# Patient Record
Sex: Female | Born: 1935 | Race: White | Hispanic: No | Smoking: Never smoker
Health system: Southern US, Community
[De-identification: ages and names within clinical notes are randomized; demographics above are authoritative.]

## PROBLEM LIST (undated history)

## (undated) DIAGNOSIS — Z8619 Personal history of other infectious and parasitic diseases: Secondary | ICD-10-CM

## (undated) DIAGNOSIS — D3502 Benign neoplasm of left adrenal gland: Secondary | ICD-10-CM

## (undated) DIAGNOSIS — R7989 Other specified abnormal findings of blood chemistry: Secondary | ICD-10-CM

## (undated) DIAGNOSIS — D649 Anemia, unspecified: Secondary | ICD-10-CM

## (undated) DIAGNOSIS — N184 Chronic kidney disease, stage 4 (severe): Secondary | ICD-10-CM

## (undated) DIAGNOSIS — K219 Gastro-esophageal reflux disease without esophagitis: Secondary | ICD-10-CM

## (undated) DIAGNOSIS — K449 Diaphragmatic hernia without obstruction or gangrene: Secondary | ICD-10-CM

## (undated) DIAGNOSIS — M48061 Spinal stenosis, lumbar region without neurogenic claudication: Secondary | ICD-10-CM

## (undated) DIAGNOSIS — J302 Other seasonal allergic rhinitis: Secondary | ICD-10-CM

## (undated) DIAGNOSIS — K529 Noninfective gastroenteritis and colitis, unspecified: Secondary | ICD-10-CM

## (undated) DIAGNOSIS — N2889 Other specified disorders of kidney and ureter: Secondary | ICD-10-CM

## (undated) DIAGNOSIS — I10 Essential (primary) hypertension: Secondary | ICD-10-CM

## (undated) DIAGNOSIS — Z8719 Personal history of other diseases of the digestive system: Secondary | ICD-10-CM

## (undated) DIAGNOSIS — M810 Age-related osteoporosis without current pathological fracture: Secondary | ICD-10-CM

## (undated) DIAGNOSIS — B9681 Helicobacter pylori [H. pylori] as the cause of diseases classified elsewhere: Secondary | ICD-10-CM

## (undated) DIAGNOSIS — N289 Disorder of kidney and ureter, unspecified: Secondary | ICD-10-CM

## (undated) DIAGNOSIS — M150 Primary generalized (osteo)arthritis: Secondary | ICD-10-CM

## (undated) DIAGNOSIS — H9319 Tinnitus, unspecified ear: Secondary | ICD-10-CM

## (undated) DIAGNOSIS — G629 Polyneuropathy, unspecified: Secondary | ICD-10-CM

## (undated) DIAGNOSIS — K2289 Other specified disease of esophagus: Secondary | ICD-10-CM

## (undated) DIAGNOSIS — T8859XA Other complications of anesthesia, initial encounter: Secondary | ICD-10-CM

## (undated) HISTORY — PX: SHOULDER SURGERY: SHX246

## (undated) HISTORY — DX: Age-related osteoporosis without current pathological fracture: M81.0

## (undated) HISTORY — DX: Other seasonal allergic rhinitis: J30.2

## (undated) HISTORY — DX: Other specified abnormal findings of blood chemistry: R79.89

## (undated) HISTORY — PX: COLONOSCOPY: SHX174

## (undated) HISTORY — DX: Other specified disease of esophagus: K22.89

## (undated) HISTORY — PX: GANGLION CYST EXCISION: SHX1691

## (undated) HISTORY — DX: Diaphragmatic hernia without obstruction or gangrene: K44.9

## (undated) HISTORY — PX: OTHER SURGICAL HISTORY: SHX169

## (undated) HISTORY — DX: Helicobacter pylori (H. pylori) as the cause of diseases classified elsewhere: B96.81

## (undated) HISTORY — PX: VAGINAL HYSTERECTOMY: SUR661

## (undated) HISTORY — DX: Disorder of kidney and ureter, unspecified: N28.9

---

## 2004-04-22 ENCOUNTER — Ambulatory Visit: Payer: Self-pay | Admitting: Internal Medicine

## 2005-12-20 ENCOUNTER — Ambulatory Visit: Payer: Self-pay | Admitting: Internal Medicine

## 2007-02-20 ENCOUNTER — Ambulatory Visit: Payer: Self-pay | Admitting: Internal Medicine

## 2008-10-21 ENCOUNTER — Ambulatory Visit: Payer: Self-pay | Admitting: Internal Medicine

## 2009-08-06 ENCOUNTER — Ambulatory Visit: Payer: Self-pay | Admitting: Unknown Physician Specialty

## 2009-09-15 ENCOUNTER — Ambulatory Visit: Payer: Self-pay | Admitting: Unknown Physician Specialty

## 2010-10-25 ENCOUNTER — Ambulatory Visit: Payer: Self-pay | Admitting: Internal Medicine

## 2011-10-26 ENCOUNTER — Ambulatory Visit: Payer: Self-pay

## 2011-11-11 IMAGING — CT CT NECK WITH CONTRAST
2 of 4 series · 7 of 14 positions shown, 8 images · non-contrast
Comparison: none

REASON FOR EXAM: sialoadenitis
COMMENTS:

[Series 3: soft tissue with · axial · 0.53mm/px · z∈[+256,+412]mm · 4 of 88 slices shown, 5 images (1 of 2)]
[im 18/88  soft-tissue]
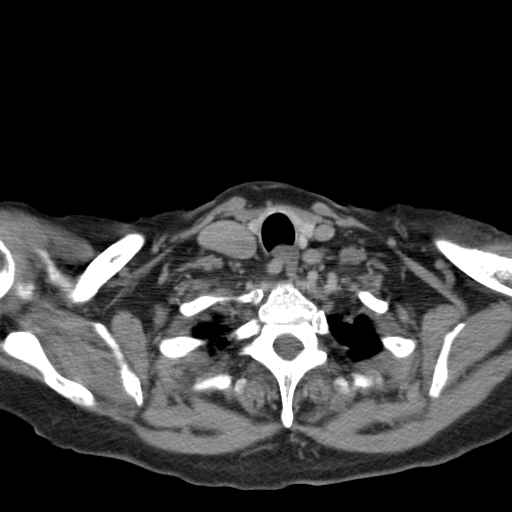
[im 18/88  bone]
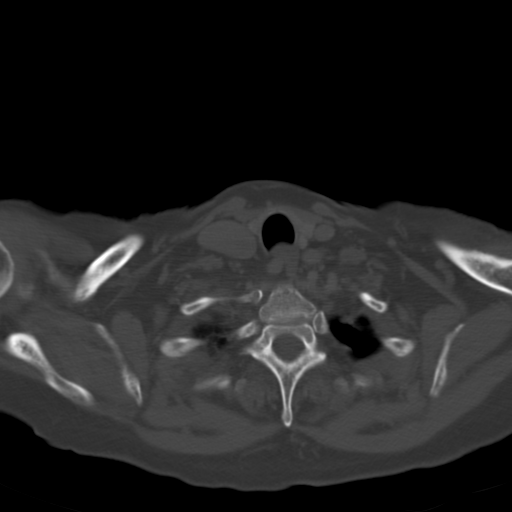
[im 35/88  bone]
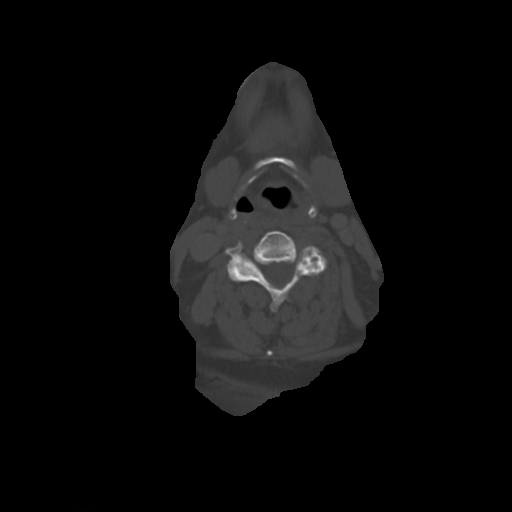
[im 53/88  bone]
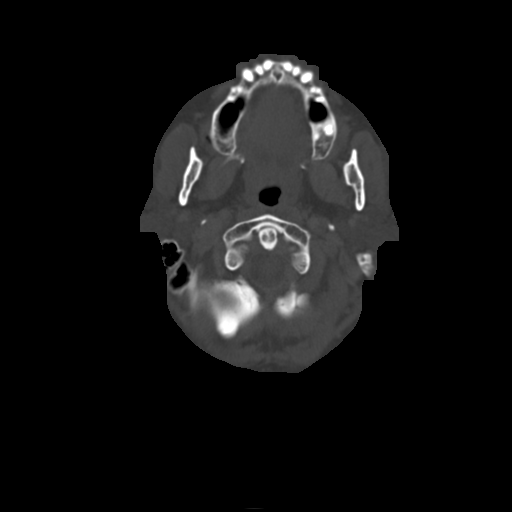
[im 70/88  bone]
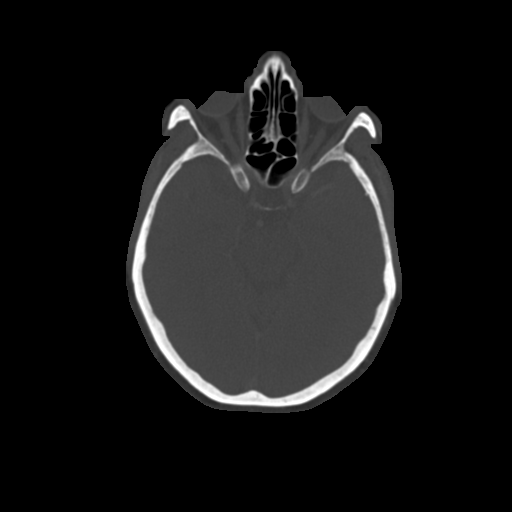

[Series 7: soft tissue with · axial · 0.53mm/px · z∈[+268,+400]mm · 3 of 86 slices shown (2 of 2)]
[im 22/86  soft-tissue]
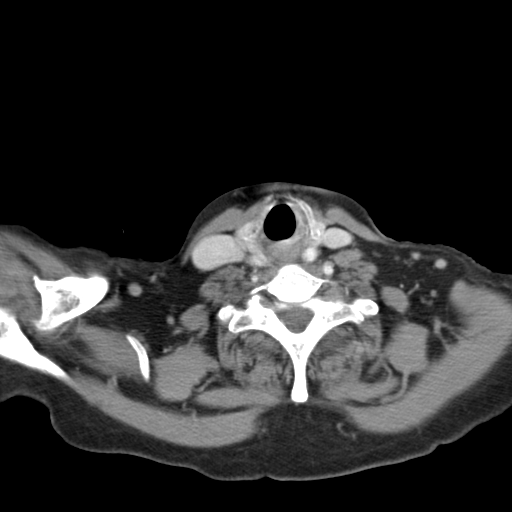
[im 43/86  soft-tissue]
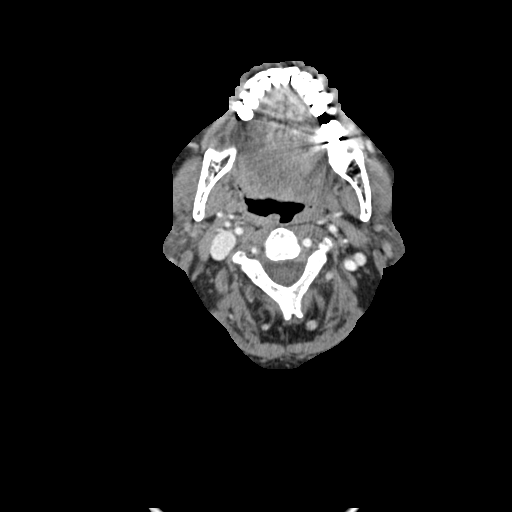
[im 64/86  soft-tissue]
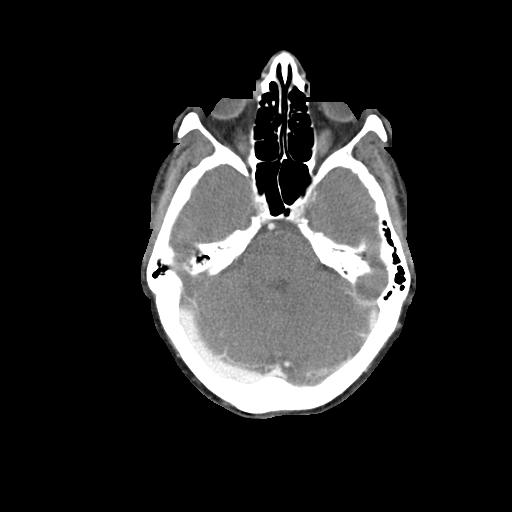

[7 of 14 positions shown; findings below may reference images not displayed]

PROCEDURE:     CT  - CT NECK WITH CONTRAST  - August 06, 2009  [DATE]

RESULT:     Non-contrast CT of the neck is performed in the standard fashion
and reconstructed at soft tissue windows in the axial plane at 3 mm slice
thickness. Subsequently scanning was performed with 70 ml of Jsovue-ROD
iodinated intravenous contrast and reconstructed at 3 mm slice thickness.
There is no previous exam for comparison.

There is a right submandibular duct stone measuring 7.0 x 3.3 mm. No
additional stones or groups of stones are identified.

Images obtained following contrast administration show a normal appearance
of the parotid and submandibular glands without significant inflammatory
stranding. The prevertebral and parapharyngeal soft tissues are normal. The
base of the brain included on the exam is unremarkable. The orbits and
sinuses appear within normal limits. Nonenlarged cervical lymph nodes are
seen bilaterally. The right internal jugular is dominant with a small left
internal jugular visualized. The included images of the thyroid show normal
enhancement without a discrete mass or abnormal calcification. The superior
mediastinum is unremarkable. The lung apices appear within normal limits.
IMPRESSION: Right submandibular gland duct stone or stones.

## 2012-11-02 ENCOUNTER — Ambulatory Visit: Payer: Self-pay

## 2014-01-01 DIAGNOSIS — R7989 Other specified abnormal findings of blood chemistry: Secondary | ICD-10-CM | POA: Insufficient documentation

## 2014-01-29 ENCOUNTER — Ambulatory Visit: Payer: Self-pay | Admitting: Internal Medicine

## 2015-01-06 ENCOUNTER — Other Ambulatory Visit: Payer: Self-pay | Admitting: Internal Medicine

## 2015-01-06 DIAGNOSIS — Z1231 Encounter for screening mammogram for malignant neoplasm of breast: Secondary | ICD-10-CM

## 2015-02-02 ENCOUNTER — Ambulatory Visit
Admission: RE | Admit: 2015-02-02 | Discharge: 2015-02-02 | Disposition: A | Discharge status: Home / Self Care | Payer: PPO | Source: Ambulatory Visit | Attending: Internal Medicine | Admitting: Internal Medicine

## 2015-02-02 DIAGNOSIS — Z1231 Encounter for screening mammogram for malignant neoplasm of breast: Secondary | ICD-10-CM | POA: Diagnosis not present

## 2016-01-13 DIAGNOSIS — H2513 Age-related nuclear cataract, bilateral: Secondary | ICD-10-CM | POA: Diagnosis not present

## 2016-01-22 DIAGNOSIS — I10 Essential (primary) hypertension: Secondary | ICD-10-CM | POA: Diagnosis not present

## 2016-01-22 DIAGNOSIS — Z0000 Encounter for general adult medical examination without abnormal findings: Secondary | ICD-10-CM | POA: Diagnosis not present

## 2016-01-22 DIAGNOSIS — E559 Vitamin D deficiency, unspecified: Secondary | ICD-10-CM | POA: Diagnosis not present

## 2016-01-22 DIAGNOSIS — E538 Deficiency of other specified B group vitamins: Secondary | ICD-10-CM | POA: Diagnosis not present

## 2016-01-22 DIAGNOSIS — Z139 Encounter for screening, unspecified: Secondary | ICD-10-CM | POA: Diagnosis not present

## 2016-01-26 DIAGNOSIS — E559 Vitamin D deficiency, unspecified: Secondary | ICD-10-CM | POA: Diagnosis not present

## 2016-01-26 DIAGNOSIS — Z0000 Encounter for general adult medical examination without abnormal findings: Secondary | ICD-10-CM | POA: Diagnosis not present

## 2016-01-26 DIAGNOSIS — I10 Essential (primary) hypertension: Secondary | ICD-10-CM | POA: Diagnosis not present

## 2016-06-21 DIAGNOSIS — M5441 Lumbago with sciatica, right side: Secondary | ICD-10-CM | POA: Diagnosis not present

## 2016-06-21 DIAGNOSIS — M9903 Segmental and somatic dysfunction of lumbar region: Secondary | ICD-10-CM | POA: Diagnosis not present

## 2016-06-21 DIAGNOSIS — M9901 Segmental and somatic dysfunction of cervical region: Secondary | ICD-10-CM | POA: Diagnosis not present

## 2016-06-21 DIAGNOSIS — M542 Cervicalgia: Secondary | ICD-10-CM | POA: Diagnosis not present

## 2016-06-23 DIAGNOSIS — M5441 Lumbago with sciatica, right side: Secondary | ICD-10-CM | POA: Diagnosis not present

## 2016-06-23 DIAGNOSIS — M542 Cervicalgia: Secondary | ICD-10-CM | POA: Diagnosis not present

## 2016-06-23 DIAGNOSIS — M9901 Segmental and somatic dysfunction of cervical region: Secondary | ICD-10-CM | POA: Diagnosis not present

## 2016-06-23 DIAGNOSIS — M9903 Segmental and somatic dysfunction of lumbar region: Secondary | ICD-10-CM | POA: Diagnosis not present

## 2016-06-29 DIAGNOSIS — M9901 Segmental and somatic dysfunction of cervical region: Secondary | ICD-10-CM | POA: Diagnosis not present

## 2016-06-29 DIAGNOSIS — M542 Cervicalgia: Secondary | ICD-10-CM | POA: Diagnosis not present

## 2016-06-29 DIAGNOSIS — M9903 Segmental and somatic dysfunction of lumbar region: Secondary | ICD-10-CM | POA: Diagnosis not present

## 2016-06-29 DIAGNOSIS — M5441 Lumbago with sciatica, right side: Secondary | ICD-10-CM | POA: Diagnosis not present

## 2016-07-05 DIAGNOSIS — M9903 Segmental and somatic dysfunction of lumbar region: Secondary | ICD-10-CM | POA: Diagnosis not present

## 2016-07-05 DIAGNOSIS — M5441 Lumbago with sciatica, right side: Secondary | ICD-10-CM | POA: Diagnosis not present

## 2016-07-05 DIAGNOSIS — M9901 Segmental and somatic dysfunction of cervical region: Secondary | ICD-10-CM | POA: Diagnosis not present

## 2016-07-05 DIAGNOSIS — M542 Cervicalgia: Secondary | ICD-10-CM | POA: Diagnosis not present

## 2016-07-07 DIAGNOSIS — M9903 Segmental and somatic dysfunction of lumbar region: Secondary | ICD-10-CM | POA: Diagnosis not present

## 2016-07-07 DIAGNOSIS — M9901 Segmental and somatic dysfunction of cervical region: Secondary | ICD-10-CM | POA: Diagnosis not present

## 2016-07-07 DIAGNOSIS — M5441 Lumbago with sciatica, right side: Secondary | ICD-10-CM | POA: Diagnosis not present

## 2016-07-07 DIAGNOSIS — M542 Cervicalgia: Secondary | ICD-10-CM | POA: Diagnosis not present

## 2016-07-14 DIAGNOSIS — M542 Cervicalgia: Secondary | ICD-10-CM | POA: Diagnosis not present

## 2016-07-14 DIAGNOSIS — M5441 Lumbago with sciatica, right side: Secondary | ICD-10-CM | POA: Diagnosis not present

## 2016-07-14 DIAGNOSIS — M9901 Segmental and somatic dysfunction of cervical region: Secondary | ICD-10-CM | POA: Diagnosis not present

## 2016-07-14 DIAGNOSIS — M9903 Segmental and somatic dysfunction of lumbar region: Secondary | ICD-10-CM | POA: Diagnosis not present

## 2016-07-25 DIAGNOSIS — M5441 Lumbago with sciatica, right side: Secondary | ICD-10-CM | POA: Diagnosis not present

## 2016-07-25 DIAGNOSIS — M9903 Segmental and somatic dysfunction of lumbar region: Secondary | ICD-10-CM | POA: Diagnosis not present

## 2016-07-25 DIAGNOSIS — M542 Cervicalgia: Secondary | ICD-10-CM | POA: Diagnosis not present

## 2016-07-25 DIAGNOSIS — M9901 Segmental and somatic dysfunction of cervical region: Secondary | ICD-10-CM | POA: Diagnosis not present

## 2016-08-02 DIAGNOSIS — M5441 Lumbago with sciatica, right side: Secondary | ICD-10-CM | POA: Diagnosis not present

## 2016-08-02 DIAGNOSIS — M542 Cervicalgia: Secondary | ICD-10-CM | POA: Diagnosis not present

## 2016-08-02 DIAGNOSIS — M9903 Segmental and somatic dysfunction of lumbar region: Secondary | ICD-10-CM | POA: Diagnosis not present

## 2016-08-02 DIAGNOSIS — M9901 Segmental and somatic dysfunction of cervical region: Secondary | ICD-10-CM | POA: Diagnosis not present

## 2016-08-09 DIAGNOSIS — M9901 Segmental and somatic dysfunction of cervical region: Secondary | ICD-10-CM | POA: Diagnosis not present

## 2016-08-09 DIAGNOSIS — M5441 Lumbago with sciatica, right side: Secondary | ICD-10-CM | POA: Diagnosis not present

## 2016-08-09 DIAGNOSIS — M9903 Segmental and somatic dysfunction of lumbar region: Secondary | ICD-10-CM | POA: Diagnosis not present

## 2016-08-09 DIAGNOSIS — M542 Cervicalgia: Secondary | ICD-10-CM | POA: Diagnosis not present

## 2016-08-16 DIAGNOSIS — M9901 Segmental and somatic dysfunction of cervical region: Secondary | ICD-10-CM | POA: Diagnosis not present

## 2016-08-16 DIAGNOSIS — M9903 Segmental and somatic dysfunction of lumbar region: Secondary | ICD-10-CM | POA: Diagnosis not present

## 2016-08-16 DIAGNOSIS — M542 Cervicalgia: Secondary | ICD-10-CM | POA: Diagnosis not present

## 2016-08-16 DIAGNOSIS — M5441 Lumbago with sciatica, right side: Secondary | ICD-10-CM | POA: Diagnosis not present

## 2016-08-23 DIAGNOSIS — M5441 Lumbago with sciatica, right side: Secondary | ICD-10-CM | POA: Diagnosis not present

## 2016-08-23 DIAGNOSIS — M542 Cervicalgia: Secondary | ICD-10-CM | POA: Diagnosis not present

## 2016-08-23 DIAGNOSIS — M9903 Segmental and somatic dysfunction of lumbar region: Secondary | ICD-10-CM | POA: Diagnosis not present

## 2016-08-23 DIAGNOSIS — M9901 Segmental and somatic dysfunction of cervical region: Secondary | ICD-10-CM | POA: Diagnosis not present

## 2016-11-21 DIAGNOSIS — M5134 Other intervertebral disc degeneration, thoracic region: Secondary | ICD-10-CM | POA: Diagnosis not present

## 2016-11-21 DIAGNOSIS — M5136 Other intervertebral disc degeneration, lumbar region: Secondary | ICD-10-CM | POA: Diagnosis not present

## 2016-11-21 DIAGNOSIS — M1611 Unilateral primary osteoarthritis, right hip: Secondary | ICD-10-CM | POA: Diagnosis not present

## 2016-11-21 DIAGNOSIS — M5416 Radiculopathy, lumbar region: Secondary | ICD-10-CM | POA: Diagnosis not present

## 2016-12-24 DIAGNOSIS — R21 Rash and other nonspecific skin eruption: Secondary | ICD-10-CM | POA: Diagnosis not present

## 2017-01-04 DIAGNOSIS — M2560 Stiffness of unspecified joint, not elsewhere classified: Secondary | ICD-10-CM | POA: Diagnosis not present

## 2017-01-04 DIAGNOSIS — M5441 Lumbago with sciatica, right side: Secondary | ICD-10-CM | POA: Diagnosis not present

## 2017-01-04 DIAGNOSIS — M5442 Lumbago with sciatica, left side: Secondary | ICD-10-CM | POA: Diagnosis not present

## 2017-01-04 DIAGNOSIS — M6281 Muscle weakness (generalized): Secondary | ICD-10-CM | POA: Diagnosis not present

## 2017-01-04 DIAGNOSIS — M5136 Other intervertebral disc degeneration, lumbar region: Secondary | ICD-10-CM | POA: Diagnosis not present

## 2017-01-10 DIAGNOSIS — M5136 Other intervertebral disc degeneration, lumbar region: Secondary | ICD-10-CM | POA: Diagnosis not present

## 2017-01-13 DIAGNOSIS — M5136 Other intervertebral disc degeneration, lumbar region: Secondary | ICD-10-CM | POA: Diagnosis not present

## 2017-01-17 DIAGNOSIS — M5136 Other intervertebral disc degeneration, lumbar region: Secondary | ICD-10-CM | POA: Diagnosis not present

## 2017-01-20 DIAGNOSIS — M5136 Other intervertebral disc degeneration, lumbar region: Secondary | ICD-10-CM | POA: Diagnosis not present

## 2017-01-24 DIAGNOSIS — R7989 Other specified abnormal findings of blood chemistry: Secondary | ICD-10-CM | POA: Diagnosis not present

## 2017-01-24 DIAGNOSIS — Z0000 Encounter for general adult medical examination without abnormal findings: Secondary | ICD-10-CM | POA: Diagnosis not present

## 2017-01-24 DIAGNOSIS — I10 Essential (primary) hypertension: Secondary | ICD-10-CM | POA: Diagnosis not present

## 2017-01-24 DIAGNOSIS — M5136 Other intervertebral disc degeneration, lumbar region: Secondary | ICD-10-CM | POA: Diagnosis not present

## 2017-01-31 DIAGNOSIS — M5136 Other intervertebral disc degeneration, lumbar region: Secondary | ICD-10-CM | POA: Diagnosis not present

## 2017-02-03 DIAGNOSIS — M5136 Other intervertebral disc degeneration, lumbar region: Secondary | ICD-10-CM | POA: Diagnosis not present

## 2017-02-15 DIAGNOSIS — R7989 Other specified abnormal findings of blood chemistry: Secondary | ICD-10-CM | POA: Diagnosis not present

## 2017-02-15 DIAGNOSIS — Z23 Encounter for immunization: Secondary | ICD-10-CM | POA: Diagnosis not present

## 2017-02-15 DIAGNOSIS — D649 Anemia, unspecified: Secondary | ICD-10-CM | POA: Diagnosis not present

## 2017-02-15 DIAGNOSIS — I10 Essential (primary) hypertension: Secondary | ICD-10-CM | POA: Diagnosis not present

## 2017-02-15 DIAGNOSIS — Z0000 Encounter for general adult medical examination without abnormal findings: Secondary | ICD-10-CM | POA: Diagnosis not present

## 2017-03-03 DIAGNOSIS — H2513 Age-related nuclear cataract, bilateral: Secondary | ICD-10-CM | POA: Diagnosis not present

## 2018-10-13 ENCOUNTER — Emergency Department: Payer: Medicare HMO

## 2018-10-13 ENCOUNTER — Other Ambulatory Visit: Payer: Self-pay

## 2018-10-13 ENCOUNTER — Emergency Department
Admission: EM | Admit: 2018-10-13 | Discharge: 2018-10-13 | Disposition: A | Discharge status: Home / Self Care | Payer: Medicare HMO | Attending: Emergency Medicine | Admitting: Emergency Medicine

## 2018-10-13 ENCOUNTER — Encounter: Payer: Self-pay | Admitting: Emergency Medicine

## 2018-10-13 DIAGNOSIS — R05 Cough: Secondary | ICD-10-CM

## 2018-10-13 DIAGNOSIS — I10 Essential (primary) hypertension: Secondary | ICD-10-CM | POA: Diagnosis not present

## 2018-10-13 DIAGNOSIS — K219 Gastro-esophageal reflux disease without esophagitis: Secondary | ICD-10-CM

## 2018-10-13 DIAGNOSIS — R079 Chest pain, unspecified: Secondary | ICD-10-CM

## 2018-10-13 DIAGNOSIS — R1013 Epigastric pain: Secondary | ICD-10-CM | POA: Diagnosis present

## 2018-10-13 DIAGNOSIS — R059 Cough, unspecified: Secondary | ICD-10-CM

## 2018-10-13 HISTORY — DX: Essential (primary) hypertension: I10

## 2018-10-13 LAB — HEPATIC FUNCTION PANEL
ALT: 17 U/L (ref 0–44)
AST: 21 U/L (ref 15–41)
Albumin: 4.1 g/dL (ref 3.5–5.0)
Alkaline Phosphatase: 59 U/L (ref 38–126)
Bilirubin, Direct: <0.1 mg/dL (ref 0.0–0.2)
Total Bilirubin: 0.5 mg/dL (ref 0.3–1.2)
Total Protein: 7.5 g/dL (ref 6.5–8.1)

## 2018-10-13 LAB — TROPONIN I (HIGH SENSITIVITY)
Troponin I (High Sensitivity): 7 ng/L (ref <18)
Troponin I (High Sensitivity): 7 ng/L (ref <18)

## 2018-10-13 LAB — CBC
HCT: 36.9 % (ref 36.0–46.0)
Hemoglobin: 12.3 g/dL (ref 12.0–15.0)
MCH: 29.7 pg (ref 26.0–34.0)
MCHC: 33.3 g/dL (ref 30.0–36.0)
MCV: 89.1 fL (ref 80.0–100.0)
Platelets: 298 10*3/uL (ref 150–400)
RBC: 4.14 MIL/uL (ref 3.87–5.11)
RDW: 14 % (ref 11.5–15.5)
WBC: 7.1 10*3/uL (ref 4.0–10.5)
nRBC: 0 % (ref 0.0–0.2)

## 2018-10-13 LAB — BASIC METABOLIC PANEL
Anion gap: 10 (ref 5–15)
BUN: 23 mg/dL (ref 8–23)
CO2: 25 mmol/L (ref 22–32)
Calcium: 9.6 mg/dL (ref 8.9–10.3)
Chloride: 106 mmol/L (ref 98–111)
Creatinine, Ser: 1.46 mg/dL — ABNORMAL HIGH (ref 0.44–1.00)
GFR calc Af Amer: 39 mL/min — ABNORMAL LOW (ref >60)
GFR calc non Af Amer: 33 mL/min — ABNORMAL LOW (ref >60)
Glucose, Bld: 113 mg/dL — ABNORMAL HIGH (ref 70–99)
Potassium: 3 mmol/L — ABNORMAL LOW (ref 3.5–5.1)
Sodium: 141 mmol/L (ref 135–145)

## 2018-10-13 LAB — LIPASE, BLOOD: Lipase: 27 U/L (ref 11–51)

## 2018-10-13 MED ORDER — PANTOPRAZOLE SODIUM 20 MG PO TBEC: 20.0000 mg | DELAYED_RELEASE_TABLET | Freq: Every day | ORAL | 1 refills | Status: DC

## 2018-10-13 NOTE — ED Notes (Signed)
Pt ambulated without assistance to the bathroom.

## 2018-10-13 NOTE — ED Provider Notes (Signed)
[facility] Emergency Department Provider Note  Time seen: 3:21 PM  I have reviewed the triage vital signs and the nursing notes.   HISTORY  Chief Complaint Chest Pain   HPI Teresa Johns is a 84 y.o. female with a past medical history of hypertension presents to the emergency department for chest discomfort/epigastric discomfort.  According to the patient she suffers from reflux, will occasionally take Pepto-Bismol or Rolaids for relief.  Patient states Thursday she was having reflux type symptoms, spit up several times and had an episode of loose stool as well per patient.  States she took Rolaids but did not feel significant relief.  Then overnight last night she developed some symptoms once again.  Patient states she stays alone and got "nervous."  She states her mother had bad reflux and blamed her reflux on her chest pain when her mother actually had a heart attack.  Patient states she became very anxious that she could be having a heart attack and not just reflux so she went to [facility] [facility] and was referred to the emergency department.  Currently she denies any discomfort.  Denies any shortness of breath at any point.  Denies any recent fever or cough.  Patient states on occasion she will have some discomfort in her epigastrium but denies any currently.  Is not taking anything for reflux on a daily basis.   Past Medical History:  Diagnosis Date  . Hypertension     There are no active problems to display for this patient.   Past Surgical History:  Procedure Laterality Date  . SHOULDER SURGERY Right   . VAGINAL HYSTERECTOMY     partial    Prior to Admission medications   Not on File    No Known Allergies  Family History  Problem Relation Age of Onset  . Breast cancer Maternal Aunt 60    Social History Social History   Tobacco Use  . Smoking status: Never Smoker  . Smokeless tobacco: Never Used  Substance Use Topics  . Alcohol use: Never     Frequency: Never  . Drug use: Never    Review of Systems Constitutional: Negative for fever. Cardiovascular: Mild chest discomfort Thursday and last night.  None currently. Respiratory: Negative for shortness of breath. Gastrointestinal: Mild upper abdominal discomfort.  Positive for nausea vomiting diarrhea on Thursday. Genitourinary: Negative for urinary compaints Musculoskeletal: Negative for musculoskeletal complaints Skin: Negative for skin complaints  Neurological: Negative for headache All other ROS negative  ____________________________________________   PHYSICAL EXAM:  VITAL SIGNS: ED Triage Vitals  Enc Vitals Group     BP 10/13/18 1243 (!) 159/70     Pulse Rate 10/13/18 1243 98     Resp 10/13/18 1243 16     Temp 10/13/18 1243 98.3 F (36.8 C)     Temp Source 10/13/18 1243 Oral     SpO2 10/13/18 1243 98 %     Weight 10/13/18 1244 160 lb (72.6 kg)     Height 10/13/18 1244 5\' 3"  (1.6 m)     Head Circumference --      Peak Flow --      Pain Score 10/13/18 1244 2     Pain Loc --      Pain Edu? --      Excl. in [place]? --    Constitutional: Alert and oriented. Well appearing and in no distress. Eyes: Normal exam ENT      Head: Normocephalic and atraumatic.  Mouth/Throat: Mucous membranes are moist. Cardiovascular: Normal rate, regular rhythm. No murmur Respiratory: Normal respiratory effort without tachypnea nor retractions. Breath sounds are clear Gastrointestinal: Soft and nontender. No distention.   Musculoskeletal: Nontender with normal range of motion in all extremities.  Neurologic:  Normal speech and language. No gross focal neurologic deficits  Skin:  Skin is warm, dry and intact.  Psychiatric: Mood and affect are normal.   ____________________________________________    EKG  EKG viewed and interpreted by myself shows a normal sinus rhythm at 98 bpm with a narrow QRS, normal axis, normal intervals, no concerning ST  changes.  ____________________________________________    RADIOLOGY  Chest x-ray appears well  ____________________________________________   INITIAL IMPRESSION / ASSESSMENT AND PLAN / ED COURSE  Pertinent labs & imaging results that were available during my care of the patient were reviewed by me and considered in my medical decision making (see chart for details).   Patient presents to the emergency department for intermittent chest/epigastric discomfort.  History of reflux symptoms.  Patient was concerned that it could have been a heart attack.  Is asking me to be discharged home however the patient is agreeable to stay for a second troponin.  Currently the patient appears well denies any symptoms at this time.  Patient's initial lab work including cardiac enzymes are negative.  Differential would include ACS, gastritis, reflux, pancreatitis gallbladder dysfunction, peptic ulcer disease.  I have added on LFTs and a lipase to the patient's lab work including a second troponin.  If the patient's labs result normal anticipate likely discharge home with PCP follow-up.  I did discuss placing the patient on a PPI for her reflux symptoms, the patient is agreeable to plan of care.  Labs including repeat troponin hepatic function panel and lipase are largely within normal limits.  Overall the patient appears extremely well denies any symptoms at this time.  We will discharge the patient home with PCP follow-up.  We will place on a PPI.  Patient agreeable to plan of care.  Teresa Johns was evaluated in Emergency Department on 10/13/2018 for the symptoms described in the history of present illness. She was evaluated in the context of the global COVID-19 pandemic, which necessitated consideration that the patient might be at risk for infection with the SARS-CoV-2 virus that causes COVID-19. Institutional protocols and algorithms that pertain to the evaluation of patients at risk for COVID-19 are in a  state of rapid change based on information released by regulatory bodies including the CDC and federal and state organizations. These policies and algorithms were followed during the patient's care in the ED.  ____________________________________________   FINAL CLINICAL IMPRESSION(S) / ED DIAGNOSES Chest pain Reflux   Minna AntisPaduchowski, Kenetha Cozza, MD 10/13/18 1635

## 2018-10-13 NOTE — ED Notes (Signed)
First Nurse Note: Pt to ED from [facility]. For chest pain/pressure. Pt is in NAD.

## 2018-10-13 NOTE — ED Triage Notes (Signed)
Pt to ED from home c/o mid chest pain since Wednesday with pressure feeling and indigestion, nausea with vomiting.  Also states pain to mid back, denies cardiac hx or thinners. Speaking in complete and coherent sentences, chest rise even and unlabored, in NAD at this time.

## 2019-07-04 DIAGNOSIS — M810 Age-related osteoporosis without current pathological fracture: Secondary | ICD-10-CM | POA: Insufficient documentation

## 2019-07-04 DIAGNOSIS — J302 Other seasonal allergic rhinitis: Secondary | ICD-10-CM | POA: Insufficient documentation

## 2019-09-06 ENCOUNTER — Ambulatory Visit: Payer: Medicare HMO | Admitting: Podiatry

## 2019-09-06 ENCOUNTER — Other Ambulatory Visit: Payer: Self-pay

## 2019-09-06 ENCOUNTER — Encounter: Payer: Self-pay | Admitting: Podiatry

## 2019-09-06 VITALS — BP 158/86 | HR 92 | Ht 63.0 in | Wt 162.0 lb

## 2019-09-06 DIAGNOSIS — L600 Ingrowing nail: Secondary | ICD-10-CM

## 2019-09-06 DIAGNOSIS — I10 Essential (primary) hypertension: Secondary | ICD-10-CM | POA: Insufficient documentation

## 2019-09-06 MED ORDER — GENTAMICIN SULFATE 0.1 % EX CREA: 1.0000 "application " | TOPICAL_CREAM | Freq: Two times a day (BID) | CUTANEOUS | 1 refills | Status: DC

## 2019-09-06 NOTE — Progress Notes (Signed)
° °  Subjective: Patient presents today for evaluation of pain to the lateral borders of bilateral great toes. Patient is concerned for possible ingrown nail.  Patient states that she has been dealing with this off and on for several years now.  Normally she trims the nails out however this last time she has been unable to and she has had increased pain and tenderness to the bilateral great toes.  Right greater than the left.  Patient presents today for further treatment and evaluation.  Past Medical History:  Diagnosis Date   Hypertension     Objective:  General: Well developed, nourished, in no acute distress, alert and oriented x3   Dermatology: Skin is warm, dry and supple bilateral.  Lateral border of the bilateral great toes appears to be erythematous with evidence of an ingrowing nail. Pain on palpation noted to the border of the nail fold. The remaining nails appear unremarkable at this time. There are no open sores, lesions.  Vascular: Dorsalis Pedis artery and Posterior Tibial artery pedal pulses palpable. No lower extremity edema noted.   Neruologic: Grossly intact via light touch bilateral.  Musculoskeletal: Muscular strength within normal limits in all groups bilateral. Normal range of motion noted to all pedal and ankle joints.   Assesement: #1 Paronychia with ingrowing nail lateral border bilateral great toes #2 Pain in toe #3 Incurvated nail  Plan of Care:  1. Patient evaluated.  2. Discussed treatment alternatives and plan of care. Explained nail avulsion procedure and post procedure course to patient. 3. Patient opted for permanent partial nail avulsion of the lateral border bilateral great toes.  4. Prior to procedure, local anesthesia infiltration utilized using 3 ml of a 50:50 mixture of 2% plain lidocaine and 0.5% plain marcaine in a normal hallux block fashion and a betadine prep performed.  5. Partial permanent nail avulsion with chemical matrixectomy performed  using 3x30sec applications of phenol followed by alcohol flush.  6. Light dressing applied. 7.  Prescription for gentamicin cream  8.  Return to clinic 2 weeks.  Felecia Shelling, DPM [facility]  Dr. Felecia Shelling, DPM    [address]                                        [place], [place] [address]                Office [phone]  Fax [phone]

## 2019-09-24 ENCOUNTER — Ambulatory Visit: Payer: Medicare HMO | Admitting: Podiatry

## 2019-09-24 ENCOUNTER — Other Ambulatory Visit: Payer: Self-pay

## 2019-09-24 DIAGNOSIS — L600 Ingrowing nail: Secondary | ICD-10-CM | POA: Diagnosis not present

## 2019-09-24 NOTE — Progress Notes (Signed)
   Subjective: 85 y.o. female presents today status post permanent nail avulsion procedure of the lateral border of the bilateral great toes that was performed on 09/06/2019.  Patient states she is doing much better.  She has been soaking her feet and applying antibiotic cream as directed.  No new complaints at this time  Past Medical History:  Diagnosis Date  . Hypertension     Objective: Skin is warm, dry and supple. Nail and respective nail fold appears to be healing appropriately. Open wound to the associated nail fold with a granular wound base and moderate amount of fibrotic tissue. Minimal drainage noted. Mild erythema around the periungual region likely due to phenol chemical matricectomy.  Assessment: #1 postop permanent partial nail avulsion lateral border bilateral great toes #2 open wound periungual nail fold of respective digit.   Plan of care: #1 patient was evaluated  #2 debridement of open wound was performed to the periungual border of the respective toe using a currette. Antibiotic ointment and Band-Aid was applied. #3 patient is to return to clinic on a PRN basis.   Felecia Shelling, DPM [facility]  Dr. Felecia Shelling, DPM    [address]                                        [place], [place] [address]                Office [phone]  Fax [phone]

## 2020-07-06 ENCOUNTER — Other Ambulatory Visit: Payer: Self-pay

## 2020-07-06 ENCOUNTER — Other Ambulatory Visit: Payer: Self-pay | Admitting: Internal Medicine

## 2020-07-06 ENCOUNTER — Ambulatory Visit
Admission: RE | Admit: 2020-07-06 | Discharge: 2020-07-06 | Disposition: A | Discharge status: Home / Self Care | Payer: Medicare HMO | Source: Ambulatory Visit | Attending: Internal Medicine | Admitting: Internal Medicine

## 2020-07-06 DIAGNOSIS — M7989 Other specified soft tissue disorders: Secondary | ICD-10-CM | POA: Diagnosis present

## 2020-07-06 DIAGNOSIS — M79605 Pain in left leg: Secondary | ICD-10-CM | POA: Diagnosis not present

## 2020-07-07 ENCOUNTER — Other Ambulatory Visit: Payer: Self-pay | Admitting: Internal Medicine

## 2020-07-07 DIAGNOSIS — M79605 Pain in left leg: Secondary | ICD-10-CM

## 2020-07-07 DIAGNOSIS — M7989 Other specified soft tissue disorders: Secondary | ICD-10-CM

## 2020-10-14 ENCOUNTER — Other Ambulatory Visit (HOSPITAL_COMMUNITY): Payer: Self-pay | Admitting: Physician Assistant

## 2020-10-14 ENCOUNTER — Other Ambulatory Visit: Payer: Self-pay | Admitting: Physician Assistant

## 2020-10-14 DIAGNOSIS — R1310 Dysphagia, unspecified: Secondary | ICD-10-CM

## 2020-10-22 ENCOUNTER — Other Ambulatory Visit: Payer: Self-pay

## 2020-10-22 ENCOUNTER — Ambulatory Visit
Admission: RE | Admit: 2020-10-22 | Discharge: 2020-10-22 | Disposition: A | Discharge status: Home / Self Care | Payer: Medicare HMO | Source: Ambulatory Visit | Attending: Physician Assistant | Admitting: Physician Assistant

## 2020-10-22 DIAGNOSIS — R1310 Dysphagia, unspecified: Secondary | ICD-10-CM | POA: Insufficient documentation

## 2020-11-19 ENCOUNTER — Encounter: Payer: Self-pay | Admitting: *Deleted

## 2020-11-20 ENCOUNTER — Ambulatory Visit
Admission: RE | Admit: 2020-11-20 | Discharge: 2020-11-20 | Disposition: A | Discharge status: Home / Self Care | Payer: Medicare HMO | Attending: Gastroenterology | Admitting: Gastroenterology

## 2020-11-20 ENCOUNTER — Ambulatory Visit: Payer: Medicare HMO | Admitting: Registered Nurse

## 2020-11-20 ENCOUNTER — Encounter
Admission: RE | Disposition: A | Discharge status: Home / Self Care | Payer: Self-pay | Source: Home / Self Care | Attending: Gastroenterology

## 2020-11-20 ENCOUNTER — Encounter: Payer: Self-pay | Admitting: *Deleted

## 2020-11-20 ENCOUNTER — Other Ambulatory Visit: Payer: Self-pay

## 2020-11-20 DIAGNOSIS — K2950 Unspecified chronic gastritis without bleeding: Secondary | ICD-10-CM | POA: Insufficient documentation

## 2020-11-20 DIAGNOSIS — K224 Dyskinesia of esophagus: Secondary | ICD-10-CM | POA: Diagnosis not present

## 2020-11-20 DIAGNOSIS — K449 Diaphragmatic hernia without obstruction or gangrene: Secondary | ICD-10-CM | POA: Diagnosis not present

## 2020-11-20 DIAGNOSIS — B9681 Helicobacter pylori [H. pylori] as the cause of diseases classified elsewhere: Secondary | ICD-10-CM | POA: Insufficient documentation

## 2020-11-20 DIAGNOSIS — R634 Abnormal weight loss: Secondary | ICD-10-CM | POA: Diagnosis present

## 2020-11-20 DIAGNOSIS — Z79899 Other long term (current) drug therapy: Secondary | ICD-10-CM | POA: Diagnosis not present

## 2020-11-20 DIAGNOSIS — R1310 Dysphagia, unspecified: Secondary | ICD-10-CM | POA: Diagnosis present

## 2020-11-20 HISTORY — DX: Noninfective gastroenteritis and colitis, unspecified: K52.9

## 2020-11-20 HISTORY — PX: ESOPHAGOGASTRODUODENOSCOPY: SHX5428

## 2020-11-20 HISTORY — DX: Tinnitus, unspecified ear: H93.19

## 2020-11-20 HISTORY — DX: Personal history of other infectious and parasitic diseases: Z86.19

## 2020-11-20 SURGERY — EGD (ESOPHAGOGASTRODUODENOSCOPY)
Anesthesia: General

## 2020-11-20 MED ORDER — LIDOCAINE HCL (CARDIAC) PF 100 MG/5ML IV SOSY
PREFILLED_SYRINGE | INTRAVENOUS | Status: DC | PRN
  Administered 2020-11-20: 40 mg via INTRAVENOUS

## 2020-11-20 MED ORDER — SODIUM CHLORIDE 0.9 % IV SOLN: INTRAVENOUS | Status: DC

## 2020-11-20 MED ORDER — PROPOFOL 10 MG/ML IV BOLUS
INTRAVENOUS | Status: DC | PRN
  Administered 2020-11-20: 10 mg via INTRAVENOUS
  Administered 2020-11-20: 70 mg via INTRAVENOUS

## 2020-11-20 MED ORDER — PROPOFOL 500 MG/50ML IV EMUL
INTRAVENOUS | Status: DC | PRN
  Administered 2020-11-20: 150 ug/kg/min via INTRAVENOUS

## 2020-11-20 NOTE — Anesthesia Postprocedure Evaluation (Signed)
Anesthesia Post Note  Patient: Teresa Johns  Procedure(s) Performed: ESOPHAGOGASTRODUODENOSCOPY (EGD)  Patient location during evaluation: Endoscopy Anesthesia Type: General Level of consciousness: awake and alert Pain management: pain level controlled Vital Signs Assessment: post-procedure vital signs reviewed and stable Respiratory status: spontaneous breathing, nonlabored ventilation, respiratory function stable and patient connected to nasal cannula oxygen Cardiovascular status: blood pressure returned to baseline and stable Postop Assessment: no apparent nausea or vomiting Anesthetic complications: no   No notable events documented.   Last Vitals:  Vitals:   11/20/20 1126 11/20/20 1211  BP: 130/60 (!) 97/52  Pulse: 97 74  Resp: 20 18  Temp: (!) 35.9 C (!) 35.9 C  SpO2: 98% 97%    Last Pain:  Vitals:   11/20/20 1211  TempSrc: Temporal  PainSc: 0-No pain                 Johny Blamer

## 2020-11-20 NOTE — Interval H&P Note (Signed)
History and Physical Interval Note: Preprocedure H&P from 11/20/20  was reviewed and there was no interval change after seeing and examining the patient.  Written consent was obtained from the patient after discussion of risks, benefits, and alternatives. Patient has consented to proceed with Esophagogastroduodenoscopy with possible intervention   11/20/2020 11:46 AM  Teresa Johns  has presented today for surgery, with the diagnosis of Abnormal weight loss (R63.4) Dysphagia, unspecified type (R13.10) Gastroesophageal reflux disease, unspecified whether esophagitis present (K21.9).  The various methods of treatment have been discussed with the patient and family. After consideration of risks, benefits and other options for treatment, the patient has consented to  Procedure(s): ESOPHAGOGASTRODUODENOSCOPY (EGD) (N/A) as a surgical intervention.  The patient's history has been reviewed, patient examined, no change in status, stable for surgery.  I have reviewed the patient's chart and labs.  Questions were answered to the patient's satisfaction.     Jaynie Collins

## 2020-11-20 NOTE — Op Note (Addendum)
[facility] Gastroenterology Patient Name: Teresa Johns Procedure Date: 11/20/2020 11:35 AM MRN: [id number] Account #: [id number] Date of Birth: 1934/08/21 Admit Type: Outpatient Age: 86 Room: [facility] ENDO ROOM 1 Gender: Female Note Status: Finalized Instrument Name: Upper Endoscope [id number] Procedure:             Upper GI endoscopy Indications:           Dysphagia Providers:             Jaynie Collins DO, DO Referring MD:          Barbette Reichmann, MD (Referring MD) Medicines:             Monitored Anesthesia Care Complications:         No immediate complications. Estimated blood loss: None. Procedure:             Pre-Anesthesia Assessment:                        - Prior to the procedure, a History and Physical was                         performed, and patient medications and allergies were                         reviewed. The patient is competent. The risks and                         benefits of the procedure and the sedation options and                         risks were discussed with the patient. All questions                         were answered and informed consent was obtained.                         Patient identification and proposed procedure were                         verified by the physician, the nurse, the anesthetist                         and the technician in the pre-procedure area. Mental                         Status Examination: alert and oriented. Airway                         Examination: normal oropharyngeal airway and neck                         mobility. Respiratory Examination: clear to                         auscultation. CV Examination: RRR, no murmurs, no S3                         or S4. Prophylactic Antibiotics: The patient does not  require prophylactic antibiotics. Prior                         Anticoagulants: The patient has taken no previous                         anticoagulant or  antiplatelet agents. ASA Grade                         Assessment: II - A patient with mild systemic disease.                         After reviewing the risks and benefits, the patient                         was deemed in satisfactory condition to undergo the                         procedure. The anesthesia plan was to use monitored                         anesthesia care (MAC). Immediately prior to                         administration of medications, the patient was                         re-assessed for adequacy to receive sedatives. The                         heart rate, respiratory rate, oxygen saturations,                         blood pressure, adequacy of pulmonary ventilation, and                         response to care were monitored throughout the                         procedure. The physical status of the patient was                         re-assessed after the procedure.                        After obtaining informed consent, the endoscope was                         passed under direct vision. Throughout the procedure,                         the patient's blood pressure, pulse, and oxygen                         saturations were monitored continuously. The                         Endosonoscope was introduced through the mouth, and  advanced to the second part of duodenum. The upper GI                         endoscopy was accomplished without difficulty. The                         patient tolerated the procedure well. Findings:      The duodenal bulb, first portion of the duodenum and second portion of       the duodenum were normal. Estimated blood loss: none.      Localized moderate inflammation characterized by congestion (edema) was       found in the entire examined stomach. Biopsies were taken with a cold       forceps for Helicobacter pylori testing. Biopsies were taken with a cold       forceps for histology. Estimated blood loss  was minimal.      A medium-sized hiatal hernia was present. Estimated blood loss: none.      The exam of the stomach was otherwise normal.      The Z-line was regular and was found 33 cm from the incisors. Estimated       blood loss: none.      Esophagogastric landmarks were identified: the gastroesophageal junction       was found at 33 cm from the incisors.      Abnormal motility was noted in the esophagus. The cricopharyngeus was       normal. There is spasticity of the esophageal body. The distal       esophagus/lower esophageal sphincter is open. Tertiary peristaltic waves       are noted. A guidewire was placed and the scope was withdrawn. Dilation       was performed with a Savary dilator with no resistance at 51 Fr. The       dilation site was examined and showed no change. Estimated blood loss:       none.      Normal mucosa was found in the entire esophagus. Impression:            - Normal duodenal bulb, first portion of the duodenum                         and second portion of the duodenum.                        - Gastritis. Biopsied.                        - Medium-sized hiatal hernia.                        - Z-line regular, 33 cm from the incisors.                        - Esophagogastric landmarks identified.                        - Abnormal esophageal motility, consistent with                         presbyesophagus. Dilated.                        -  Normal mucosa was found in the entire esophagus. Recommendation:        - Discharge patient to home.                        - Soft diet, moisten and chew food well.                        - Continue present medications.                        - Use a proton pump inhibitor PO daily.                        - Await pathology results.                        - Return to my office as previously scheduled. Procedure Code(s):     --- Professional ---                        [phone], Esophagogastroduodenoscopy, flexible,                          transoral; with insertion of guide wire followed by                         passage of dilator(s) through esophagus over guide wire                        [id number], 59, Esophagogastroduodenoscopy, flexible,                         transoral; with biopsy, single or multiple Diagnosis Code(s):     --- Professional ---                        K29.70, Gastritis, unspecified, without bleeding                        K44.9, Diaphragmatic hernia without obstruction or                         gangrene                        K22.4, Dyskinesia of esophagus                        R13.10, Dysphagia, unspecified CPT copyright 2019 American Medical Association. All rights reserved. The codes documented in this report are preliminary and upon coder review may  be revised to meet current compliance requirements. Attending Participation:      I personally performed the entire procedure. Elfredia Nevins, DO Jaynie Collins DO, DO 11/20/2020 12:13:05 PM This report has been signed electronically. Number of Addenda: 0 Note Initiated On: 11/20/2020 11:35 AM Estimated Blood Loss:  Estimated blood loss was minimal.      [facility]

## 2020-11-20 NOTE — H&P (Signed)
[facility] Pre-Procedure H&P   Patient ID: Teresa Johns is a 86 y.o. female.  Gastroenterology Provider: Jaynie Collins, DO  PCP: Barbette Reichmann, MD  Date: 11/20/2020  HPI Ms. Kaleia Teresa Johns is a 86 y.o. female who presents today for Esophagogastroduodenoscopy for dysphagia, abdominal pain, weight loss.  Does note heartburn but was not taking ppi previously. Dysphagia to solids and large pills, but not liquids. No odynophagia. Weight loss of 8 lbs.  No other acute gi complaints.   Past Medical History:  Diagnosis Date   Colitis    History of chicken pox    Hypertension    Tinnitus     Past Surgical History:  Procedure Laterality Date   GANGLION CYST EXCISION     PAROTID STONE REMOVAL     SHOULDER SURGERY Right    VAGINAL HYSTERECTOMY     partial    Family History No h/o GI disease or malignancy  Review of Systems  Constitutional:  Positive for unexpected weight change (8 lbs lost). Negative for activity change, appetite change, fatigue and fever.  HENT:  Positive for trouble swallowing. Negative for voice change.   Respiratory:  Negative for shortness of breath and wheezing.   Cardiovascular:  Negative for chest pain and palpitations.  Gastrointestinal:  Positive for abdominal pain (epigastric, gnawing). Negative for abdominal distention, anal bleeding, blood in stool, constipation, diarrhea, nausea, rectal pain and vomiting.  Musculoskeletal:  Negative for arthralgias and myalgias.  Skin:  Negative for color change and pallor.  Neurological:  Negative for dizziness, syncope and weakness.  Psychiatric/Behavioral:  Negative for confusion.   All other systems reviewed and are negative.   Medications No current facility-administered medications on file prior to encounter.   Current Outpatient Medications on File Prior to Encounter  Medication Sig Dispense Refill   amLODipine (NORVASC) 10 MG tablet Take 10 mg by mouth 1 day or 1 dose.      busPIRone (BUSPAR) 5 MG tablet Take 5 mg by mouth 2 (two) times daily.     calcium carbonate (OSCAL) 1500 (600 Ca) MG TABS tablet Take 1,500 mg by mouth 2 (two) times daily with a meal.     Cholecalciferol 25 MCG (1000 UT) tablet Take by mouth.     Cyanocobalamin (VITAMIN B 12) 500 MCG TABS Take 1,000 mg by mouth daily.     gentamicin cream (GARAMYCIN) 0.1 % Apply 1 application topically 2 (two) times daily. 30 g 1   hydrochlorothiazide (HYDRODIURIL) 12.5 MG tablet Take 12.5 mg by mouth daily.     Multiple Vitamin (MULTI-VITAMIN) tablet Take 1 tablet by mouth daily.     ondansetron (ZOFRAN) 4 MG tablet Take 4 mg by mouth every 8 (eight) hours as needed for nausea or vomiting.     Vitamins-Lipotropics (BALANCED B-50) TABS Take 1 tablet by mouth daily.     cefUROXime (CEFTIN) 250 MG tablet Take 250 mg by mouth 2 (two) times daily. (Patient not taking: Reported on 11/20/2020)     fluticasone (FLONASE) 50 MCG/ACT nasal spray Place into the nose.     gabapentin (NEURONTIN) 100 MG capsule Take 50 mg by mouth 2 (two) times daily.     Ibuprofen 200 MG CAPS Take 600 mg by mouth once. (Patient not taking: Reported on 11/20/2020)     iron polysaccharides (NIFEREX) 150 MG capsule Take by mouth.     losartan (COZAAR) 50 MG tablet Take 50 mg by mouth daily. (Patient not taking: Reported on 11/20/2020)  pantoprazole (PROTONIX) 20 MG tablet Take 1 tablet (20 mg total) by mouth daily. 30 tablet 1   triamterene-hydrochlorothiazide (DYAZIDE) 37.5-25 MG capsule Take 1 capsule by mouth every morning.      Pertinent medications related to GI and procedure were reviewed by me with the patient prior to the procedure   Current Facility-Administered Medications:    0.9 %  sodium chloride infusion, , Intravenous, Continuous, Jaynie Collins, DO  sodium chloride         No Known Allergies Allergies were reviewed by me prior to the procedure  Objective    Vitals:   11/20/20 1126  BP: 130/60  Pulse: 97   Resp: 20  Temp: (!) 96.7 F (35.9 C)  TempSrc: Temporal  SpO2: 98%  Weight: 69.3 kg  Height: 5\' 3"  (1.6 m)     Physical Exam Vitals reviewed.  Constitutional:      General: She is not in acute distress.    Appearance: Normal appearance. She is not ill-appearing, toxic-appearing or diaphoretic.  HENT:     Head: Normocephalic and atraumatic.     Nose: Nose normal.     Mouth/Throat:     Mouth: Mucous membranes are moist.     Pharynx: Oropharynx is clear.  Eyes:     General: No scleral icterus.    Extraocular Movements: Extraocular movements intact.  Cardiovascular:     Rate and Rhythm: Normal rate and regular rhythm.     Heart sounds: Normal heart sounds. No murmur heard.   No friction rub. No gallop.  Pulmonary:     Effort: Pulmonary effort is normal. No respiratory distress.     Breath sounds: Normal breath sounds. No wheezing, rhonchi or rales.  Abdominal:     General: Bowel sounds are normal. There is no distension.     Palpations: Abdomen is soft.     Tenderness: There is no abdominal tenderness. There is no guarding or rebound.  Musculoskeletal:     Cervical back: Neck supple.     Right lower leg: No edema.     Left lower leg: No edema.  Skin:    General: Skin is warm and dry.     Coloration: Skin is not jaundiced or pale.  Neurological:     General: No focal deficit present.     Mental Status: She is alert and oriented to person, place, and time. Mental status is at baseline.  Psychiatric:        Mood and Affect: Mood normal.        Behavior: Behavior normal.        Thought Content: Thought content normal.        Judgment: Judgment normal.     Assessment:  [name] is a 86 y.o. female  who presents today for Esophagogastroduodenoscopy for dysphagia, abdominal pain, weight loss.  Plan:  Esophagogastroduodenoscopy with possible intervention today.  Esophagogastroduodenoscopy with possible biopsy, control of bleeding, polypectomy, and  interventions as necessary has been discussed with the patient/patient representative. Informed consent was obtained from the patient/patient representative after explaining the indication, nature, and risks of the procedure including but not limited to death, bleeding, perforation, missed neoplasm/lesions, cardiorespiratory compromise, and reaction to medications. Opportunity for questions was given and appropriate answers were provided. Patient/patient representative has verbalized understanding is amenable to undergoing the procedure.   91, DO  [facility]  Portions of the record may have been created with voice recognition software. Occasional wrong-word or 'sound-a-like' substitutions may  have occurred due to the inherent limitations of voice recognition software.  Read the chart carefully and recognize, using context, where substitutions may have occurred.

## 2020-11-20 NOTE — Anesthesia Preprocedure Evaluation (Addendum)
Anesthesia Evaluation  Patient identified by MRN, date of birth, ID band Patient awake    Reviewed: Allergy & Precautions, NPO status , Patient's Chart, lab work & pertinent test results  Airway Mallampati: III  TM Distance: >3 FB Neck ROM: full    Dental no notable dental hx. (+) Chipped   Pulmonary neg pulmonary ROS,    Pulmonary exam normal        Cardiovascular hypertension, negative cardio ROS Normal cardiovascular exam     Neuro/Psych negative neurological ROS  negative psych ROS   GI/Hepatic negative GI ROS, Neg liver ROS,   Endo/Other  negative endocrine ROS  Renal/GU negative Renal ROS  negative genitourinary   Musculoskeletal   Abdominal   Peds  Hematology negative hematology ROS (+)   Anesthesia Other Findings Past Medical History: No date: Colitis No date: History of chicken pox No date: Hypertension No date: Tinnitus  Past Surgical History: No date: GANGLION CYST EXCISION No date: PAROTID STONE REMOVAL No date: SHOULDER SURGERY; Right No date: VAGINAL HYSTERECTOMY     Comment:  partial     Reproductive/Obstetrics negative OB ROS                            Anesthesia Physical Anesthesia Plan  ASA: 2  Anesthesia Plan: General   Post-op Pain Management:    Induction: Intravenous  PONV Risk Score and Plan: Propofol infusion and TIVA  Airway Management Planned: Natural Airway and Nasal Cannula  Additional Equipment:   Intra-op Plan:   Post-operative Plan:   Informed Consent: I have reviewed the patients History and Physical, chart, labs and discussed the procedure including the risks, benefits and alternatives for the proposed anesthesia with the patient or authorized representative who has indicated his/her understanding and acceptance.     Dental Advisory Given  Plan Discussed with: Anesthesiologist, CRNA and Surgeon  Anesthesia Plan Comments:  (Patient consented for risks of anesthesia including but not limited to:  - adverse reactions to medications - risk of airway placement if required - damage to eyes, teeth, lips or other oral mucosa - nerve damage due to positioning  - sore throat or hoarseness - Damage to heart, brain, nerves, lungs, other parts of body or loss of life  Patient voiced understanding.)        Anesthesia Quick Evaluation

## 2020-11-20 NOTE — Transfer of Care (Signed)
Immediate Anesthesia Transfer of Care Note  Patient: Teresa Johns  Procedure(s) Performed: ESOPHAGOGASTRODUODENOSCOPY (EGD)  Patient Location: PACU and Endoscopy Unit  Anesthesia Type:General  Level of Consciousness: awake, alert  and oriented  Airway & Oxygen Therapy: Patient Spontanous Breathing  Post-op Assessment: Report given to RN and Post -op Vital signs reviewed and stable  Post vital signs: Reviewed and stable  Last Vitals:  Vitals Value Taken Time  BP 97/52 11/20/20 1211  Temp 35.9 C 11/20/20 1211  Pulse 74 11/20/20 1211  Resp 18 11/20/20 1211  SpO2 97 % 11/20/20 1211    Last Pain:  Vitals:   11/20/20 1211  TempSrc: Temporal  PainSc: 0-No pain         Complications: No notable events documented.

## 2020-11-23 LAB — SURGICAL PATHOLOGY

## 2021-03-16 DIAGNOSIS — R634 Abnormal weight loss: Secondary | ICD-10-CM | POA: Diagnosis not present

## 2021-03-16 DIAGNOSIS — A048 Other specified bacterial intestinal infections: Secondary | ICD-10-CM | POA: Diagnosis not present

## 2021-03-16 DIAGNOSIS — K224 Dyskinesia of esophagus: Secondary | ICD-10-CM | POA: Diagnosis not present

## 2021-03-16 DIAGNOSIS — K219 Gastro-esophageal reflux disease without esophagitis: Secondary | ICD-10-CM | POA: Diagnosis not present

## 2021-03-16 DIAGNOSIS — K5900 Constipation, unspecified: Secondary | ICD-10-CM | POA: Diagnosis not present

## 2021-03-16 DIAGNOSIS — R1310 Dysphagia, unspecified: Secondary | ICD-10-CM | POA: Diagnosis not present

## 2021-04-01 DIAGNOSIS — A048 Other specified bacterial intestinal infections: Secondary | ICD-10-CM | POA: Diagnosis not present

## 2021-04-09 DIAGNOSIS — K449 Diaphragmatic hernia without obstruction or gangrene: Secondary | ICD-10-CM | POA: Diagnosis not present

## 2021-04-09 DIAGNOSIS — R8290 Unspecified abnormal findings in urine: Secondary | ICD-10-CM | POA: Diagnosis not present

## 2021-04-09 DIAGNOSIS — R7301 Impaired fasting glucose: Secondary | ICD-10-CM | POA: Diagnosis not present

## 2021-04-09 DIAGNOSIS — K2289 Other specified disease of esophagus: Secondary | ICD-10-CM | POA: Diagnosis not present

## 2021-04-09 DIAGNOSIS — J302 Other seasonal allergic rhinitis: Secondary | ICD-10-CM | POA: Diagnosis not present

## 2021-04-09 DIAGNOSIS — B9681 Helicobacter pylori [H. pylori] as the cause of diseases classified elsewhere: Secondary | ICD-10-CM | POA: Diagnosis not present

## 2021-04-09 DIAGNOSIS — R7989 Other specified abnormal findings of blood chemistry: Secondary | ICD-10-CM | POA: Diagnosis not present

## 2021-04-09 DIAGNOSIS — K219 Gastro-esophageal reflux disease without esophagitis: Secondary | ICD-10-CM | POA: Diagnosis not present

## 2021-04-09 DIAGNOSIS — M1711 Unilateral primary osteoarthritis, right knee: Secondary | ICD-10-CM | POA: Diagnosis not present

## 2021-04-09 DIAGNOSIS — M5412 Radiculopathy, cervical region: Secondary | ICD-10-CM | POA: Diagnosis not present

## 2021-04-09 DIAGNOSIS — I10 Essential (primary) hypertension: Secondary | ICD-10-CM | POA: Diagnosis not present

## 2021-04-09 DIAGNOSIS — K2970 Gastritis, unspecified, without bleeding: Secondary | ICD-10-CM | POA: Diagnosis not present

## 2021-04-16 DIAGNOSIS — M1711 Unilateral primary osteoarthritis, right knee: Secondary | ICD-10-CM | POA: Diagnosis not present

## 2021-04-16 DIAGNOSIS — N1832 Chronic kidney disease, stage 3b: Secondary | ICD-10-CM | POA: Diagnosis not present

## 2021-04-16 DIAGNOSIS — R5381 Other malaise: Secondary | ICD-10-CM | POA: Diagnosis not present

## 2021-04-16 DIAGNOSIS — K219 Gastro-esophageal reflux disease without esophagitis: Secondary | ICD-10-CM | POA: Diagnosis not present

## 2021-04-16 DIAGNOSIS — R5383 Other fatigue: Secondary | ICD-10-CM | POA: Diagnosis not present

## 2021-04-16 DIAGNOSIS — I10 Essential (primary) hypertension: Secondary | ICD-10-CM | POA: Diagnosis not present

## 2021-04-16 DIAGNOSIS — R7309 Other abnormal glucose: Secondary | ICD-10-CM | POA: Diagnosis not present

## 2021-04-16 DIAGNOSIS — A048 Other specified bacterial intestinal infections: Secondary | ICD-10-CM | POA: Diagnosis not present

## 2021-04-16 DIAGNOSIS — K449 Diaphragmatic hernia without obstruction or gangrene: Secondary | ICD-10-CM | POA: Diagnosis not present

## 2021-04-16 DIAGNOSIS — F330 Major depressive disorder, recurrent, mild: Secondary | ICD-10-CM | POA: Diagnosis not present

## 2021-04-16 DIAGNOSIS — L298 Other pruritus: Secondary | ICD-10-CM | POA: Diagnosis not present

## 2021-07-19 DIAGNOSIS — H43813 Vitreous degeneration, bilateral: Secondary | ICD-10-CM | POA: Diagnosis not present

## 2021-07-19 DIAGNOSIS — M3501 Sicca syndrome with keratoconjunctivitis: Secondary | ICD-10-CM | POA: Diagnosis not present

## 2021-07-19 DIAGNOSIS — H2513 Age-related nuclear cataract, bilateral: Secondary | ICD-10-CM | POA: Diagnosis not present

## 2021-08-13 DIAGNOSIS — R7309 Other abnormal glucose: Secondary | ICD-10-CM | POA: Diagnosis not present

## 2021-08-13 DIAGNOSIS — R8290 Unspecified abnormal findings in urine: Secondary | ICD-10-CM | POA: Diagnosis not present

## 2021-08-13 DIAGNOSIS — K449 Diaphragmatic hernia without obstruction or gangrene: Secondary | ICD-10-CM | POA: Diagnosis not present

## 2021-08-13 DIAGNOSIS — E538 Deficiency of other specified B group vitamins: Secondary | ICD-10-CM | POA: Diagnosis not present

## 2021-08-13 DIAGNOSIS — K219 Gastro-esophageal reflux disease without esophagitis: Secondary | ICD-10-CM | POA: Diagnosis not present

## 2021-08-13 DIAGNOSIS — R5381 Other malaise: Secondary | ICD-10-CM | POA: Diagnosis not present

## 2021-08-13 DIAGNOSIS — N1832 Chronic kidney disease, stage 3b: Secondary | ICD-10-CM | POA: Diagnosis not present

## 2021-08-13 DIAGNOSIS — M1711 Unilateral primary osteoarthritis, right knee: Secondary | ICD-10-CM | POA: Diagnosis not present

## 2021-08-13 DIAGNOSIS — D649 Anemia, unspecified: Secondary | ICD-10-CM | POA: Diagnosis not present

## 2021-08-13 DIAGNOSIS — I10 Essential (primary) hypertension: Secondary | ICD-10-CM | POA: Diagnosis not present

## 2021-08-13 DIAGNOSIS — R5383 Other fatigue: Secondary | ICD-10-CM | POA: Diagnosis not present

## 2021-08-13 DIAGNOSIS — A048 Other specified bacterial intestinal infections: Secondary | ICD-10-CM | POA: Diagnosis not present

## 2021-08-20 DIAGNOSIS — I10 Essential (primary) hypertension: Secondary | ICD-10-CM | POA: Diagnosis not present

## 2021-08-20 DIAGNOSIS — J302 Other seasonal allergic rhinitis: Secondary | ICD-10-CM | POA: Diagnosis not present

## 2021-08-20 DIAGNOSIS — R790 Abnormal level of blood mineral: Secondary | ICD-10-CM | POA: Diagnosis not present

## 2021-08-20 DIAGNOSIS — N184 Chronic kidney disease, stage 4 (severe): Secondary | ICD-10-CM | POA: Diagnosis not present

## 2021-08-20 DIAGNOSIS — E538 Deficiency of other specified B group vitamins: Secondary | ICD-10-CM | POA: Diagnosis not present

## 2021-08-20 DIAGNOSIS — D649 Anemia, unspecified: Secondary | ICD-10-CM | POA: Diagnosis not present

## 2021-08-20 DIAGNOSIS — M159 Polyosteoarthritis, unspecified: Secondary | ICD-10-CM | POA: Diagnosis not present

## 2021-09-01 DIAGNOSIS — D649 Anemia, unspecified: Secondary | ICD-10-CM | POA: Diagnosis not present

## 2021-09-01 DIAGNOSIS — R790 Abnormal level of blood mineral: Secondary | ICD-10-CM | POA: Diagnosis not present

## 2021-12-14 DIAGNOSIS — Z0000 Encounter for general adult medical examination without abnormal findings: Secondary | ICD-10-CM | POA: Diagnosis not present

## 2021-12-14 DIAGNOSIS — N184 Chronic kidney disease, stage 4 (severe): Secondary | ICD-10-CM | POA: Diagnosis not present

## 2021-12-14 DIAGNOSIS — R790 Abnormal level of blood mineral: Secondary | ICD-10-CM | POA: Diagnosis not present

## 2021-12-14 DIAGNOSIS — E538 Deficiency of other specified B group vitamins: Secondary | ICD-10-CM | POA: Diagnosis not present

## 2021-12-14 DIAGNOSIS — D649 Anemia, unspecified: Secondary | ICD-10-CM | POA: Diagnosis not present

## 2021-12-14 DIAGNOSIS — I10 Essential (primary) hypertension: Secondary | ICD-10-CM | POA: Diagnosis not present

## 2021-12-21 DIAGNOSIS — Z6828 Body mass index (BMI) 28.0-28.9, adult: Secondary | ICD-10-CM | POA: Diagnosis not present

## 2021-12-21 DIAGNOSIS — I10 Essential (primary) hypertension: Secondary | ICD-10-CM | POA: Diagnosis not present

## 2021-12-21 DIAGNOSIS — R7989 Other specified abnormal findings of blood chemistry: Secondary | ICD-10-CM | POA: Diagnosis not present

## 2021-12-21 DIAGNOSIS — D649 Anemia, unspecified: Secondary | ICD-10-CM | POA: Diagnosis not present

## 2021-12-21 DIAGNOSIS — M1711 Unilateral primary osteoarthritis, right knee: Secondary | ICD-10-CM | POA: Diagnosis not present

## 2021-12-21 DIAGNOSIS — Z0000 Encounter for general adult medical examination without abnormal findings: Secondary | ICD-10-CM | POA: Diagnosis not present

## 2021-12-21 DIAGNOSIS — K449 Diaphragmatic hernia without obstruction or gangrene: Secondary | ICD-10-CM | POA: Diagnosis not present

## 2022-03-08 DIAGNOSIS — M9904 Segmental and somatic dysfunction of sacral region: Secondary | ICD-10-CM | POA: Diagnosis not present

## 2022-03-08 DIAGNOSIS — M9903 Segmental and somatic dysfunction of lumbar region: Secondary | ICD-10-CM | POA: Diagnosis not present

## 2022-03-08 DIAGNOSIS — M461 Sacroiliitis, not elsewhere classified: Secondary | ICD-10-CM | POA: Diagnosis not present

## 2022-03-08 DIAGNOSIS — M5441 Lumbago with sciatica, right side: Secondary | ICD-10-CM | POA: Diagnosis not present

## 2022-03-19 DIAGNOSIS — M5416 Radiculopathy, lumbar region: Secondary | ICD-10-CM | POA: Diagnosis not present

## 2022-03-31 DIAGNOSIS — R229 Localized swelling, mass and lump, unspecified: Secondary | ICD-10-CM | POA: Diagnosis not present

## 2022-03-31 DIAGNOSIS — L989 Disorder of the skin and subcutaneous tissue, unspecified: Secondary | ICD-10-CM | POA: Diagnosis not present

## 2022-04-01 DIAGNOSIS — C44622 Squamous cell carcinoma of skin of right upper limb, including shoulder: Secondary | ICD-10-CM | POA: Diagnosis not present

## 2022-04-01 DIAGNOSIS — D485 Neoplasm of uncertain behavior of skin: Secondary | ICD-10-CM | POA: Diagnosis not present

## 2022-04-19 DIAGNOSIS — K449 Diaphragmatic hernia without obstruction or gangrene: Secondary | ICD-10-CM | POA: Diagnosis not present

## 2022-04-19 DIAGNOSIS — R8290 Unspecified abnormal findings in urine: Secondary | ICD-10-CM | POA: Diagnosis not present

## 2022-04-19 DIAGNOSIS — I10 Essential (primary) hypertension: Secondary | ICD-10-CM | POA: Diagnosis not present

## 2022-04-19 DIAGNOSIS — M1711 Unilateral primary osteoarthritis, right knee: Secondary | ICD-10-CM | POA: Diagnosis not present

## 2022-04-19 DIAGNOSIS — D649 Anemia, unspecified: Secondary | ICD-10-CM | POA: Diagnosis not present

## 2022-04-19 DIAGNOSIS — R7989 Other specified abnormal findings of blood chemistry: Secondary | ICD-10-CM | POA: Diagnosis not present

## 2022-04-22 DIAGNOSIS — C44622 Squamous cell carcinoma of skin of right upper limb, including shoulder: Secondary | ICD-10-CM | POA: Diagnosis not present

## 2022-04-22 DIAGNOSIS — L905 Scar conditions and fibrosis of skin: Secondary | ICD-10-CM | POA: Diagnosis not present

## 2022-04-26 ENCOUNTER — Ambulatory Visit
Admission: RE | Admit: 2022-04-26 | Discharge: 2022-04-26 | Disposition: A | Discharge status: Home / Self Care | Payer: No Typology Code available for payment source | Source: Ambulatory Visit | Attending: Internal Medicine | Admitting: Internal Medicine

## 2022-04-26 ENCOUNTER — Other Ambulatory Visit: Payer: Self-pay

## 2022-04-26 DIAGNOSIS — F330 Major depressive disorder, recurrent, mild: Secondary | ICD-10-CM | POA: Diagnosis not present

## 2022-04-26 DIAGNOSIS — K219 Gastro-esophageal reflux disease without esophagitis: Secondary | ICD-10-CM | POA: Diagnosis not present

## 2022-04-26 DIAGNOSIS — M7989 Other specified soft tissue disorders: Secondary | ICD-10-CM

## 2022-04-26 DIAGNOSIS — R202 Paresthesia of skin: Secondary | ICD-10-CM | POA: Insufficient documentation

## 2022-04-26 DIAGNOSIS — I10 Essential (primary) hypertension: Secondary | ICD-10-CM | POA: Diagnosis not present

## 2022-04-26 DIAGNOSIS — N184 Chronic kidney disease, stage 4 (severe): Secondary | ICD-10-CM | POA: Diagnosis not present

## 2022-04-26 DIAGNOSIS — R600 Localized edema: Secondary | ICD-10-CM | POA: Diagnosis not present

## 2022-04-26 DIAGNOSIS — M79661 Pain in right lower leg: Secondary | ICD-10-CM | POA: Insufficient documentation

## 2022-04-26 DIAGNOSIS — M5431 Sciatica, right side: Secondary | ICD-10-CM | POA: Diagnosis not present

## 2022-04-26 DIAGNOSIS — L299 Pruritus, unspecified: Secondary | ICD-10-CM | POA: Diagnosis not present

## 2022-04-26 DIAGNOSIS — E538 Deficiency of other specified B group vitamins: Secondary | ICD-10-CM | POA: Diagnosis not present

## 2022-04-26 DIAGNOSIS — R7989 Other specified abnormal findings of blood chemistry: Secondary | ICD-10-CM | POA: Diagnosis not present

## 2022-05-06 DIAGNOSIS — L720 Epidermal cyst: Secondary | ICD-10-CM | POA: Diagnosis not present

## 2022-05-06 DIAGNOSIS — L578 Other skin changes due to chronic exposure to nonionizing radiation: Secondary | ICD-10-CM | POA: Diagnosis not present

## 2022-05-12 DIAGNOSIS — M9904 Segmental and somatic dysfunction of sacral region: Secondary | ICD-10-CM | POA: Diagnosis not present

## 2022-05-12 DIAGNOSIS — M461 Sacroiliitis, not elsewhere classified: Secondary | ICD-10-CM | POA: Diagnosis not present

## 2022-05-12 DIAGNOSIS — M5441 Lumbago with sciatica, right side: Secondary | ICD-10-CM | POA: Diagnosis not present

## 2022-05-12 DIAGNOSIS — M9903 Segmental and somatic dysfunction of lumbar region: Secondary | ICD-10-CM | POA: Diagnosis not present

## 2022-06-02 DIAGNOSIS — M9904 Segmental and somatic dysfunction of sacral region: Secondary | ICD-10-CM | POA: Diagnosis not present

## 2022-06-02 DIAGNOSIS — M5441 Lumbago with sciatica, right side: Secondary | ICD-10-CM | POA: Diagnosis not present

## 2022-06-02 DIAGNOSIS — M9903 Segmental and somatic dysfunction of lumbar region: Secondary | ICD-10-CM | POA: Diagnosis not present

## 2022-06-02 DIAGNOSIS — M461 Sacroiliitis, not elsewhere classified: Secondary | ICD-10-CM | POA: Diagnosis not present

## 2022-06-09 DIAGNOSIS — M25561 Pain in right knee: Secondary | ICD-10-CM | POA: Diagnosis not present

## 2022-06-09 DIAGNOSIS — M9904 Segmental and somatic dysfunction of sacral region: Secondary | ICD-10-CM | POA: Diagnosis not present

## 2022-06-09 DIAGNOSIS — M461 Sacroiliitis, not elsewhere classified: Secondary | ICD-10-CM | POA: Diagnosis not present

## 2022-06-09 DIAGNOSIS — M9903 Segmental and somatic dysfunction of lumbar region: Secondary | ICD-10-CM | POA: Diagnosis not present

## 2022-06-09 DIAGNOSIS — M5441 Lumbago with sciatica, right side: Secondary | ICD-10-CM | POA: Diagnosis not present

## 2022-06-22 DIAGNOSIS — M5416 Radiculopathy, lumbar region: Secondary | ICD-10-CM | POA: Diagnosis not present

## 2022-06-22 DIAGNOSIS — M25561 Pain in right knee: Secondary | ICD-10-CM | POA: Diagnosis not present

## 2022-06-22 DIAGNOSIS — M1711 Unilateral primary osteoarthritis, right knee: Secondary | ICD-10-CM | POA: Diagnosis not present

## 2022-07-01 DIAGNOSIS — M5136 Other intervertebral disc degeneration, lumbar region: Secondary | ICD-10-CM | POA: Diagnosis not present

## 2022-07-01 DIAGNOSIS — M545 Low back pain, unspecified: Secondary | ICD-10-CM | POA: Diagnosis not present

## 2022-07-01 DIAGNOSIS — M5416 Radiculopathy, lumbar region: Secondary | ICD-10-CM | POA: Diagnosis not present

## 2022-07-01 DIAGNOSIS — M4126 Other idiopathic scoliosis, lumbar region: Secondary | ICD-10-CM | POA: Diagnosis not present

## 2022-07-08 DIAGNOSIS — M5416 Radiculopathy, lumbar region: Secondary | ICD-10-CM | POA: Diagnosis not present

## 2022-07-08 DIAGNOSIS — M48062 Spinal stenosis, lumbar region with neurogenic claudication: Secondary | ICD-10-CM | POA: Diagnosis not present

## 2022-08-02 ENCOUNTER — Other Ambulatory Visit: Payer: Self-pay | Admitting: Family Medicine

## 2022-08-02 DIAGNOSIS — M5416 Radiculopathy, lumbar region: Secondary | ICD-10-CM

## 2022-08-02 DIAGNOSIS — M4807 Spinal stenosis, lumbosacral region: Secondary | ICD-10-CM | POA: Diagnosis not present

## 2022-08-02 DIAGNOSIS — M48062 Spinal stenosis, lumbar region with neurogenic claudication: Secondary | ICD-10-CM | POA: Diagnosis not present

## 2022-08-02 DIAGNOSIS — M5136 Other intervertebral disc degeneration, lumbar region: Secondary | ICD-10-CM | POA: Diagnosis not present

## 2022-08-05 ENCOUNTER — Ambulatory Visit
Admission: RE | Admit: 2022-08-05 | Discharge: 2022-08-05 | Disposition: A | Discharge status: Home / Self Care | Payer: No Typology Code available for payment source | Source: Ambulatory Visit | Attending: Family Medicine | Admitting: Family Medicine

## 2022-08-05 DIAGNOSIS — M545 Low back pain, unspecified: Secondary | ICD-10-CM | POA: Diagnosis not present

## 2022-08-05 DIAGNOSIS — I700 Atherosclerosis of aorta: Secondary | ICD-10-CM | POA: Diagnosis not present

## 2022-08-05 DIAGNOSIS — M48061 Spinal stenosis, lumbar region without neurogenic claudication: Secondary | ICD-10-CM | POA: Diagnosis not present

## 2022-08-05 DIAGNOSIS — M5416 Radiculopathy, lumbar region: Secondary | ICD-10-CM

## 2022-08-05 DIAGNOSIS — M79605 Pain in left leg: Secondary | ICD-10-CM | POA: Diagnosis not present

## 2022-08-17 DIAGNOSIS — M5136 Other intervertebral disc degeneration, lumbar region: Secondary | ICD-10-CM | POA: Diagnosis not present

## 2022-08-17 DIAGNOSIS — M5416 Radiculopathy, lumbar region: Secondary | ICD-10-CM | POA: Diagnosis not present

## 2022-08-17 DIAGNOSIS — M48062 Spinal stenosis, lumbar region with neurogenic claudication: Secondary | ICD-10-CM | POA: Diagnosis not present

## 2022-08-18 DIAGNOSIS — M48062 Spinal stenosis, lumbar region with neurogenic claudication: Secondary | ICD-10-CM | POA: Diagnosis not present

## 2022-08-18 DIAGNOSIS — M5416 Radiculopathy, lumbar region: Secondary | ICD-10-CM | POA: Diagnosis not present

## 2022-09-06 DIAGNOSIS — Z79899 Other long term (current) drug therapy: Secondary | ICD-10-CM | POA: Diagnosis not present

## 2022-09-06 DIAGNOSIS — M79661 Pain in right lower leg: Secondary | ICD-10-CM | POA: Diagnosis not present

## 2022-09-06 DIAGNOSIS — R202 Paresthesia of skin: Secondary | ICD-10-CM | POA: Diagnosis not present

## 2022-09-06 DIAGNOSIS — M7989 Other specified soft tissue disorders: Secondary | ICD-10-CM | POA: Diagnosis not present

## 2022-09-06 DIAGNOSIS — R7309 Other abnormal glucose: Secondary | ICD-10-CM | POA: Diagnosis not present

## 2022-09-06 DIAGNOSIS — K219 Gastro-esophageal reflux disease without esophagitis: Secondary | ICD-10-CM | POA: Diagnosis not present

## 2022-09-06 DIAGNOSIS — M5431 Sciatica, right side: Secondary | ICD-10-CM | POA: Diagnosis not present

## 2022-09-06 DIAGNOSIS — R7989 Other specified abnormal findings of blood chemistry: Secondary | ICD-10-CM | POA: Diagnosis not present

## 2022-09-06 DIAGNOSIS — I10 Essential (primary) hypertension: Secondary | ICD-10-CM | POA: Diagnosis not present

## 2022-09-13 DIAGNOSIS — N289 Disorder of kidney and ureter, unspecified: Secondary | ICD-10-CM | POA: Diagnosis not present

## 2022-09-13 DIAGNOSIS — K449 Diaphragmatic hernia without obstruction or gangrene: Secondary | ICD-10-CM | POA: Diagnosis not present

## 2022-09-13 DIAGNOSIS — M5416 Radiculopathy, lumbar region: Secondary | ICD-10-CM | POA: Diagnosis not present

## 2022-09-13 DIAGNOSIS — M79605 Pain in left leg: Secondary | ICD-10-CM | POA: Diagnosis not present

## 2022-09-13 DIAGNOSIS — M5136 Other intervertebral disc degeneration, lumbar region: Secondary | ICD-10-CM | POA: Diagnosis not present

## 2022-09-13 DIAGNOSIS — M79604 Pain in right leg: Secondary | ICD-10-CM | POA: Diagnosis not present

## 2022-09-13 DIAGNOSIS — E876 Hypokalemia: Secondary | ICD-10-CM | POA: Diagnosis not present

## 2022-09-13 DIAGNOSIS — Z0000 Encounter for general adult medical examination without abnormal findings: Secondary | ICD-10-CM | POA: Diagnosis not present

## 2022-09-13 DIAGNOSIS — I10 Essential (primary) hypertension: Secondary | ICD-10-CM | POA: Diagnosis not present

## 2022-09-13 DIAGNOSIS — R7989 Other specified abnormal findings of blood chemistry: Secondary | ICD-10-CM | POA: Diagnosis not present

## 2022-10-03 DIAGNOSIS — M79602 Pain in left arm: Secondary | ICD-10-CM | POA: Diagnosis not present

## 2022-10-10 DIAGNOSIS — M5416 Radiculopathy, lumbar region: Secondary | ICD-10-CM | POA: Diagnosis not present

## 2022-10-10 DIAGNOSIS — M5136 Other intervertebral disc degeneration, lumbar region: Secondary | ICD-10-CM | POA: Diagnosis not present

## 2022-10-10 DIAGNOSIS — M47812 Spondylosis without myelopathy or radiculopathy, cervical region: Secondary | ICD-10-CM | POA: Diagnosis not present

## 2022-10-10 DIAGNOSIS — M4807 Spinal stenosis, lumbosacral region: Secondary | ICD-10-CM | POA: Diagnosis not present

## 2022-10-10 DIAGNOSIS — M19012 Primary osteoarthritis, left shoulder: Secondary | ICD-10-CM | POA: Diagnosis not present

## 2022-10-10 DIAGNOSIS — M4312 Spondylolisthesis, cervical region: Secondary | ICD-10-CM | POA: Diagnosis not present

## 2022-10-10 DIAGNOSIS — M48062 Spinal stenosis, lumbar region with neurogenic claudication: Secondary | ICD-10-CM | POA: Diagnosis not present

## 2022-10-10 DIAGNOSIS — M5030 Other cervical disc degeneration, unspecified cervical region: Secondary | ICD-10-CM | POA: Diagnosis not present

## 2022-10-11 DIAGNOSIS — Z79891 Long term (current) use of opiate analgesic: Secondary | ICD-10-CM | POA: Diagnosis not present

## 2022-10-11 DIAGNOSIS — M5451 Vertebrogenic low back pain: Secondary | ICD-10-CM | POA: Diagnosis not present

## 2022-10-11 DIAGNOSIS — I129 Hypertensive chronic kidney disease with stage 1 through stage 4 chronic kidney disease, or unspecified chronic kidney disease: Secondary | ICD-10-CM | POA: Diagnosis not present

## 2022-10-11 DIAGNOSIS — M5136 Other intervertebral disc degeneration, lumbar region: Secondary | ICD-10-CM | POA: Diagnosis not present

## 2022-10-11 DIAGNOSIS — N189 Chronic kidney disease, unspecified: Secondary | ICD-10-CM | POA: Diagnosis not present

## 2022-10-11 DIAGNOSIS — G8929 Other chronic pain: Secondary | ICD-10-CM | POA: Diagnosis not present

## 2022-10-11 IMAGING — US US EXTREM LOW VENOUS*L*
1 series · 13 of 24 positions shown · non-contrast
Comparison: None.

CLINICAL DATA: 85-year-old female with a history of left leg pain



[Series 1: us venous img lower uni left (dvt) · portal-venous · 13 of 35 slices shown]
[im 1/35]
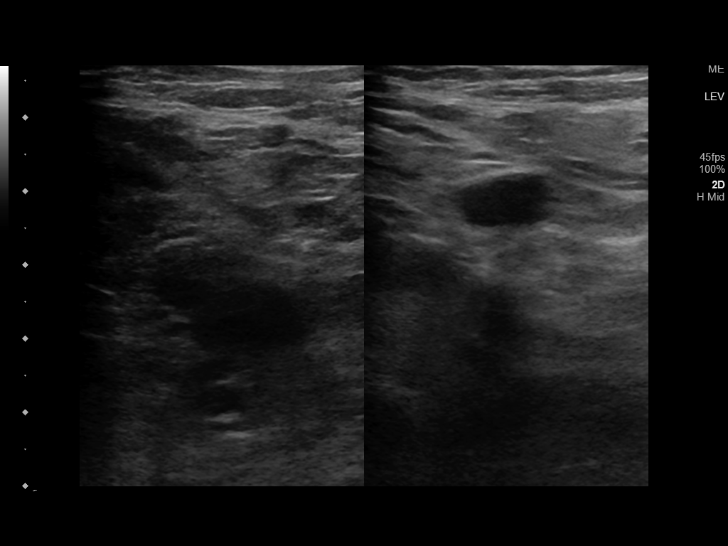
[im 3/35]
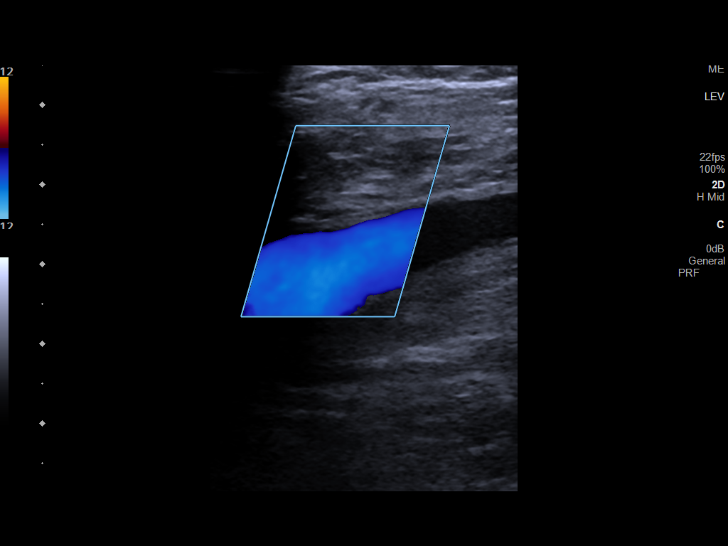
[im 6/35]
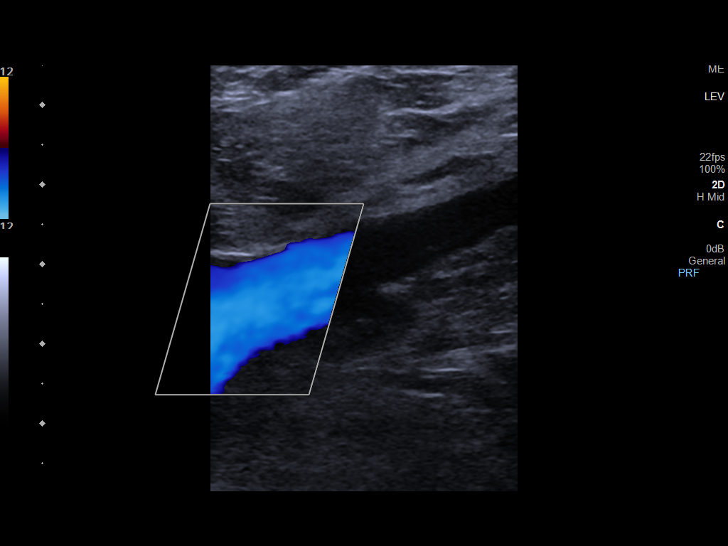
[im 9/35]
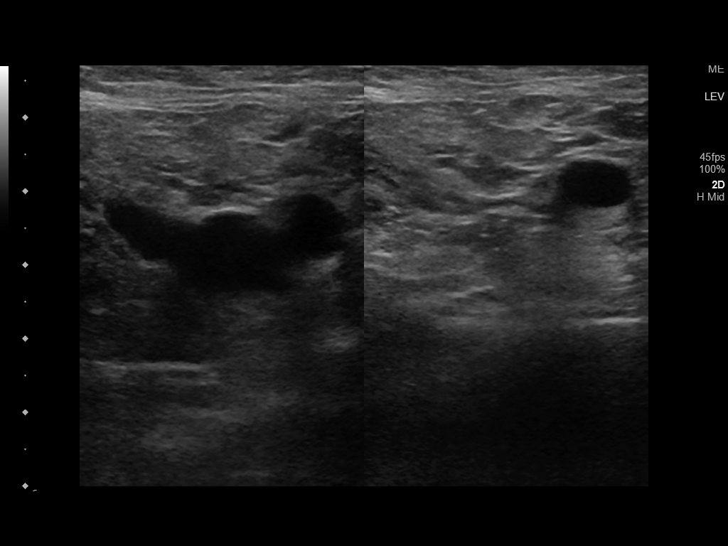
[im 12/35]
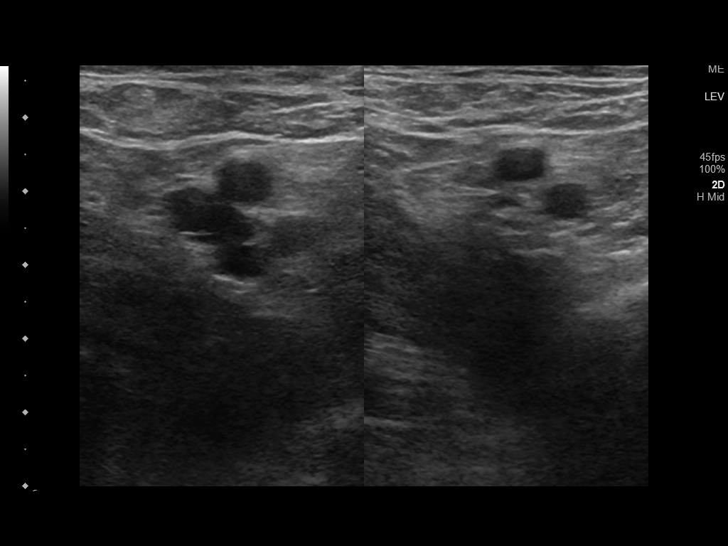
[im 15/35]
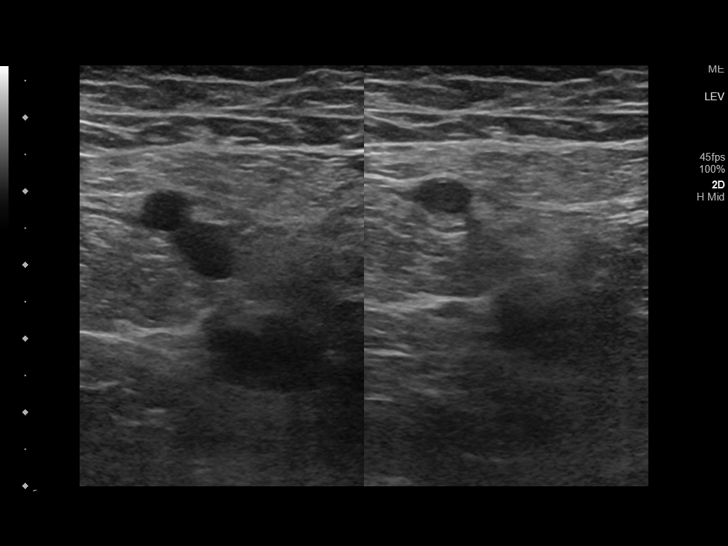
[im 18/35]
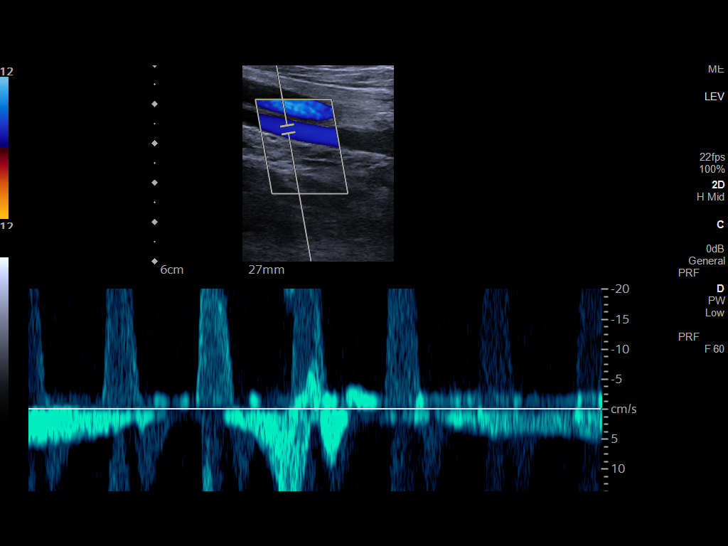
[im 20/35]
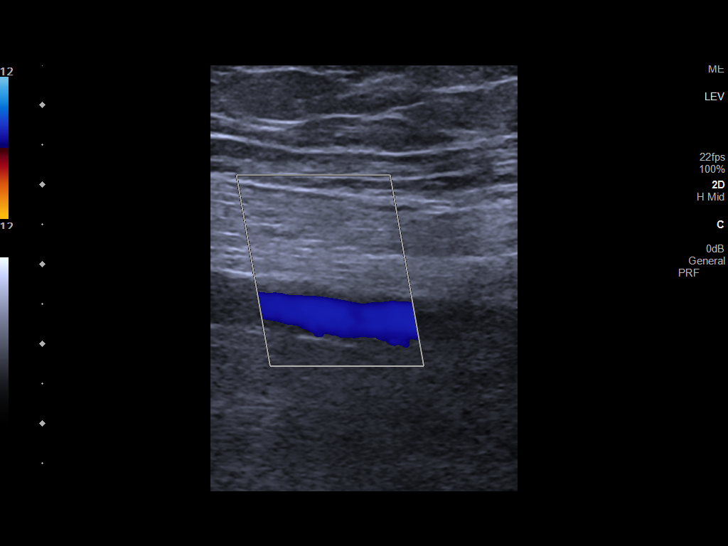
[im 23/35]
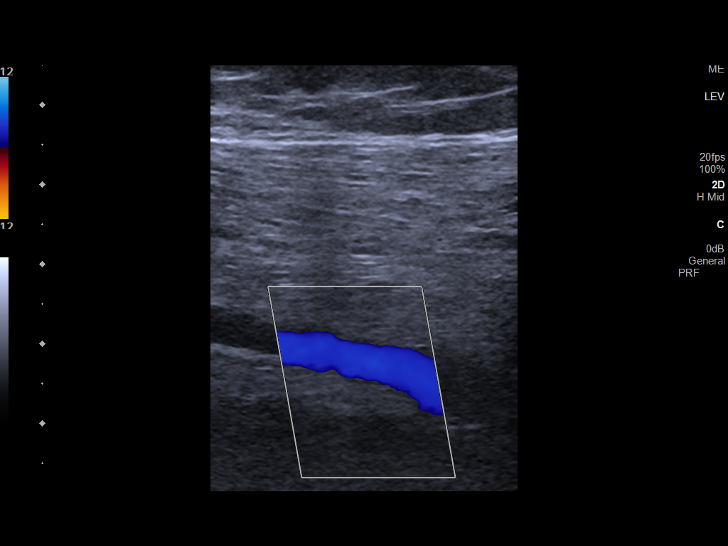
[im 26/35]
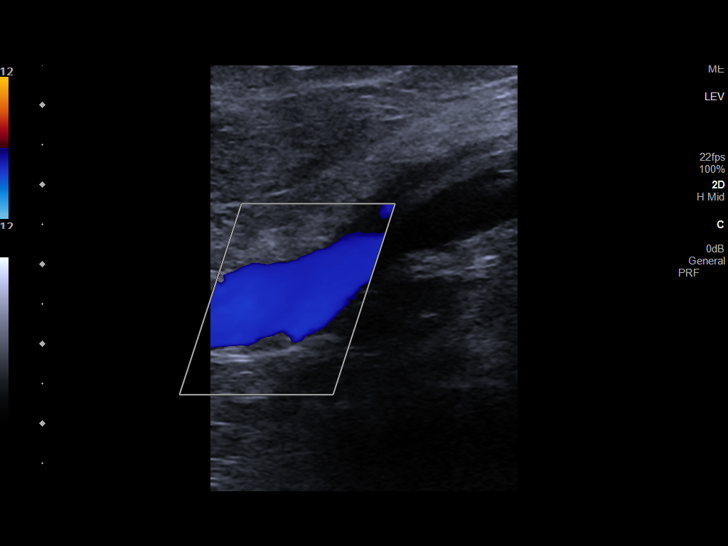
[im 29/35]
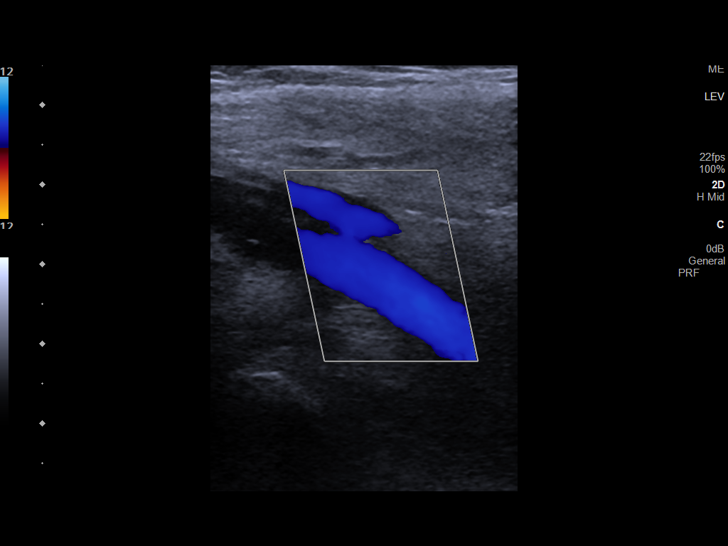
[im 32/35]
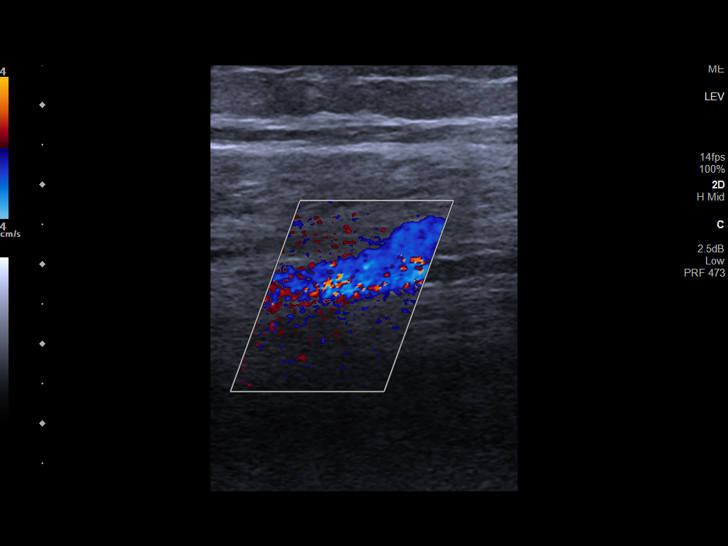
[im 35/35]
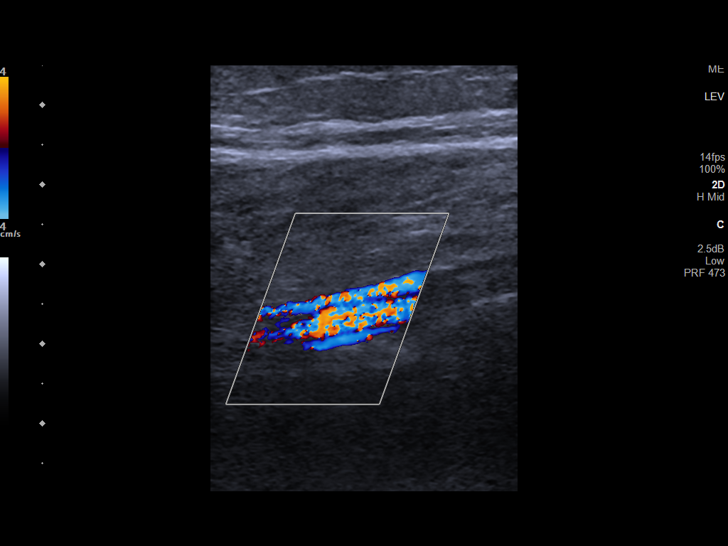

[13 of 24 positions shown; findings below may reference images not displayed]

FINDINGS: Contralateral Common Femoral Vein: Respiratory phasicity is normal
and symmetric with the symptomatic side. No evidence of thrombus.
Normal compressibility.

Common Femoral Vein: No evidence of thrombus. Normal
compressibility, respiratory phasicity and response to augmentation.

Saphenofemoral Junction: No evidence of thrombus. Normal
compressibility and flow on color Doppler imaging.

Profunda Femoral Vein: No evidence of thrombus. Normal
compressibility and flow on color Doppler imaging.

Femoral Vein: No evidence of thrombus. Normal compressibility,
respiratory phasicity and response to augmentation.

Popliteal Vein: No evidence of thrombus. Normal compressibility,
respiratory phasicity and response to augmentation.

Calf Veins: No evidence of thrombus. Normal compressibility and flow
on color Doppler imaging.

Superficial Great Saphenous Vein: No evidence of thrombus. Normal
compressibility and flow on color Doppler imaging.

Other Findings:  None.
IMPRESSION: Sonographic survey of the left lower extremity negative for DVT

## 2022-10-26 DIAGNOSIS — N189 Chronic kidney disease, unspecified: Secondary | ICD-10-CM | POA: Diagnosis not present

## 2022-10-26 DIAGNOSIS — G8929 Other chronic pain: Secondary | ICD-10-CM | POA: Diagnosis not present

## 2022-10-26 DIAGNOSIS — Z79891 Long term (current) use of opiate analgesic: Secondary | ICD-10-CM | POA: Diagnosis not present

## 2022-10-26 DIAGNOSIS — M5451 Vertebrogenic low back pain: Secondary | ICD-10-CM | POA: Diagnosis not present

## 2022-10-26 DIAGNOSIS — M5136 Other intervertebral disc degeneration, lumbar region: Secondary | ICD-10-CM | POA: Diagnosis not present

## 2022-10-26 DIAGNOSIS — I129 Hypertensive chronic kidney disease with stage 1 through stage 4 chronic kidney disease, or unspecified chronic kidney disease: Secondary | ICD-10-CM | POA: Diagnosis not present

## 2022-11-08 DIAGNOSIS — M5416 Radiculopathy, lumbar region: Secondary | ICD-10-CM | POA: Diagnosis not present

## 2022-11-08 DIAGNOSIS — M5136 Other intervertebral disc degeneration, lumbar region: Secondary | ICD-10-CM | POA: Diagnosis not present

## 2022-11-08 DIAGNOSIS — M48062 Spinal stenosis, lumbar region with neurogenic claudication: Secondary | ICD-10-CM | POA: Diagnosis not present

## 2022-11-11 ENCOUNTER — Inpatient Hospital Stay
Admission: RE | Admit: 2022-11-11 | Discharge: 2022-11-11 | Disposition: A | Discharge status: Home / Self Care | Payer: Self-pay | Source: Ambulatory Visit | Attending: Neurosurgery | Admitting: Neurosurgery

## 2022-11-11 ENCOUNTER — Other Ambulatory Visit: Payer: Self-pay

## 2022-11-11 DIAGNOSIS — Z049 Encounter for examination and observation for unspecified reason: Secondary | ICD-10-CM

## 2022-11-15 NOTE — Progress Notes (Unsigned)
Referring Physician:  Denton Lank, FNP [address] [place],  [place] [address]  Primary Physician:  Barbette Reichmann, MD  History of Present Illness: 11/17/2022 [name] is here today with a chief complaint of lower back and right leg pain.  This particularly occurs when she walks or stands.  Activities make it worse.  Sitting makes it better.  Her worst pain is on her right leg, which has been as bad as 7 out of 10.  She denies numbness.  Her pain is constant whenever she stands and walks.  This is been worsening over the past 8 months or so.  She has tried and failed physical therapy.  Bowel/Bladder Dysfunction: none  Conservative measures:  Physical therapy:  has participated in Home Health PT([facility]) for about 7 weeks Multimodal medical therapy including regular antiinflammatories:  hydrocodone, gabapentin, tylenol,  Injections:  has received epidural steroid injections 10/10/2022: Left trapezius ridge trigger point injection (mild relief) 08/18/2022: Right L5-S1 and right S1 transforaminal ESI (50% relief Celestone 9 mg) 07/08/2022: Right L4-5 and right L5-S1 transforaminal ESI (25% relief)   Past Surgery: no previous spine surgery  Teresa Johns has no symptoms of cervical myelopathy.  The symptoms are causing a significant impact on the patient's life.   I have utilized the care everywhere function in epic to review the outside records available from external health systems.  Review of Systems:  A 10 point review of systems is negative, except for the pertinent positives and negatives detailed in the HPI.  Past Medical History: Past Medical History:  Diagnosis Date   Colitis    History of chicken pox    Hypertension    Tinnitus     Past Surgical History: Past Surgical History:  Procedure Laterality Date   ESOPHAGOGASTRODUODENOSCOPY N/A 11/20/2020   Procedure: ESOPHAGOGASTRODUODENOSCOPY (EGD);  Surgeon: Jaynie Collins, DO;   Location: [facility] ENDOSCOPY;  Service: Gastroenterology;  Laterality: N/A;   GANGLION CYST EXCISION     PAROTID STONE REMOVAL     SHOULDER SURGERY Right    VAGINAL HYSTERECTOMY     partial    Allergies: Allergies as of 11/17/2022   (No Known Allergies)    Medications:  Current Outpatient Medications:    amLODipine (NORVASC) 10 MG tablet, Take 10 mg by mouth 1 day or 1 dose., Disp: , Rfl:    busPIRone (BUSPAR) 5 MG tablet, Take 5 mg by mouth 2 (two) times daily., Disp: , Rfl:    calcium carbonate (OSCAL) 1500 (600 Ca) MG TABS tablet, Take 1,500 mg by mouth 2 (two) times daily with a meal., Disp: , Rfl:    cefUROXime (CEFTIN) 250 MG tablet, Take 250 mg by mouth 2 (two) times daily., Disp: , Rfl:    Cholecalciferol 25 MCG (1000 UT) tablet, Take by mouth., Disp: , Rfl:    Cyanocobalamin (VITAMIN B 12) 500 MCG TABS, Take 1,000 mg by mouth daily., Disp: , Rfl:    gabapentin (NEURONTIN) 300 MG capsule, 1 po qHS, Disp: , Rfl:    gentamicin cream (GARAMYCIN) 0.1 %, Apply 1 application topically 2 (two) times daily., Disp: 30 g, Rfl: 1   hydrochlorothiazide (HYDRODIURIL) 12.5 MG tablet, Take 12.5 mg by mouth daily., Disp: , Rfl:    Ibuprofen 200 MG CAPS, Take 600 mg by mouth once., Disp: , Rfl:    losartan (COZAAR) 50 MG tablet, Take 50 mg by mouth daily., Disp: , Rfl:    Multiple Vitamin (MULTI-VITAMIN) tablet, Take 1 tablet  by mouth daily., Disp: , Rfl:    ondansetron (ZOFRAN) 4 MG tablet, Take 4 mg by mouth every 8 (eight) hours as needed for nausea or vomiting., Disp: , Rfl:    Vitamins-Lipotropics (BALANCED B-50) TABS, Take 1 tablet by mouth daily., Disp: , Rfl:    fluticasone (FLONASE) 50 MCG/ACT nasal spray, Place into the nose., Disp: , Rfl:    iron polysaccharides (NIFEREX) 150 MG capsule, Take by mouth., Disp: , Rfl:    pantoprazole (PROTONIX) 20 MG tablet, Take 1 tablet (20 mg total) by mouth daily., Disp: 30 tablet, Rfl: 1   triamterene-hydrochlorothiazide (DYAZIDE) 37.5-25 MG  capsule, Take 1 capsule by mouth every morning., Disp: , Rfl:   Social History: Social History   Tobacco Use   Smoking status: Never   Smokeless tobacco: Never  Vaping Use   Vaping status: Never Used  Substance Use Topics   Alcohol use: Never   Drug use: Never    Family Medical History: Family History  Problem Relation Age of Onset   Breast cancer Maternal Aunt 60    Physical Examination: Vitals:   11/17/22 1005  BP: 118/72    General: Patient is in no apparent distress. Attention to examination is appropriate.  Neck:   Supple.  Full range of motion.  Respiratory: Patient is breathing without any difficulty.   NEUROLOGICAL:     Awake, alert, oriented to person, place, and time.  Speech is clear and fluent.   Cranial Nerves: Pupils equal round and reactive to light.  Facial tone is symmetric.  Facial sensation is symmetric. Shoulder shrug is symmetric. Tongue protrusion is midline.  There is no pronator drift.  Strength: Side Biceps Triceps Deltoid Interossei Grip Wrist Ext. Wrist Flex.  R 5 5 5 5 5 5 5   L 5 5 5 5 5 5 5    Side Iliopsoas Quads Hamstring PF DF EHL  R 5 5 5 5 5 5   L 5 5 5 5 5 5    Reflexes are 1+ and symmetric at the biceps, triceps, brachioradialis, patella and achilles.   Hoffman's is absent.   Bilateral upper and lower extremity sensation is intact to light touch.    No evidence of dysmetria noted.  Gait is stooped and slow.     Medical Decision Making  Imaging: CT L spine 08/16/2022 IMPRESSION: 1. 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 Please correlate with imaging if any intervention is planned. 2. L4-L5 severe spinal canal stenosis and mild right neural foraminal narrowing. 3. L3-L4 moderate spinal canal stenosis and mild left neural foraminal narrowing. 4. L2-L3 mild spinal canal stenosis and mild left neural foraminal narrowing. 5.  Effacement of the lateral recesses at L3-L4 and L4-L5, which likely compresses the descending L4 and L5 nerve roots, respectively. Narrowing of the lateral recesses at L5-S1 could also affect the descending S1 nerve roots. 6. Aortic atherosclerosis.   Aortic Atherosclerosis (ICD10-I70.0).     Electronically Signed   By: Wiliam Ke M.D.   On: 08/16/2022 14:52  I have personally reviewed the images and agree with the above interpretation.  Assessment and Plan: Ms. Pellicer is a pleasant 88 y.o. female with symptoms of neurogenic claudication.  This is incompletely evaluated on her CT scan.  She has tried and failed conservative management.  I would like to more fully evaluate her anatomy with an MRI scan.  I will give her a low-dose Valium to  take prior if needed.  I will see her back after her imaging.    Thank you for involving me in the care of this patient.      Kalvin Buss K. Myer Haff MD, [facility] Neurosurgery

## 2022-11-17 ENCOUNTER — Encounter: Payer: Self-pay | Admitting: Neurosurgery

## 2022-11-17 ENCOUNTER — Ambulatory Visit: Payer: No Typology Code available for payment source | Admitting: Neurosurgery

## 2022-11-17 VITALS — BP 118/72 | Ht 63.0 in | Wt 153.2 lb

## 2022-11-17 DIAGNOSIS — M48062 Spinal stenosis, lumbar region with neurogenic claudication: Secondary | ICD-10-CM

## 2022-11-17 MED ORDER — DIAZEPAM 2 MG PO TABS: 2.0000 mg | ORAL_TABLET | Freq: Once | ORAL | 0 refills | Status: DC | PRN

## 2022-12-08 ENCOUNTER — Ambulatory Visit
Admission: RE | Admit: 2022-12-08 | Discharge: 2022-12-08 | Disposition: A | Discharge status: Home / Self Care | Payer: No Typology Code available for payment source | Source: Ambulatory Visit | Attending: Neurosurgery | Admitting: Neurosurgery

## 2022-12-08 DIAGNOSIS — M4807 Spinal stenosis, lumbosacral region: Secondary | ICD-10-CM | POA: Diagnosis not present

## 2022-12-08 DIAGNOSIS — M48062 Spinal stenosis, lumbar region with neurogenic claudication: Secondary | ICD-10-CM | POA: Diagnosis not present

## 2022-12-08 DIAGNOSIS — M4726 Other spondylosis with radiculopathy, lumbar region: Secondary | ICD-10-CM | POA: Diagnosis not present

## 2022-12-08 DIAGNOSIS — M5116 Intervertebral disc disorders with radiculopathy, lumbar region: Secondary | ICD-10-CM | POA: Diagnosis not present

## 2022-12-23 ENCOUNTER — Telehealth: Payer: Self-pay | Admitting: Neurosurgery

## 2022-12-23 NOTE — Telephone Encounter (Signed)
Patient is calling to let our office know that since she has had her MRI, she has began having some Neuralgia in the left side of her face from her forehead down around her eye. She states that her PCP, Dr. Marcello Fennel prescribed her to take Gabapentin 300mg  twice daily. She states this is helping with the Neuralgia, but wanted to let our office know this was going on.

## 2022-12-23 NOTE — Telephone Encounter (Signed)
Called the patient and advised This would not be related her lower back. Recommend she continue to see her PCP for it.  Patient understood

## 2022-12-23 NOTE — Telephone Encounter (Signed)
Okay. This would not be related her lower back. Recommend she continue to see her PCP for it.

## 2023-01-03 ENCOUNTER — Other Ambulatory Visit: Payer: Self-pay

## 2023-01-03 ENCOUNTER — Ambulatory Visit (INDEPENDENT_AMBULATORY_CARE_PROVIDER_SITE_OTHER): Payer: No Typology Code available for payment source | Admitting: Neurosurgery

## 2023-01-03 ENCOUNTER — Encounter: Payer: Self-pay | Admitting: Neurosurgery

## 2023-01-03 VITALS — BP 152/88 | Ht 63.0 in | Wt 153.0 lb

## 2023-01-03 DIAGNOSIS — M48062 Spinal stenosis, lumbar region with neurogenic claudication: Secondary | ICD-10-CM

## 2023-01-03 DIAGNOSIS — K668 Other specified disorders of peritoneum: Secondary | ICD-10-CM

## 2023-01-03 DIAGNOSIS — N281 Cyst of kidney, acquired: Secondary | ICD-10-CM | POA: Diagnosis not present

## 2023-01-03 DIAGNOSIS — N2889 Other specified disorders of kidney and ureter: Secondary | ICD-10-CM

## 2023-01-03 DIAGNOSIS — Z01818 Encounter for other preprocedural examination: Secondary | ICD-10-CM

## 2023-01-03 NOTE — Progress Notes (Signed)
Referring Physician:  No referring provider defined for this encounter.  Primary Physician:  Barbette Reichmann, MD  History of Present Illness: 01/03/2023 Teresa Johns returns to see me.  She is here to discuss her imaging findings.  11/17/2022 Teresa Johns is here today with a chief complaint of lower back and right leg pain.  This particularly occurs when she walks or stands.  Activities make it worse.  Sitting makes it better.  Her worst pain is on her right leg, which has been as bad as 7 out of 10.  She denies numbness.  Her pain is constant whenever she stands and walks.  This is been worsening over the past 8 months or so.  She has tried and failed physical therapy.  Bowel/Bladder Dysfunction: none  Conservative measures:  Physical therapy:  has participated in Home Health PT([facility]) for about 7 weeks Multimodal medical therapy including regular antiinflammatories:  hydrocodone, gabapentin, tylenol,  Injections:  has received epidural steroid injections 10/10/2022: Left trapezius ridge trigger point injection (mild relief) 08/18/2022: Right L5-S1 and right S1 transforaminal ESI (50% relief Celestone 9 mg) 07/08/2022: Right L4-5 and right L5-S1 transforaminal ESI (25% relief)   Past Surgery: no previous spine surgery  Teresa Johns has no symptoms of cervical myelopathy.  The symptoms are causing a significant impact on the patient's life.   I have utilized the care everywhere function in epic to review the outside records available from external health systems.  Review of Systems:  A 10 point review of systems is negative, except for the pertinent positives and negatives detailed in the HPI.  Past Medical History: Past Medical History:  Diagnosis Date   Colitis    History of chicken pox    Hypertension    Tinnitus     Past Surgical History: Past Surgical History:  Procedure Laterality Date   ESOPHAGOGASTRODUODENOSCOPY N/A 11/20/2020   Procedure:  ESOPHAGOGASTRODUODENOSCOPY (EGD);  Surgeon: Jaynie Collins, DO;  Location: [facility] ENDOSCOPY;  Service: Gastroenterology;  Laterality: N/A;   GANGLION CYST EXCISION     PAROTID STONE REMOVAL     SHOULDER SURGERY Right    VAGINAL HYSTERECTOMY     partial    Allergies: Allergies as of 01/03/2023   (No Known Allergies)    Medications:  Current Outpatient Medications:    amLODipine (NORVASC) 10 MG tablet, Take 10 mg by mouth 1 day or 1 dose., Disp: , Rfl:    busPIRone (BUSPAR) 5 MG tablet, Take 5 mg by mouth 2 (two) times daily., Disp: , Rfl:    calcium carbonate (OSCAL) 1500 (600 Ca) MG TABS tablet, Take 1,500 mg by mouth 2 (two) times daily with a meal., Disp: , Rfl:    cefUROXime (CEFTIN) 250 MG tablet, Take 250 mg by mouth 2 (two) times daily., Disp: , Rfl:    Cholecalciferol 25 MCG (1000 UT) tablet, Take by mouth., Disp: , Rfl:    Cyanocobalamin (VITAMIN B 12) 500 MCG TABS, Take 1,000 mg by mouth daily., Disp: , Rfl:    fluticasone (FLONASE) 50 MCG/ACT nasal spray, Place into the nose., Disp: , Rfl:    gabapentin (NEURONTIN) 300 MG capsule, Take 300 mg by mouth 2 (two) times daily., Disp: , Rfl:    gentamicin cream (GARAMYCIN) 0.1 %, Apply 1 application topically 2 (two) times daily., Disp: 30 g, Rfl: 1   hydrochlorothiazide (HYDRODIURIL) 12.5 MG tablet, Take 12.5 mg by mouth daily., Disp: , Rfl:    Ibuprofen 200 MG CAPS, Take 600  mg by mouth once., Disp: , Rfl:    losartan (COZAAR) 50 MG tablet, Take 50 mg by mouth daily., Disp: , Rfl:    Multiple Vitamin (MULTI-VITAMIN) tablet, Take 1 tablet by mouth daily., Disp: , Rfl:    ondansetron (ZOFRAN) 4 MG tablet, Take 4 mg by mouth every 8 (eight) hours as needed for nausea or vomiting., Disp: , Rfl:    Vitamins-Lipotropics (BALANCED B-50) TABS, Take 1 tablet by mouth daily., Disp: , Rfl:    iron polysaccharides (NIFEREX) 150 MG capsule, Take by mouth., Disp: , Rfl:    pantoprazole (PROTONIX) 20 MG tablet, Take 1 tablet (20 mg total)  by mouth daily., Disp: 30 tablet, Rfl: 1   triamterene-hydrochlorothiazide (DYAZIDE) 37.5-25 MG capsule, Take 1 capsule by mouth every morning., Disp: , Rfl:   Social History: Social History   Tobacco Use   Smoking status: Never   Smokeless tobacco: Never  Vaping Use   Vaping status: Never Used  Substance Use Topics   Alcohol use: Never   Drug use: Never    Family Medical History: Family History  Problem Relation Age of Onset   Breast cancer Maternal Aunt 60    Physical Examination: Vitals:   01/03/23 0940  BP: (!) 152/88    General: Patient is in no apparent distress. Attention to examination is appropriate.  Neck:   Supple.  Full range of motion.  Respiratory: Patient is breathing without any difficulty.   NEUROLOGICAL:     Awake, alert, oriented to person, place, and time.  Speech is clear and fluent.   Cranial Nerves: Pupils equal round and reactive to light.  Facial tone is symmetric.  Facial sensation is symmetric. Shoulder shrug is symmetric. Tongue protrusion is midline.  There is no pronator drift.  Strength: Side Biceps Triceps Deltoid Interossei Grip Wrist Ext. Wrist Flex.  R 5 5 5 5 5 5 5   L 5 5 5 5 5 5 5    Side Iliopsoas Quads Hamstring PF DF EHL  R 5 5 5 5 5 5   L 5 5 5 5 5 5    Reflexes are 1+ and symmetric at the biceps, triceps, brachioradialis, patella and achilles.   Hoffman's is absent.   Bilateral upper and lower extremity sensation is intact to light touch.    No evidence of dysmetria noted.  Gait is stooped and slow.     Medical Decision Making  Imaging: CT L spine 08/16/2022 IMPRESSION: 1. 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 Please correlate with imaging if any intervention is planned. 2. L4-L5 severe spinal canal stenosis and mild right neural foraminal narrowing. 3. L3-L4 moderate spinal canal stenosis and mild left neural foraminal  narrowing. 4. L2-L3 mild spinal canal stenosis and mild left neural foraminal narrowing. 5. Effacement of the lateral recesses at L3-L4 and L4-L5, which likely compresses the descending L4 and L5 nerve roots, respectively. Narrowing of the lateral recesses at L5-S1 could also affect the descending S1 nerve roots. 6. Aortic atherosclerosis.   Aortic Atherosclerosis (ICD10-I70.0).     Electronically Signed   By: Wiliam Ke M.D.   On: 08/16/2022 14:52  MRI L spine 12/08/2022 IMPRESSION: 1. Unchanged advanced lumbar disc and facet degeneration with severe spinal stenosis at L3-4 and L4-5 and moderate stenosis at L2-3 and L5-S1. 2. Moderate left neural foraminal stenosis at L3-4. 3. 1.3 cm indeterminate left renal lesion. Pre and postcontrast abdominal MRI or CT  is recommended for further evaluation. 4. Suspected 9 cm cyst or other mass in the midline of the upper abdomen. This can also be evaluated with the above recommended MRI or CT. 5. 2.5 cm benign left adrenal adenoma.     Electronically Signed   By: Sebastian Ache M.D.   On: 12/29/2022 11:00 I have personally reviewed the images and agree with the above interpretation.  Assessment and Plan: Ms. Schwake is a pleasant 88 y.o. female with symptoms of neurogenic claudication.  She has evidence of severe stenosis at L3-4 and L4-5 and moderate stenosis at L2-3 and L5/S1.  She has tried and failed conservative management.  I recommended L2-S1 decompression.    I discussed the planned procedure at length with the patient, including the risks, benefits, alternatives, and indications. The risks discussed include but are not limited to bleeding, infection, need for reoperation, spinal fluid leak, stroke, vision loss, anesthetic complication, coma, paralysis, and even death. I also described in detail that improvement was not guaranteed.  The patient expressed understanding of these risks, and asked that we proceed with surgery. I  described the surgery in layman's terms, and gave ample opportunity for questions, which were answered to the best of my ability.   She was found to have 2 different intra-abdominal cystic lesions on her lumbar spine MRI scan.  I will order an abdominal MRI scan per request of the reading radiologist.      Thank you for involving me in the care of this patient.      Leatha Rohner K. Myer Haff MD, [facility] Neurosurgery

## 2023-01-03 NOTE — H&P (View-Only) (Signed)
Referring Physician:  No referring provider defined for this encounter.  Primary Physician:  Barbette Reichmann, MD  History of Present Illness: 01/03/2023 Teresa Johns returns to see me.  She is here to discuss her imaging findings.  11/17/2022 Teresa Johns is here today with a chief complaint of lower back and right leg pain.  This particularly occurs when she walks or stands.  Activities make it worse.  Sitting makes it better.  Her worst pain is on her right leg, which has been as bad as 7 out of 10.  She denies numbness.  Her pain is constant whenever she stands and walks.  This is been worsening over the past 8 months or so.  She has tried and failed physical therapy.  Bowel/Bladder Dysfunction: none  Conservative measures:  Physical therapy:  has participated in Home Health PT([facility]) for about 7 weeks Multimodal medical therapy including regular antiinflammatories:  hydrocodone, gabapentin, tylenol,  Injections:  has received epidural steroid injections 10/10/2022: Left trapezius ridge trigger point injection (mild relief) 08/18/2022: Right L5-S1 and right S1 transforaminal ESI (50% relief Celestone 9 mg) 07/08/2022: Right L4-5 and right L5-S1 transforaminal ESI (25% relief)   Past Surgery: no previous spine surgery  Teresa Johns has no symptoms of cervical myelopathy.  The symptoms are causing a significant impact on the patient's life.   I have utilized the care everywhere function in epic to review the outside records available from external health systems.  Review of Systems:  A 10 point review of systems is negative, except for the pertinent positives and negatives detailed in the HPI.  Past Medical History: Past Medical History:  Diagnosis Date   Colitis    History of chicken pox    Hypertension    Tinnitus     Past Surgical History: Past Surgical History:  Procedure Laterality Date   ESOPHAGOGASTRODUODENOSCOPY N/A 11/20/2020   Procedure:  ESOPHAGOGASTRODUODENOSCOPY (EGD);  Surgeon: Jaynie Collins, DO;  Location: [facility] ENDOSCOPY;  Service: Gastroenterology;  Laterality: N/A;   GANGLION CYST EXCISION     PAROTID STONE REMOVAL     SHOULDER SURGERY Right    VAGINAL HYSTERECTOMY     partial    Allergies: Allergies as of 01/03/2023   (No Known Allergies)    Medications:  Current Outpatient Medications:    amLODipine (NORVASC) 10 MG tablet, Take 10 mg by mouth 1 day or 1 dose., Disp: , Rfl:    busPIRone (BUSPAR) 5 MG tablet, Take 5 mg by mouth 2 (two) times daily., Disp: , Rfl:    calcium carbonate (OSCAL) 1500 (600 Ca) MG TABS tablet, Take 1,500 mg by mouth 2 (two) times daily with a meal., Disp: , Rfl:    cefUROXime (CEFTIN) 250 MG tablet, Take 250 mg by mouth 2 (two) times daily., Disp: , Rfl:    Cholecalciferol 25 MCG (1000 UT) tablet, Take by mouth., Disp: , Rfl:    Cyanocobalamin (VITAMIN B 12) 500 MCG TABS, Take 1,000 mg by mouth daily., Disp: , Rfl:    fluticasone (FLONASE) 50 MCG/ACT nasal spray, Place into the nose., Disp: , Rfl:    gabapentin (NEURONTIN) 300 MG capsule, Take 300 mg by mouth 2 (two) times daily., Disp: , Rfl:    gentamicin cream (GARAMYCIN) 0.1 %, Apply 1 application topically 2 (two) times daily., Disp: 30 g, Rfl: 1   hydrochlorothiazide (HYDRODIURIL) 12.5 MG tablet, Take 12.5 mg by mouth daily., Disp: , Rfl:    Ibuprofen 200 MG CAPS, Take 600  mg by mouth once., Disp: , Rfl:    losartan (COZAAR) 50 MG tablet, Take 50 mg by mouth daily., Disp: , Rfl:    Multiple Vitamin (MULTI-VITAMIN) tablet, Take 1 tablet by mouth daily., Disp: , Rfl:    ondansetron (ZOFRAN) 4 MG tablet, Take 4 mg by mouth every 8 (eight) hours as needed for nausea or vomiting., Disp: , Rfl:    Vitamins-Lipotropics (BALANCED B-50) TABS, Take 1 tablet by mouth daily., Disp: , Rfl:    iron polysaccharides (NIFEREX) 150 MG capsule, Take by mouth., Disp: , Rfl:    pantoprazole (PROTONIX) 20 MG tablet, Take 1 tablet (20 mg total)  by mouth daily., Disp: 30 tablet, Rfl: 1   triamterene-hydrochlorothiazide (DYAZIDE) 37.5-25 MG capsule, Take 1 capsule by mouth every morning., Disp: , Rfl:   Social History: Social History   Tobacco Use   Smoking status: Never   Smokeless tobacco: Never  Vaping Use   Vaping status: Never Used  Substance Use Topics   Alcohol use: Never   Drug use: Never    Family Medical History: Family History  Problem Relation Age of Onset   Breast cancer Maternal Aunt 60    Physical Examination: Vitals:   01/03/23 0940  BP: (!) 152/88    General: Patient is in no apparent distress. Attention to examination is appropriate.  Neck:   Supple.  Full range of motion.  Respiratory: Patient is breathing without any difficulty.   NEUROLOGICAL:     Awake, alert, oriented to person, place, and time.  Speech is clear and fluent.   Cranial Nerves: Pupils equal round and reactive to light.  Facial tone is symmetric.  Facial sensation is symmetric. Shoulder shrug is symmetric. Tongue protrusion is midline.  There is no pronator drift.  Strength: Side Biceps Triceps Deltoid Interossei Grip Wrist Ext. Wrist Flex.  R 5 5 5 5 5 5 5   L 5 5 5 5 5 5 5    Side Iliopsoas Quads Hamstring PF DF EHL  R 5 5 5 5 5 5   L 5 5 5 5 5 5    Reflexes are 1+ and symmetric at the biceps, triceps, brachioradialis, patella and achilles.   Hoffman's is absent.   Bilateral upper and lower extremity sensation is intact to light touch.    No evidence of dysmetria noted.  Gait is stooped and slow.     Medical Decision Making  Imaging: CT L spine 08/16/2022 IMPRESSION: 1. 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 Please correlate with imaging if any intervention is planned. 2. L4-L5 severe spinal canal stenosis and mild right neural foraminal narrowing. 3. L3-L4 moderate spinal canal stenosis and mild left neural foraminal  narrowing. 4. L2-L3 mild spinal canal stenosis and mild left neural foraminal narrowing. 5. Effacement of the lateral recesses at L3-L4 and L4-L5, which likely compresses the descending L4 and L5 nerve roots, respectively. Narrowing of the lateral recesses at L5-S1 could also affect the descending S1 nerve roots. 6. Aortic atherosclerosis.   Aortic Atherosclerosis (ICD10-I70.0).     Electronically Signed   By: Wiliam Ke M.D.   On: 08/16/2022 14:52  MRI L spine 12/08/2022 IMPRESSION: 1. Unchanged advanced lumbar disc and facet degeneration with severe spinal stenosis at L3-4 and L4-5 and moderate stenosis at L2-3 and L5-S1. 2. Moderate left neural foraminal stenosis at L3-4. 3. 1.3 cm indeterminate left renal lesion. Pre and postcontrast abdominal MRI or CT  is recommended for further evaluation. 4. Suspected 9 cm cyst or other mass in the midline of the upper abdomen. This can also be evaluated with the above recommended MRI or CT. 5. 2.5 cm benign left adrenal adenoma.     Electronically Signed   By: Sebastian Ache M.D.   On: 12/29/2022 11:00 I have personally reviewed the images and agree with the above interpretation.  Assessment and Plan: Teresa Johns is a pleasant 88 y.o. female with symptoms of neurogenic claudication.  She has evidence of severe stenosis at L3-4 and L4-5 and moderate stenosis at L2-3 and L5/S1.  She has tried and failed conservative management.  I recommended L2-S1 decompression.    I discussed the planned procedure at length with the patient, including the risks, benefits, alternatives, and indications. The risks discussed include but are not limited to bleeding, infection, need for reoperation, spinal fluid leak, stroke, vision loss, anesthetic complication, coma, paralysis, and even death. I also described in detail that improvement was not guaranteed.  The patient expressed understanding of these risks, and asked that we proceed with surgery. I  described the surgery in layman's terms, and gave ample opportunity for questions, which were answered to the best of my ability.   She was found to have 2 different intra-abdominal cystic lesions on her lumbar spine MRI scan.  I will order an abdominal MRI scan per request of the reading radiologist.      Thank you for involving me in the care of this patient.      Teresa Johns K. Myer Haff MD, [facility] Neurosurgery

## 2023-01-03 NOTE — Patient Instructions (Signed)
Please see below for information in regards to your upcoming surgery:   Planned surgery: L2-S1 decompression   Surgery date: 01/25/23 at [facility]: [address], [place], [place] [address]) - you will find out your arrival time the business day before your surgery.   Pre-op appointment at [facility] Pre-admit Testing: we will call you with a date/time for this. If you are scheduled for an in person appointment, Pre-admit Testing is located on the first floor of the [facility], [address], [address]. Please bring all prescriptions in the original prescription bottles to your appointment. During this appointment, they will advise you which medications you can take the morning of surgery, and which medications you will need to hold for surgery. Labs (such as blood work, EKG) may be done at your pre-op appointment. You are not required to fast for these labs. Should you need to change your pre-op appointment, please call Pre-admit testing at [phone].     Surgical clearance: we will send a clearance form to Dr Marcello Fennel. They may wish to see you in their office prior to signing the clearance form. If so, they may call you to schedule an appointment.    Common restrictions after surgery: No bending, lifting, or twisting ("BLT"). Avoid lifting objects heavier than 10 pounds for the first 6 weeks after surgery. Where possible, avoid household activities that involve lifting, bending, reaching, pushing, or pulling such as laundry, vacuuming, grocery shopping, and childcare. Try to arrange for help from friends and family for these activities while you heal. Do not drive while taking prescription pain medication. Weeks 6 through 12 after surgery: avoid lifting more than 25 pounds.    How to contact us:  If you have any questions/concerns before or after surgery, you can reach [place] at [phone], or you can send a mychart message. We  can be reached by phone or mychart 8am-4pm, Monday-Friday.  *Please note: Calls after 4pm are forwarded to a third party answering service. Mychart messages are not routinely monitored during evenings, weekends, and holidays. Please call our office to contact the answering service for urgent concerns during non-business hours.    Appointments/FMLA & disability paperwork: Joycelyn Rua, & Flonnie Hailstone Registered Nurse/Surgery scheduler: Royston Cowper Medical Assistants: Nash Mantis Physician Assistants: Manning Charity, PA-C & Drake Leach, PA-C Surgeons: Venetia Night, MD & Ernestine Mcmurray, MD

## 2023-01-03 NOTE — Addendum Note (Signed)
Addended by: Sharlot Gowda on: 01/03/2023 10:39 AM   Modules accepted: Orders

## 2023-01-09 DIAGNOSIS — M48061 Spinal stenosis, lumbar region without neurogenic claudication: Secondary | ICD-10-CM | POA: Diagnosis not present

## 2023-01-09 DIAGNOSIS — I10 Essential (primary) hypertension: Secondary | ICD-10-CM | POA: Diagnosis not present

## 2023-01-09 DIAGNOSIS — Z01818 Encounter for other preprocedural examination: Secondary | ICD-10-CM | POA: Diagnosis not present

## 2023-01-09 DIAGNOSIS — R262 Difficulty in walking, not elsewhere classified: Secondary | ICD-10-CM | POA: Diagnosis not present

## 2023-01-09 DIAGNOSIS — N289 Disorder of kidney and ureter, unspecified: Secondary | ICD-10-CM | POA: Diagnosis not present

## 2023-01-09 DIAGNOSIS — R519 Headache, unspecified: Secondary | ICD-10-CM | POA: Diagnosis not present

## 2023-01-10 ENCOUNTER — Ambulatory Visit
Admission: RE | Admit: 2023-01-10 | Discharge: 2023-01-10 | Disposition: A | Discharge status: Home / Self Care | Payer: No Typology Code available for payment source | Source: Ambulatory Visit | Attending: Neurosurgery | Admitting: Neurosurgery

## 2023-01-10 DIAGNOSIS — K7689 Other specified diseases of liver: Secondary | ICD-10-CM | POA: Diagnosis not present

## 2023-01-10 DIAGNOSIS — K8020 Calculus of gallbladder without cholecystitis without obstruction: Secondary | ICD-10-CM | POA: Diagnosis not present

## 2023-01-10 DIAGNOSIS — N2889 Other specified disorders of kidney and ureter: Secondary | ICD-10-CM | POA: Insufficient documentation

## 2023-01-10 DIAGNOSIS — D3502 Benign neoplasm of left adrenal gland: Secondary | ICD-10-CM | POA: Diagnosis not present

## 2023-01-10 DIAGNOSIS — N281 Cyst of kidney, acquired: Secondary | ICD-10-CM | POA: Diagnosis not present

## 2023-01-10 MED ORDER — GADOBUTROL 1 MMOL/ML IV SOLN
7.0000 mL | Freq: Once | INTRAVENOUS | Status: AC | PRN
  Administered 2023-01-10: 7 mL via INTRAVENOUS

## 2023-01-12 ENCOUNTER — Encounter
Admission: RE | Admit: 2023-01-12 | Discharge: 2023-01-12 | Disposition: A | Discharge status: Home / Self Care | Payer: No Typology Code available for payment source | Source: Ambulatory Visit | Attending: Neurosurgery | Admitting: Neurosurgery

## 2023-01-12 VITALS — BP 125/46 | HR 82 | Resp 12 | Ht 63.0 in | Wt 154.8 lb

## 2023-01-12 DIAGNOSIS — Z01810 Encounter for preprocedural cardiovascular examination: Secondary | ICD-10-CM

## 2023-01-12 DIAGNOSIS — Z01812 Encounter for preprocedural laboratory examination: Secondary | ICD-10-CM | POA: Insufficient documentation

## 2023-01-12 DIAGNOSIS — Z01818 Encounter for other preprocedural examination: Secondary | ICD-10-CM | POA: Diagnosis present

## 2023-01-12 DIAGNOSIS — I10 Essential (primary) hypertension: Secondary | ICD-10-CM | POA: Insufficient documentation

## 2023-01-12 HISTORY — DX: Anemia, unspecified: D64.9

## 2023-01-12 HISTORY — DX: Polyneuropathy, unspecified: G62.9

## 2023-01-12 HISTORY — DX: Benign neoplasm of left adrenal gland: D35.02

## 2023-01-12 HISTORY — DX: Personal history of other infectious and parasitic diseases: Z86.19

## 2023-01-12 HISTORY — DX: Spinal stenosis, lumbar region without neurogenic claudication: M48.061

## 2023-01-12 HISTORY — DX: Personal history of other diseases of the digestive system: Z87.19

## 2023-01-12 HISTORY — DX: Gastro-esophageal reflux disease without esophagitis: K21.9

## 2023-01-12 HISTORY — DX: Other complications of anesthesia, initial encounter: T88.59XA

## 2023-01-12 LAB — TYPE AND SCREEN
ABO/RH(D): A POS
Antibody Screen: NEGATIVE

## 2023-01-12 LAB — BASIC METABOLIC PANEL
Anion gap: 9 (ref 5–15)
BUN: 31 mg/dL — ABNORMAL HIGH (ref 8–23)
CO2: 23 mmol/L (ref 22–32)
Calcium: 9.4 mg/dL (ref 8.9–10.3)
Chloride: 108 mmol/L (ref 98–111)
Creatinine, Ser: 1.27 mg/dL — ABNORMAL HIGH (ref 0.44–1.00)
GFR, Estimated: 41 mL/min — ABNORMAL LOW (ref >60)
Glucose, Bld: 102 mg/dL — ABNORMAL HIGH (ref 70–99)
Potassium: 3.8 mmol/L (ref 3.5–5.1)
Sodium: 140 mmol/L (ref 135–145)

## 2023-01-12 LAB — CBC
HCT: 37.4 % (ref 36.0–46.0)
Hemoglobin: 12.6 g/dL (ref 12.0–15.0)
MCH: 31.5 pg (ref 26.0–34.0)
MCHC: 33.7 g/dL (ref 30.0–36.0)
MCV: 93.5 fL (ref 80.0–100.0)
Platelets: 257 10*3/uL (ref 150–400)
RBC: 4 MIL/uL (ref 3.87–5.11)
RDW: 13.3 % (ref 11.5–15.5)
WBC: 6.9 10*3/uL (ref 4.0–10.5)
nRBC: 0 % (ref 0.0–0.2)

## 2023-01-12 LAB — SURGICAL PCR SCREEN
MRSA, PCR: NEGATIVE
Staphylococcus aureus: NEGATIVE

## 2023-01-12 NOTE — Patient Instructions (Addendum)
Your procedure is scheduled on:01-25-23 Wednesday Report to the Registration Desk on the 1st floor of the [facility].Then proceed to the 2nd floor Surgery Desk To find out your arrival time, please call [phone] between 1PM - 3PM on:01-24-23 Tuesday If your arrival time is 6:00 am, do not arrive before that time as the [facility] entrance doors do not open until 6:00 am.  REMEMBER: Instructions that are not followed completely may result in serious medical risk, up to and including death; or upon the discretion of your surgeon and anesthesiologist your surgery may need to be rescheduled.  Do not eat food after midnight the night before surgery.  No gum chewing or hard candies.  You may however, drink CLEAR liquids up to 2 hours before you are scheduled to arrive for your surgery. Do not drink anything within 2 hours of your scheduled arrival time.  Clear liquids include: - water  - apple juice without pulp - gatorade (not RED colors) - black coffee or tea (Do NOT add milk or creamers to the coffee or tea) Do NOT drink anything that is not on this list.  One week prior to surgery:Last dose will be on 01-17-23  Stop ANY OVER THE COUNTER supplements until after surgery (Vitamin B12, Magnesium, Multivitamin, Vitamin D)  You may however, continue to take Tylenol if needed for pain up until the day of surgery.  Continue taking all of your other prescription medications up until the day of surgery.  ON THE DAY OF SURGERY ONLY TAKE THESE MEDICATIONS WITH SIPS OF WATER: -amLODipine (NORVASC)  -gabapentin (NEURONTIN)  -pantoprazole (PROTONIX)   No Alcohol for 24 hours before or after surgery.  No Smoking including e-cigarettes for 24 hours before surgery.  No chewable tobacco products for at least 6 hours before surgery.  No nicotine patches on the day of surgery.  Do not use any "recreational" drugs for at least a week (preferably 2 weeks) before your surgery.  Please be  advised that the combination of cocaine and anesthesia may have negative outcomes, up to and including death. If you test positive for cocaine, your surgery will be cancelled.  On the morning of surgery brush your teeth with toothpaste and water, you may rinse your mouth with mouthwash if you wish. Do not swallow any toothpaste or mouthwash.  Use CHG Soap as directed on instruction sheet.  Do not wear jewelry, make-up, hairpins, clips or nail polish.  For welded (permanent) jewelry: bracelets, anklets, waist bands, etc.  Please have this removed prior to surgery.  If it is not removed, there is a chance that hospital personnel will need to cut it off on the day of surgery.  Do not wear lotions, powders, or perfumes.   Do not shave body hair from the neck down 48 hours before surgery.  Contact lenses, hearing aids and dentures may not be worn into surgery.  Do not bring valuables to the hospital. [facility] is not responsible for any missing/lost belongings or valuables.  Notify your doctor if there is any change in your medical condition (cold, fever, infection).  Wear comfortable clothing (specific to your surgery type) to the hospital.  After surgery, you can help prevent lung complications by doing breathing exercises.  Take deep breaths and cough every 1-2 hours. Your doctor may order a device called an Incentive Spirometer to help you take deep breaths. When coughing or sneezing, hold a pillow firmly against your incision with both hands. This is called "splinting."  Doing this helps protect your incision. It also decreases belly discomfort.  If you are being admitted to the hospital overnight, leave your suitcase in the car. After surgery it may be brought to your room.  In case of increased patient census, it may be necessary for you, the patient, to continue your postoperative care in the Same Day Surgery department.  If you are being discharged the day of surgery, you will  not be allowed to drive home. You will need a responsible individual to drive you home and stay with you for 24 hours after surgery.   If you are taking public transportation, you will need to have a responsible individual with you.  Please call the Pre-admissions Testing Dept. at [phone] if you have any questions about these instructions.  Surgery Visitation Policy:  Patients having surgery or a procedure may have two visitors.  Children under the age of 85 must have an adult with them who is not the patient.  Inpatient Visitation:    Visiting hours are 7 a.m. to 8 p.m. Up to four visitors are allowed at one time in a patient room. The visitors may rotate out with other people during the day.  One visitor age 32 or older may stay with the patient overnight and must be in the room by 8 p.m.    Pre-operative 5 CHG Bath Instructions   You can play a key role in reducing the risk of infection after surgery. Your skin needs to be as free of germs as possible. You can reduce the number of germs on your skin by washing with CHG (chlorhexidine gluconate) soap before surgery. CHG is an antiseptic soap that kills germs and continues to kill germs even after washing.   DO NOT use if you have an allergy to chlorhexidine/CHG or antibacterial soaps. If your skin becomes reddened or irritated, stop using the CHG and notify one of our RNs at [phone].   Please shower with the CHG soap starting 4 days before surgery using the following schedule:     Please keep in mind the following:  DO NOT shave, including legs and underarms, starting the day of your first shower.   You may shave your face at any point before/day of surgery.  Place clean sheets on your bed the day you start using CHG soap. Use a clean washcloth (not used since being washed) for each shower. DO NOT sleep with pets once you start using the CHG.   CHG Shower Instructions:  If you choose to wash your hair and private  area, wash first with your normal shampoo/soap.  After you use shampoo/soap, rinse your hair and body thoroughly to remove shampoo/soap residue.  Turn the water OFF and apply about 3 tablespoons (45 ml) of CHG soap to a CLEAN washcloth.  Apply CHG soap ONLY FROM YOUR NECK DOWN TO YOUR TOES (washing for 3-5 minutes)  DO NOT use CHG soap on face, private areas, open wounds, or sores.  Pay special attention to the area where your surgery is being performed.  If you are having back surgery, having someone wash your back for you may be helpful. Wait 2 minutes after CHG soap is applied, then you may rinse off the CHG soap.  Pat dry with a clean towel  Put on clean clothes/pajamas   If you choose to wear lotion, please use ONLY the CHG-compatible lotions on the back of this paper.     Additional instructions for the day of  surgery: DO NOT APPLY any lotions, deodorants, cologne, or perfumes.   Put on clean/comfortable clothes.  Brush your teeth.  Ask your nurse before applying any prescription medications to the skin.      CHG Compatible Lotions   Aveeno Moisturizing lotion  Cetaphil Moisturizing Cream  Cetaphil Moisturizing Lotion  Clairol Herbal Essence Moisturizing Lotion, Dry Skin  Clairol Herbal Essence Moisturizing Lotion, Extra Dry Skin  Clairol Herbal Essence Moisturizing Lotion, Normal Skin  Curel Age Defying Therapeutic Moisturizing Lotion with Alpha Hydroxy  Curel Extreme Care Body Lotion  Curel Soothing Hands Moisturizing Hand Lotion  Curel Therapeutic Moisturizing Cream, Fragrance-Free  Curel Therapeutic Moisturizing Lotion, Fragrance-Free  Curel Therapeutic Moisturizing Lotion, Original Formula  Eucerin Daily Replenishing Lotion  Eucerin Dry Skin Therapy Plus Alpha Hydroxy Crme  Eucerin Dry Skin Therapy Plus Alpha Hydroxy Lotion  Eucerin Original Crme  Eucerin Original Lotion  Eucerin Plus Crme Eucerin Plus Lotion  Eucerin TriLipid Replenishing Lotion  Keri  Anti-Bacterial Hand Lotion  Keri Deep Conditioning Original Lotion Dry Skin Formula Softly Scented  Keri Deep Conditioning Original Lotion, Fragrance Free Sensitive Skin Formula  Keri Lotion Fast Absorbing Fragrance Free Sensitive Skin Formula  Keri Lotion Fast Absorbing Softly Scented Dry Skin Formula  Keri Original Lotion  Keri Skin Renewal Lotion Keri Silky Smooth Lotion  Keri Silky Smooth Sensitive Skin Lotion  Nivea Body Creamy Conditioning Oil  Nivea Body Extra Enriched Teacher, adult education Moisturizing Lotion Nivea Crme  Nivea Skin Firming Lotion  NutraDerm 30 Skin Lotion  NutraDerm Skin Lotion  NutraDerm Therapeutic Skin Cream  NutraDerm Therapeutic Skin Lotion  ProShield Protective Hand Cream  Provon moisturizing lotion

## 2023-01-13 ENCOUNTER — Telehealth: Payer: Self-pay

## 2023-01-13 DIAGNOSIS — N289 Disorder of kidney and ureter, unspecified: Secondary | ICD-10-CM

## 2023-01-13 NOTE — Telephone Encounter (Signed)
-----   Message from [facility] sent at 01/13/2023  4:41 PM EST ----- Regarding: RE: call report Yes ----- Message ----- From: Sharlot Gowda, RN Sent: 01/13/2023   4:40 PM EST To: Venetia Night, MD Subject: RE: call report                                Will do. I assume we will keep her on surgery schedule for 11/20 unless urology and/or oncology says otherwise ----- Message ----- From: Venetia Night, MD Sent: 01/13/2023   4:38 PM EST To: Sharlot Gowda, RN Subject: RE: call repot                                 I already saw it.  Can you call her and let her know I had to go home for a family issue (which is true) but that we will refer her to urology and oncology to work that up. They watch some of these. Not sure what they will do. Ok to put in the orders for referral ----- Message ----- From: Sharlot Gowda, RN Sent: 01/13/2023   9:00 AM EST To: Venetia Night, MD Subject: call repot                                     Patty said [facility] called to notify you about her MRI Abdomen. I see the report is in your results box.

## 2023-01-13 NOTE — Telephone Encounter (Signed)
No answer. Left message to return call. Referrals pended until I speak to patient.

## 2023-01-13 NOTE — Progress Notes (Signed)
Left message to return call 01/13/23. Referrals pended until I speak with the patient.

## 2023-01-16 NOTE — Telephone Encounter (Signed)
I spoke with Teresa Johns and informed her that her MRI abdomen report states there is a mass on her kidney that is concerning for renal cell carcinoma. I also informed her that Dr Myer Haff would like to refer her to urology and oncology for further workup of this. She is aware that we will leave her surgery on schedule for 01/25/23 unless urology or oncology tells [place] otherwise. She is also seeing Dr Marcello Fennel tomorrow. Referrals have been placed to [facility] [place] and [facility].

## 2023-01-17 ENCOUNTER — Inpatient Hospital Stay: Payer: No Typology Code available for payment source

## 2023-01-17 ENCOUNTER — Encounter: Payer: Self-pay | Admitting: Oncology

## 2023-01-17 ENCOUNTER — Inpatient Hospital Stay: Payer: No Typology Code available for payment source | Attending: Oncology | Admitting: Oncology

## 2023-01-17 VITALS — BP 124/67 | HR 73 | Temp 97.9°F | Resp 18 | Wt 157.2 lb

## 2023-01-17 DIAGNOSIS — R262 Difficulty in walking, not elsewhere classified: Secondary | ICD-10-CM | POA: Diagnosis not present

## 2023-01-17 DIAGNOSIS — N2889 Other specified disorders of kidney and ureter: Secondary | ICD-10-CM | POA: Diagnosis not present

## 2023-01-17 DIAGNOSIS — M170 Bilateral primary osteoarthritis of knee: Secondary | ICD-10-CM | POA: Diagnosis not present

## 2023-01-17 DIAGNOSIS — I10 Essential (primary) hypertension: Secondary | ICD-10-CM | POA: Diagnosis not present

## 2023-01-17 DIAGNOSIS — R7989 Other specified abnormal findings of blood chemistry: Secondary | ICD-10-CM | POA: Diagnosis not present

## 2023-01-17 DIAGNOSIS — K449 Diaphragmatic hernia without obstruction or gangrene: Secondary | ICD-10-CM | POA: Diagnosis not present

## 2023-01-17 DIAGNOSIS — Z0000 Encounter for general adult medical examination without abnormal findings: Secondary | ICD-10-CM | POA: Diagnosis not present

## 2023-01-17 DIAGNOSIS — M48062 Spinal stenosis, lumbar region with neurogenic claudication: Secondary | ICD-10-CM | POA: Diagnosis not present

## 2023-01-17 DIAGNOSIS — N289 Disorder of kidney and ureter, unspecified: Secondary | ICD-10-CM | POA: Diagnosis not present

## 2023-01-17 NOTE — Progress Notes (Signed)
Hematology/Oncology Consult Note [phone] [phone]     REFERRING PROVIDER: Venetia Night, MD    CHIEF COMPLAINTS/PURPOSE OF CONSULTATION:  Kidney mass  ASSESSMENT & PLAN:   Kidney mass MRI results were reviewed and discussed with patient.  Small left kidney lesion. I recommend urology evaluation.  Given the small size, patient's age, I think observation with surveillance images.  Patient will establish with urology for further evaluation. She will also defer CT images.  Given the small size of kidney mass with low risk of metastasis, I think is reasonable.  Follow-up as needed. All questions were answered. The patient knows to call the clinic with any problems, questions or concerns.  Rickard Patience, MD, PhD [facility] Hematology Oncology 01/17/2023    HISTORY OF PRESENTING ILLNESS:  Teresa Johns 88 y.o. female presents to establish care for kidney mass,.   Patient is undergoing workup for lower back pain and right leg pain.  Patient was seen by neurosurgeon Dr. Myer Haff 12/29/2022 MRI lumbar spine without contrast showed incidental finding of 1.3 cm indeterminate left renal lesion. 01/12/2023 MRI abdomen with and without contrast showed 1.2 x 1.0 cm T2 hypointense mass arising from the posterior inferior pole of the left kidney.  Concerning for small RCC.  No evidence of renal vein invasion, lymphadenopathy or metastatic disease in the abdomen.  Left adrenal adenoma cholelithiasis.  Patient was referred to oncology for further evaluation.  Patient denies any unintentional weight loss, night sweats, flank pain, hematuria.  She lives alone.  She was accompanied by her sister.  Family history positive for father with lymphoma.  No other cancer family history.  MEDICAL HISTORY:  Past Medical History:  Diagnosis Date   Adrenal adenoma, left    benign   Anemia    Colitis    Complication of anesthesia    bp dropped during EGD/during hand surgery bp  became elevated   GERD (gastroesophageal reflux disease)    Helicobacter pylori gastritis    Hiatal hernia    History of chicken pox    History of Helicobacter pylori infection    History of rectal bleeding    Hypertension    Low vitamin D level    Neuropathy    Osteoporosis    Presbyesophagus    Renal insufficiency    Seasonal allergies    Spinal stenosis of lumbar region    Tinnitus     SURGICAL HISTORY: Past Surgical History:  Procedure Laterality Date   ESOPHAGOGASTRODUODENOSCOPY N/A 11/20/2020   Procedure: ESOPHAGOGASTRODUODENOSCOPY (EGD);  Surgeon: Jaynie Collins, DO;  Location: [facility] ENDOSCOPY;  Service: Gastroenterology;  Laterality: N/A;   GANGLION CYST EXCISION     PAROTID STONE REMOVAL     ROTATOR CUFF TEAR SURGERY     SHOULDER SURGERY Right    VAGINAL HYSTERECTOMY     partial    SOCIAL HISTORY: Social History   Socioeconomic History   Marital status: Divorced    Spouse name: Not on file   Number of children: Not on file   Years of education: Not on file   Highest education level: Not on file  Occupational History   Not on file  Tobacco Use   Smoking status: Never   Smokeless tobacco: Never  Vaping Use   Vaping status: Never Used  Substance and Sexual Activity   Alcohol use: Never   Drug use: Never   Sexual activity: Not on file  Other Topics Concern   Not on file  Social History Narrative  Not on file   Social Determinants of Health   Financial Resource Strain: Low Risk  (09/13/2022)   Received from [facility]   Overall Financial Resource Strain (CARDIA)    Difficulty of Paying Living Expenses: Not hard at all  Food Insecurity: No Food Insecurity (01/17/2023)   Hunger Vital Sign    Worried About Running Out of Food in the Last Year: Never true    Ran Out of Food in the Last Year: Never true  Transportation Needs: No Transportation Needs (01/17/2023)   PRAPARE - Administrator, Civil Service (Medical):  No    Lack of Transportation (Non-Medical): No  Physical Activity: Not on file  Stress: Not on file  Social Connections: Not on file  Intimate Partner Violence: Not At Risk (01/17/2023)   Humiliation, Afraid, Rape, and Kick questionnaire    Fear of Current or Ex-Partner: No    Emotionally Abused: No    Physically Abused: No    Sexually Abused: No    FAMILY HISTORY: Family History  Problem Relation Age of Onset   Heart disease Mother    Lymphoma Father    Breast cancer Maternal Aunt 60    ALLERGIES:  has No Known Allergies.  MEDICATIONS:  Current Outpatient Medications  Medication Sig Dispense Refill   acetaminophen (TYLENOL) 500 MG tablet Take 1,000 mg by mouth every 6 (six) hours as needed for moderate pain (pain score 4-6) or mild pain (pain score 1-3).     acetaminophen (TYLENOL) 650 MG CR tablet Take 1,300 mg by mouth every 8 (eight) hours as needed for pain.     amLODipine (NORVASC) 10 MG tablet Take 10 mg by mouth every morning.     bismuth subsalicylate (PEPTO BISMOL) 262 MG chewable tablet Chew 524 mg by mouth daily as needed for indigestion.     calcium carbonate (TUMS EX) 750 MG chewable tablet Chew 1 tablet by mouth daily as needed for heartburn.     Chlorpheniramine Maleate (ALLERGY PO) Take 1 tablet by mouth daily as needed (allergy). Allergy relief     Cholecalciferol 25 MCG (1000 UT) tablet Take 1,000 Units by mouth daily.     Cyanocobalamin (VITAMIN B 12 PO) Take 1,000 mg by mouth daily.     diphenhydramine-acetaminophen (TYLENOL PM) 25-500 MG TABS tablet Take 2 tablets by mouth at bedtime as needed (sleep).     docusate sodium (COLACE) 100 MG capsule Take 100 mg by mouth daily.     gabapentin (NEURONTIN) 300 MG capsule Take 300 mg by mouth 3 (three) times daily.     HYDROcodone-acetaminophen (NORCO/VICODIN) 5-325 MG tablet Take 1 tablet by mouth every 6 (six) hours as needed (Pain).     losartan (COZAAR) 50 MG tablet Take 50 mg by mouth every morning.      MAGNESIUM PO Take 1 tablet by mouth daily as needed (Leg cramping). Gummy     Multiple Vitamins-Minerals (MULTIVITAMIN WITH MINERALS) tablet Take 1 tablet by mouth daily. With omega 3 gummy     naphazoline-glycerin (CLEAR EYES REDNESS) 0.012-0.25 % SOLN Place 1-2 drops into both eyes daily as needed for eye irritation.     pantoprazole (PROTONIX) 20 MG tablet Take 1 tablet (20 mg total) by mouth daily. (Patient taking differently: Take 40 mg by mouth every morning.) 30 tablet 1   Phenazopyridine HCl (AZO TABS PO) Take 1 tablet by mouth daily as needed (aggravated).     triamterene-hydrochlorothiazide (DYAZIDE) 37.5-25 MG capsule Take 1 capsule  by mouth every morning.     No current facility-administered medications for this visit.    Review of Systems  Constitutional:  Negative for appetite change, chills, fatigue and fever.  HENT:   Negative for hearing loss and voice change.   Eyes:  Negative for eye problems.  Respiratory:  Negative for chest tightness and cough.   Cardiovascular:  Negative for chest pain.  Gastrointestinal:  Negative for abdominal distention, abdominal pain and blood in stool.  Endocrine: Negative for hot flashes.  Genitourinary:  Negative for difficulty urinating and frequency.   Musculoskeletal:  Positive for back pain. Negative for arthralgias.  Skin:  Negative for itching and rash.  Neurological:  Negative for extremity weakness.  Hematological:  Negative for adenopathy.  Psychiatric/Behavioral:  Negative for confusion.      PHYSICAL EXAMINATION: ECOG PERFORMANCE STATUS: 1 - Symptomatic but completely ambulatory  Vitals:   01/17/23 1503  BP: 124/67  Pulse: 73  Resp: 18  Temp: 97.9 F (36.6 C)   Filed Weights   01/17/23 1503  Weight: 157 lb 3.2 oz (71.3 kg)    Physical Exam Constitutional:      General: She is not in acute distress.    Appearance: She is not diaphoretic.  HENT:     Head: Normocephalic and atraumatic.     Nose: Nose normal.      Mouth/Throat:     Pharynx: No oropharyngeal exudate.  Eyes:     General: No scleral icterus.    Pupils: Pupils are equal, round, and reactive to light.  Cardiovascular:     Rate and Rhythm: Normal rate and regular rhythm.     Heart sounds: No murmur heard. Pulmonary:     Effort: Pulmonary effort is normal. No respiratory distress.     Breath sounds: No rales.  Chest:     Chest wall: No tenderness.  Abdominal:     General: There is no distension.     Palpations: Abdomen is soft.     Tenderness: There is no abdominal tenderness.  Musculoskeletal:        General: Normal range of motion.     Cervical back: Normal range of motion and neck supple.     Right lower leg: No edema.     Left lower leg: Edema present.  Skin:    General: Skin is warm and dry.     Findings: No erythema.  Neurological:     Mental Status: She is alert and oriented to person, place, and time.     Cranial Nerves: No cranial nerve deficit.     Motor: No abnormal muscle tone.     Coordination: Coordination normal.  Psychiatric:        Mood and Affect: Affect normal.      LABORATORY DATA:  I have reviewed the data as listed    Latest Ref Rng & Units 01/12/2023   11:17 AM 10/13/2018   12:50 PM  CBC  WBC 4.0 - 10.5 K/uL 6.9  7.1   Hemoglobin 12.0 - 15.0 g/dL 81.1  91.4   Hematocrit 36.0 - 46.0 % 37.4  36.9   Platelets 150 - 400 K/uL 257  298       Latest Ref Rng & Units 01/12/2023   11:17 AM 10/13/2018    2:43 PM 10/13/2018   12:50 PM  CMP  Glucose 70 - 99 mg/dL 782   956   BUN 8 - 23 mg/dL 31   23   Creatinine 2.13 - 1.00  mg/dL 1.30   8.65   Sodium 784 - 145 mmol/L 140   141   Potassium 3.5 - 5.1 mmol/L 3.8   3.0   Chloride 98 - 111 mmol/L 108   106   CO2 22 - 32 mmol/L 23   25   Calcium 8.9 - 10.3 mg/dL 9.4   9.6   Total Protein 6.5 - 8.1 g/dL  7.5    Total Bilirubin 0.3 - 1.2 mg/dL  0.5    Alkaline Phos 38 - 126 U/L  59    AST 15 - 41 U/L  21    ALT 0 - 44 U/L  17       RADIOGRAPHIC  STUDIES: I have personally reviewed the radiological images as listed and agreed with the findings in the report. MR Abdomen W Wo Contrast  Result Date: 01/12/2023 CLINICAL DATA:  Characterize indeterminate renal lesions identified by prior MR lumbar spine EXAM: MRI ABDOMEN WITHOUT AND WITH CONTRAST TECHNIQUE: Multiplanar multisequence MR imaging of the abdomen was performed both before and after the administration of intravenous contrast. CONTRAST:  7mL GADAVIST GADOBUTROL 1 MMOL/ML IV SOLN COMPARISON:  MR lumbar spine, 07/08/2022 FINDINGS: Examination is significantly limited by breath motion artifact, particularly multiphasic contrast enhanced sequences. Lower chest: No acute abnormality. Hepatobiliary: No solid liver abnormality is seen. Numerous liver cysts of varying sizes, including a large cyst arising from the inferior left lobe of the liver queried by prior MR (series 4, image 17). Large gallstones. No gallbladder wall thickening, or biliary dilatation. Pancreas: Unremarkable. No pancreatic ductal dilatation or surrounding inflammatory changes. Spleen: Normal in size without significant abnormality. Adrenals/Urinary Tract: Definitively benign, macroscopic fat containing left adrenal adenoma, for which no further follow-up or characterization is required (series 5, image 32). Unfortunately, evaluation for solid renal lesion is significantly limited by breath motion artifact. Within this limitation, there is a T2 hypointense, solid-appearing and contrast enhancing lesion arising from the posterior inferior pole of the left kidney just inferior to a benign-appearing cyst measuring 1.2 x 1.0 cm (series 19, image 61, series 3, image 9) multiple additional bilateral fluid signal parapelvic and renal cortical cysts, benign, requiring no further follow-up or characterization. Kidneys are otherwise normal, without obvious renal calculi, solid lesion, or hydronephrosis. Stomach/Bowel: Stomach is within normal  limits. No evidence of bowel wall thickening, distention, or inflammatory changes. Vascular/Lymphatic: Aortic atherosclerosis. No enlarged abdominal lymph nodes. Other: No abdominal wall hernia or abnormality. No ascites. Musculoskeletal: No acute or significant osseous findings. IMPRESSION: 1. Examination is significantly limited by breath motion artifact, particularly multiphasic contrast enhanced sequences. 2. Within this limitation, there is a T2 hypointense, apparent mass arising from the posterior inferior pole of the left kidney measuring 1.2 x 1.0 cm. 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 Given limitations of today's examination, multiphasic contrast enhanced CT may be helpful as a confirmatory study, as well as for future follow-up as this modality is significantly more robust to breath motion artifact. 3. No evidence of renal vein invasion, lymphadenopathy or metastatic disease in the abdomen. 4. Definitively benign, macroscopic fat containing left adrenal adenoma, for which no further follow-up or characterization is required. 5. Cholelithiasis. These results will be called to the ordering clinician or representative by the Radiologist Assistant, and communication documented in the PACS or [employer]. Electronically Signed   By: Jearld Lesch M.D.   On: 01/12/2023 18:05

## 2023-01-17 NOTE — Assessment & Plan Note (Signed)
MRI results were reviewed and discussed with patient.  Small left kidney lesion. I recommend urology evaluation.  Given the small size, patient's age, I think observation with surveillance images.  Patient will establish with urology for further evaluation. She will also defer CT images.  Given the small size of kidney mass with low risk of metastasis, I think is reasonable.

## 2023-01-19 ENCOUNTER — Ambulatory Visit: Payer: Self-pay | Admitting: Urology

## 2023-01-19 ENCOUNTER — Encounter: Payer: Self-pay | Admitting: Urology

## 2023-01-19 VITALS — BP 151/61 | HR 87 | Ht 63.0 in | Wt 153.0 lb

## 2023-01-19 DIAGNOSIS — N289 Disorder of kidney and ureter, unspecified: Secondary | ICD-10-CM | POA: Diagnosis not present

## 2023-01-19 LAB — URINALYSIS, COMPLETE
Bilirubin, UA: NEGATIVE
Glucose, UA: NEGATIVE
Nitrite, UA: NEGATIVE
Protein,UA: NEGATIVE
Specific Gravity, UA: 1.02 (ref 1.005–1.030)
Urobilinogen, Ur: 0.2 mg/dL (ref 0.2–1.0)
pH, UA: 5.5 (ref 5.0–7.5)

## 2023-01-19 LAB — MICROSCOPIC EXAMINATION: Epithelial Cells (non renal): >10 /HPF — AB (ref 0–10)

## 2023-01-19 NOTE — Progress Notes (Signed)
I,Amy L Pierron,acting as a scribe for Riki Altes, MD.,have documented all relevant documentation on the behalf of Riki Altes, MD,as directed by  Riki Altes, MD while in the presence of Riki Altes, MD.  01/19/2023 3:33 PM   Teresa Johns 1934/05/22 [id number]  Referring provider: Venetia Night, MD [address] [place],  [place] [address]  Chief Complaint  Patient presents with   Other    Renal lesion     HPI: Teresa Johns is a 88 y.o. female referred for evaluation of left renal mass.  She presents today with her daughter-in-law.  Lumbar disc disease and lumbar spine MRI ordered by Dr. Myer Haff incidentally showed a 1.3 centimeter indeterminate left renal lesion. A renal mass protocol MRI was subsequently performed, which showed a 1.2 by 1.0 centimeter enhancing lesion on the posterior inferior pole of the left kidney consistent with a small papillary renal cell carcinoma, although there were some limitations due to motion artifact.  She has mild OAB symptoms.  No gross hematuria, flank, or abdominal pain.  She was referred to Dr. Cathie Hoops, who saw her early this week and recommended surveillance. She was referred for urologic opinion.  PMH: Past Medical History:  Diagnosis Date   Adrenal adenoma, left    benign   Anemia    Colitis    Complication of anesthesia    bp dropped during EGD/during hand surgery bp became elevated   GERD (gastroesophageal reflux disease)    Helicobacter pylori gastritis    Hiatal hernia    History of chicken pox    History of Helicobacter pylori infection    History of rectal bleeding    Hypertension    Low vitamin D level    Neuropathy    Osteoporosis    Presbyesophagus    Renal insufficiency    Seasonal allergies    Spinal stenosis of lumbar region    Tinnitus     Surgical History: Past Surgical History:  Procedure Laterality Date   ESOPHAGOGASTRODUODENOSCOPY N/A 11/20/2020   Procedure:  ESOPHAGOGASTRODUODENOSCOPY (EGD);  Surgeon: Jaynie Collins, DO;  Location: [facility] ENDOSCOPY;  Service: Gastroenterology;  Laterality: N/A;   GANGLION CYST EXCISION     PAROTID STONE REMOVAL     ROTATOR CUFF TEAR SURGERY     SHOULDER SURGERY Right    VAGINAL HYSTERECTOMY     partial    Home Medications:  Allergies as of 01/19/2023   No Known Allergies      Medication List        Accurate as of January 19, 2023  3:33 PM. If you have any questions, ask your nurse or doctor.          acetaminophen 500 MG tablet Commonly known as: TYLENOL Take 1,000 mg by mouth every 6 (six) hours as needed for moderate pain (pain score 4-6) or mild pain (pain score 1-3).   acetaminophen 650 MG CR tablet Commonly known as: TYLENOL Take 1,300 mg by mouth every 8 (eight) hours as needed for pain.   ALLERGY PO Take 1 tablet by mouth daily as needed (allergy). Allergy relief   amLODipine 10 MG tablet Commonly known as: NORVASC Take 10 mg by mouth every morning.   AZO TABS PO Take 1 tablet by mouth daily as needed (aggravated).   bismuth subsalicylate 262 MG chewable tablet Commonly known as: PEPTO BISMOL Chew 524 mg by mouth daily as needed for indigestion.   calcium carbonate 750 MG chewable  tablet Commonly known as: TUMS EX Chew 1 tablet by mouth daily as needed for heartburn.   Cholecalciferol 25 MCG (1000 UT) tablet Take 1,000 Units by mouth daily.   diphenhydramine-acetaminophen 25-500 MG Tabs tablet Commonly known as: TYLENOL PM Take 2 tablets by mouth at bedtime as needed (sleep).   docusate sodium 100 MG capsule Commonly known as: COLACE Take 100 mg by mouth daily.   gabapentin 300 MG capsule Commonly known as: NEURONTIN Take 300 mg by mouth 3 (three) times daily.   HYDROcodone-acetaminophen 5-325 MG tablet Commonly known as: NORCO/VICODIN Take 1 tablet by mouth every 6 (six) hours as needed (Pain).   losartan 50 MG tablet Commonly known as: COZAAR Take 50  mg by mouth every morning.   MAGNESIUM PO Take 1 tablet by mouth daily as needed (Leg cramping). Gummy   multivitamin with minerals tablet Take 1 tablet by mouth daily. With omega 3 gummy   naphazoline-glycerin 0.012-0.25 % Soln Commonly known as: CLEAR EYES REDNESS Place 1-2 drops into both eyes daily as needed for eye irritation.   pantoprazole 20 MG tablet Commonly known as: Protonix Take 1 tablet (20 mg total) by mouth daily. What changed:  how much to take when to take this   triamterene-hydrochlorothiazide 37.5-25 MG capsule Commonly known as: DYAZIDE Take 1 capsule by mouth every morning.   VITAMIN B 12 PO Take 1,000 mg by mouth daily.        Allergies: No Known Allergies  Family History: Family History  Problem Relation Age of Onset   Heart disease Mother    Lymphoma Father    Breast cancer Maternal Aunt 22    Social History:  reports that she has never smoked. She has never used smokeless tobacco. She reports that she does not drink alcohol and does not use drugs.   Physical Exam: BP (!) 151/61   Pulse 87   Ht 5\' 3"  (1.6 m)   Wt 153 lb (69.4 kg)   BMI 27.10 kg/m   Constitutional:  Alert and oriented, No acute distress. HEENT: [place] AT Respiratory: Normal respiratory effort, no increased work of breathing. Psychiatric: Normal mood and affect.   Pertinent Imaging:  MRI was personally reviewed and interpreted and agree with radiology interpretation.   Assessment & Plan:    1. Left renal mass  We discussed this most likely is a small renal cell carcinoma (70% chance) and that the small papillary renal cell carcinomas are typically indolent and rarely cause problems. Based on age and mass size, recommend surveillance. Other options were discussed, including percutaneous ablation. She did not tolerate the MRI well and will schedule a follow-up CT renal mass protocol in six months with follow-up office visit.   I have reviewed the above documentation  for accuracy and completeness, and I agree with the above.   Riki Altes, MD  [facility] [address] [place], [place] [address] [phone]

## 2023-01-20 ENCOUNTER — Encounter: Payer: Self-pay | Admitting: Urology

## 2023-01-24 MED ORDER — CEFAZOLIN IN SODIUM CHLORIDE 2-0.9 GM/100ML-% IV SOLN
2.0000 g | Freq: Once | INTRAVENOUS | Status: DC
  Filled 2023-01-24: qty 100

## 2023-01-24 MED ORDER — ORAL CARE MOUTH RINSE: 15.0000 mL | Freq: Once | OROMUCOSAL | Status: AC

## 2023-01-24 MED ORDER — CEFAZOLIN SODIUM-DEXTROSE 2-4 GM/100ML-% IV SOLN
2.0000 g | INTRAVENOUS | Status: AC
  Administered 2023-01-25: 2 g via INTRAVENOUS

## 2023-01-24 MED ORDER — CHLORHEXIDINE GLUCONATE 0.12 % MT SOLN
15.0000 mL | Freq: Once | OROMUCOSAL | Status: AC
  Administered 2023-01-25: 15 mL via OROMUCOSAL

## 2023-01-24 MED ORDER — LACTATED RINGERS IV SOLN
INTRAVENOUS | Status: DC
Start: 2023-01-24 — End: 2023-01-25

## 2023-01-25 ENCOUNTER — Ambulatory Visit: Payer: No Typology Code available for payment source | Admitting: Certified Registered Nurse Anesthetist

## 2023-01-25 ENCOUNTER — Other Ambulatory Visit: Payer: Self-pay

## 2023-01-25 ENCOUNTER — Observation Stay
Admission: RE | Admit: 2023-01-25 | Discharge: 2023-01-26 | Disposition: A | Discharge status: Home / Self Care | Payer: No Typology Code available for payment source | Attending: Neurosurgery | Admitting: Neurosurgery

## 2023-01-25 ENCOUNTER — Encounter: Payer: Self-pay | Admitting: Neurosurgery

## 2023-01-25 ENCOUNTER — Ambulatory Visit: Payer: No Typology Code available for payment source | Admitting: Urgent Care

## 2023-01-25 ENCOUNTER — Ambulatory Visit: Payer: No Typology Code available for payment source

## 2023-01-25 ENCOUNTER — Encounter
Admission: RE | Disposition: A | Discharge status: Home / Self Care | Payer: Self-pay | Source: Home / Self Care | Attending: Neurosurgery

## 2023-01-25 DIAGNOSIS — Z79899 Other long term (current) drug therapy: Secondary | ICD-10-CM | POA: Insufficient documentation

## 2023-01-25 DIAGNOSIS — M48061 Spinal stenosis, lumbar region without neurogenic claudication: Principal | ICD-10-CM | POA: Diagnosis present

## 2023-01-25 DIAGNOSIS — Z01818 Encounter for other preprocedural examination: Secondary | ICD-10-CM

## 2023-01-25 DIAGNOSIS — M48062 Spinal stenosis, lumbar region with neurogenic claudication: Principal | ICD-10-CM

## 2023-01-25 DIAGNOSIS — I10 Essential (primary) hypertension: Secondary | ICD-10-CM | POA: Insufficient documentation

## 2023-01-25 DIAGNOSIS — M4807 Spinal stenosis, lumbosacral region: Secondary | ICD-10-CM | POA: Diagnosis not present

## 2023-01-25 HISTORY — PX: LUMBAR LAMINECTOMY/DECOMPRESSION MICRODISCECTOMY: SHX5026

## 2023-01-25 LAB — ABO/RH: ABO/RH(D): A POS

## 2023-01-25 SURGERY — LUMBAR LAMINECTOMY/DECOMPRESSION MICRODISCECTOMY 4 LEVEL
Anesthesia: General | Site: Spine Lumbar

## 2023-01-25 MED ORDER — METHYLPREDNISOLONE ACETATE 40 MG/ML IJ SUSP
INTRAMUSCULAR | Status: DC | PRN
  Administered 2023-01-25: 40 mg

## 2023-01-25 MED ORDER — AMLODIPINE BESYLATE 5 MG PO TABS
10.0000 mg | ORAL_TABLET | ORAL | Status: DC
  Administered 2023-01-26: 10 mg via ORAL

## 2023-01-25 MED ORDER — MENTHOL 3 MG MT LOZG
1.0000 | LOZENGE | OROMUCOSAL | Status: DC | PRN
Start: 2023-01-25 — End: 2023-01-26

## 2023-01-25 MED ORDER — OXYCODONE HCL 5 MG/5ML PO SOLN: 5.0000 mg | Freq: Once | ORAL | Status: AC | PRN

## 2023-01-25 MED ORDER — DEXAMETHASONE SODIUM PHOSPHATE 10 MG/ML IJ SOLN
INTRAMUSCULAR | Status: DC | PRN
  Administered 2023-01-25: 10 mg via INTRAVENOUS

## 2023-01-25 MED ORDER — DOCUSATE SODIUM 100 MG PO CAPS
ORAL_CAPSULE | ORAL | Status: AC
  Filled 2023-01-25: qty 1

## 2023-01-25 MED ORDER — GLYCOPYRROLATE 0.2 MG/ML IJ SOLN
INTRAMUSCULAR | Status: AC
  Filled 2023-01-25: qty 1

## 2023-01-25 MED ORDER — CEFAZOLIN SODIUM-DEXTROSE 2-4 GM/100ML-% IV SOLN
INTRAVENOUS | Status: AC
  Filled 2023-01-25: qty 100

## 2023-01-25 MED ORDER — BUPIVACAINE HCL (PF) 0.5 % IJ SOLN
INTRAMUSCULAR | Status: AC
Start: 2023-01-25 — End: ?
  Filled 2023-01-25: qty 30

## 2023-01-25 MED ORDER — GABAPENTIN 300 MG PO CAPS
300.0000 mg | ORAL_CAPSULE | Freq: Three times a day (TID) | ORAL | Status: DC
  Administered 2023-01-25 – 2023-01-26 (×2): 300 mg via ORAL

## 2023-01-25 MED ORDER — BUPIVACAINE LIPOSOME 1.3 % IJ SUSP
INTRAMUSCULAR | Status: AC
  Filled 2023-01-25: qty 20

## 2023-01-25 MED ORDER — MORPHINE SULFATE (PF) 4 MG/ML IV SOLN: 1.0000 mg | INTRAVENOUS | Status: AC | PRN

## 2023-01-25 MED ORDER — METHOCARBAMOL 1000 MG/10ML IJ SOLN: 500.0000 mg | Freq: Four times a day (QID) | INTRAMUSCULAR | Status: DC | PRN

## 2023-01-25 MED ORDER — FENTANYL CITRATE (PF) 100 MCG/2ML IJ SOLN
25.0000 ug | INTRAMUSCULAR | Status: DC | PRN
Start: 2023-01-25 — End: 2023-01-25
  Administered 2023-01-25: 25 ug via INTRAVENOUS

## 2023-01-25 MED ORDER — SODIUM CHLORIDE 0.9% FLUSH
3.0000 mL | Freq: Two times a day (BID) | INTRAVENOUS | Status: DC
  Administered 2023-01-25 – 2023-01-26 (×2): 3 mL via INTRAVENOUS

## 2023-01-25 MED ORDER — ACETAMINOPHEN 10 MG/ML IV SOLN
INTRAVENOUS | Status: AC
  Filled 2023-01-25: qty 100

## 2023-01-25 MED ORDER — ACETAMINOPHEN 325 MG PO TABS: 650.0000 mg | ORAL_TABLET | ORAL | Status: DC | PRN

## 2023-01-25 MED ORDER — OXYCODONE HCL 5 MG PO TABS: 10.0000 mg | ORAL_TABLET | ORAL | Status: DC | PRN

## 2023-01-25 MED ORDER — FENTANYL CITRATE (PF) 100 MCG/2ML IJ SOLN
INTRAMUSCULAR | Status: DC | PRN
  Administered 2023-01-25 (×5): 25 ug via INTRAVENOUS
  Administered 2023-01-25: 50 ug via INTRAVENOUS

## 2023-01-25 MED ORDER — ROCURONIUM BROMIDE 100 MG/10ML IV SOLN
INTRAVENOUS | Status: DC | PRN
  Administered 2023-01-25: 10 mg via INTRAVENOUS
  Administered 2023-01-25: 5 mg via INTRAVENOUS
  Administered 2023-01-25 (×2): 10 mg via INTRAVENOUS

## 2023-01-25 MED ORDER — PHENYLEPHRINE HCL-NACL 20-0.9 MG/250ML-% IV SOLN
INTRAVENOUS | Status: DC | PRN
  Administered 2023-01-25: 15 ug/min via INTRAVENOUS

## 2023-01-25 MED ORDER — ONDANSETRON HCL 4 MG/2ML IJ SOLN
INTRAMUSCULAR | Status: DC | PRN
  Administered 2023-01-25: 4 mg via INTRAVENOUS

## 2023-01-25 MED ORDER — CALCIUM CARBONATE ANTACID 500 MG PO CHEW: 1.0000 | CHEWABLE_TABLET | Freq: Every day | ORAL | Status: DC | PRN

## 2023-01-25 MED ORDER — BUPIVACAINE-EPINEPHRINE (PF) 0.5% -1:200000 IJ SOLN
INTRAMUSCULAR | Status: DC | PRN
  Administered 2023-01-25: 10 mL

## 2023-01-25 MED ORDER — METHOCARBAMOL 500 MG PO TABS
500.0000 mg | ORAL_TABLET | Freq: Four times a day (QID) | ORAL | Status: DC | PRN
  Administered 2023-01-25: 500 mg via ORAL

## 2023-01-25 MED ORDER — PHENOL 1.4 % MT LIQD
1.0000 | OROMUCOSAL | Status: DC | PRN
Start: 2023-01-25 — End: 2023-01-26

## 2023-01-25 MED ORDER — MAGNESIUM CITRATE PO SOLN: 1.0000 | Freq: Once | ORAL | Status: DC | PRN

## 2023-01-25 MED ORDER — ACETAMINOPHEN 10 MG/ML IV SOLN: 1000.0000 mg | Freq: Once | INTRAVENOUS | Status: DC | PRN

## 2023-01-25 MED ORDER — ACETAMINOPHEN 500 MG PO TABS
1000.0000 mg | ORAL_TABLET | Freq: Four times a day (QID) | ORAL | Status: DC
Start: 2023-01-25 — End: 2023-02-18
  Administered 2023-01-25 – 2023-01-26 (×3): 1000 mg via ORAL

## 2023-01-25 MED ORDER — ROCURONIUM BROMIDE 10 MG/ML (PF) SYRINGE
PREFILLED_SYRINGE | INTRAVENOUS | Status: AC
  Filled 2023-01-25: qty 10

## 2023-01-25 MED ORDER — ACETAMINOPHEN 10 MG/ML IV SOLN
INTRAVENOUS | Status: DC | PRN
  Administered 2023-01-25: 1000 mg via INTRAVENOUS

## 2023-01-25 MED ORDER — OXYCODONE HCL 5 MG PO TABS
ORAL_TABLET | ORAL | Status: AC
  Filled 2023-01-25: qty 1

## 2023-01-25 MED ORDER — GLYCOPYRROLATE 0.2 MG/ML IJ SOLN
INTRAMUSCULAR | Status: DC | PRN
  Administered 2023-01-25: .2 mg via INTRAVENOUS

## 2023-01-25 MED ORDER — FENTANYL CITRATE (PF) 100 MCG/2ML IJ SOLN
INTRAMUSCULAR | Status: AC
  Filled 2023-01-25: qty 2

## 2023-01-25 MED ORDER — SUCCINYLCHOLINE CHLORIDE 200 MG/10ML IV SOSY
PREFILLED_SYRINGE | INTRAVENOUS | Status: DC | PRN
  Administered 2023-01-25: 70 mg via INTRAVENOUS

## 2023-01-25 MED ORDER — SENNA 8.6 MG PO TABS
1.0000 | ORAL_TABLET | Freq: Two times a day (BID) | ORAL | Status: DC
  Administered 2023-01-25 – 2023-01-26 (×2): 8.6 mg via ORAL

## 2023-01-25 MED ORDER — POLYETHYLENE GLYCOL 3350 17 G PO PACK
17.0000 g | PACK | Freq: Every day | ORAL | Status: DC | PRN
Start: 2023-01-25 — End: 2023-01-26

## 2023-01-25 MED ORDER — SURGIFLO WITH THROMBIN (HEMOSTATIC MATRIX KIT) OPTIME
TOPICAL | Status: DC | PRN
  Administered 2023-01-25: 1 via TOPICAL

## 2023-01-25 MED ORDER — OXYCODONE HCL 5 MG PO TABS
5.0000 mg | ORAL_TABLET | ORAL | Status: DC | PRN
  Administered 2023-01-25: 5 mg via ORAL

## 2023-01-25 MED ORDER — PROPOFOL 10 MG/ML IV BOLUS
INTRAVENOUS | Status: DC | PRN
  Administered 2023-01-25: 10 mg via INTRAVENOUS
  Administered 2023-01-25: 70 mg via INTRAVENOUS

## 2023-01-25 MED ORDER — DOCUSATE SODIUM 100 MG PO CAPS
100.0000 mg | ORAL_CAPSULE | Freq: Two times a day (BID) | ORAL | Status: DC
  Administered 2023-01-25 – 2023-01-26 (×2): 100 mg via ORAL

## 2023-01-25 MED ORDER — SUCCINYLCHOLINE CHLORIDE 200 MG/10ML IV SOSY
PREFILLED_SYRINGE | INTRAVENOUS | Status: AC
  Filled 2023-01-25: qty 10

## 2023-01-25 MED ORDER — SUGAMMADEX SODIUM 200 MG/2ML IV SOLN
INTRAVENOUS | Status: DC | PRN
  Administered 2023-01-25: 50 mg via INTRAVENOUS
  Administered 2023-01-25: 30 mg via INTRAVENOUS
  Administered 2023-01-25: 60 mg via INTRAVENOUS

## 2023-01-25 MED ORDER — ACETAMINOPHEN 500 MG PO TABS
ORAL_TABLET | ORAL | Status: AC
  Filled 2023-01-25: qty 2

## 2023-01-25 MED ORDER — SODIUM CHLORIDE 0.9% FLUSH
3.0000 mL | INTRAVENOUS | Status: DC | PRN
Start: 2023-01-25 — End: 2023-01-26

## 2023-01-25 MED ORDER — ENOXAPARIN SODIUM 40 MG/0.4ML IJ SOSY
40.0000 mg | PREFILLED_SYRINGE | INTRAMUSCULAR | Status: DC
  Administered 2023-01-26: 40 mg via SUBCUTANEOUS

## 2023-01-25 MED ORDER — PHENYLEPHRINE 80 MCG/ML (10ML) SYRINGE FOR IV PUSH (FOR BLOOD PRESSURE SUPPORT)
PREFILLED_SYRINGE | INTRAVENOUS | Status: AC
  Filled 2023-01-25: qty 20

## 2023-01-25 MED ORDER — PHENYLEPHRINE 80 MCG/ML (10ML) SYRINGE FOR IV PUSH (FOR BLOOD PRESSURE SUPPORT)
PREFILLED_SYRINGE | INTRAVENOUS | Status: DC | PRN
  Administered 2023-01-25: 40 ug via INTRAVENOUS
  Administered 2023-01-25: 80 ug via INTRAVENOUS
  Administered 2023-01-25: 40 ug via INTRAVENOUS

## 2023-01-25 MED ORDER — MAGNESIUM OXIDE -MG SUPPLEMENT 400 (240 MG) MG PO TABS: 400.0000 mg | ORAL_TABLET | Freq: Every day | ORAL | Status: DC | PRN

## 2023-01-25 MED ORDER — BUPIVACAINE-EPINEPHRINE (PF) 0.5% -1:200000 IJ SOLN
INTRAMUSCULAR | Status: AC
  Filled 2023-01-25: qty 30

## 2023-01-25 MED ORDER — CHLORHEXIDINE GLUCONATE 0.12 % MT SOLN
OROMUCOSAL | Status: AC
Start: 2023-01-25 — End: ?
  Filled 2023-01-25: qty 15

## 2023-01-25 MED ORDER — 0.9 % SODIUM CHLORIDE (POUR BTL) OPTIME
TOPICAL | Status: DC | PRN
  Administered 2023-01-25: 500 mL

## 2023-01-25 MED ORDER — METHYLPREDNISOLONE ACETATE 40 MG/ML IJ SUSP
INTRAMUSCULAR | Status: AC
  Filled 2023-01-25: qty 1

## 2023-01-25 MED ORDER — TRIAMTERENE-HCTZ 37.5-25 MG PO TABS
1.0000 | ORAL_TABLET | Freq: Every morning | ORAL | Status: DC
  Administered 2023-01-26: 1 via ORAL
  Filled 2023-01-25: qty 1

## 2023-01-25 MED ORDER — GABAPENTIN 300 MG PO CAPS
ORAL_CAPSULE | ORAL | Status: AC
  Filled 2023-01-25: qty 1

## 2023-01-25 MED ORDER — LACTATED RINGERS IV SOLN: INTRAVENOUS | Status: DC | PRN

## 2023-01-25 MED ORDER — ONDANSETRON HCL 4 MG PO TABS: 4.0000 mg | ORAL_TABLET | Freq: Four times a day (QID) | ORAL | Status: DC | PRN

## 2023-01-25 MED ORDER — MAGNESIUM 200 MG PO TABS: ORAL_TABLET | Freq: Every day | ORAL | Status: DC | PRN

## 2023-01-25 MED ORDER — ONDANSETRON HCL 4 MG/2ML IJ SOLN: 4.0000 mg | Freq: Once | INTRAMUSCULAR | Status: DC | PRN

## 2023-01-25 MED ORDER — ACETAMINOPHEN 650 MG RE SUPP: 650.0000 mg | RECTAL | Status: DC | PRN

## 2023-01-25 MED ORDER — LIDOCAINE HCL (PF) 2 % IJ SOLN
INTRAMUSCULAR | Status: AC
  Filled 2023-01-25: qty 5

## 2023-01-25 MED ORDER — LIDOCAINE HCL (CARDIAC) PF 100 MG/5ML IV SOSY
PREFILLED_SYRINGE | INTRAVENOUS | Status: DC | PRN
  Administered 2023-01-25: 80 mg via INTRAVENOUS

## 2023-01-25 MED ORDER — OXYCODONE HCL 5 MG PO TABS
5.0000 mg | ORAL_TABLET | Freq: Once | ORAL | Status: AC | PRN
  Administered 2023-01-25: 5 mg via ORAL

## 2023-01-25 MED ORDER — DEXAMETHASONE SODIUM PHOSPHATE 10 MG/ML IJ SOLN
INTRAMUSCULAR | Status: AC
  Filled 2023-01-25: qty 1

## 2023-01-25 MED ORDER — SORBITOL 70 % SOLN
30.0000 mL | Freq: Every day | Status: DC | PRN
Start: 2023-01-25 — End: 2023-01-26

## 2023-01-25 MED ORDER — LOSARTAN POTASSIUM 50 MG PO TABS
50.0000 mg | ORAL_TABLET | ORAL | Status: DC
  Administered 2023-01-26: 50 mg via ORAL

## 2023-01-25 MED ORDER — SENNA 8.6 MG PO TABS
ORAL_TABLET | ORAL | Status: AC
  Filled 2023-01-25: qty 1

## 2023-01-25 MED ORDER — PHENYLEPHRINE HCL-NACL 20-0.9 MG/250ML-% IV SOLN
INTRAVENOUS | Status: AC
  Filled 2023-01-25: qty 250

## 2023-01-25 MED ORDER — BISMUTH SUBSALICYLATE 262 MG PO CHEW: 524.0000 mg | CHEWABLE_TABLET | Freq: Every day | ORAL | Status: DC | PRN

## 2023-01-25 MED ORDER — ONDANSETRON HCL 4 MG/2ML IJ SOLN
4.0000 mg | Freq: Four times a day (QID) | INTRAMUSCULAR | Status: DC | PRN
  Administered 2023-01-26: 4 mg via INTRAVENOUS

## 2023-01-25 MED ORDER — METHOCARBAMOL 500 MG PO TABS
ORAL_TABLET | ORAL | Status: AC
  Filled 2023-01-25: qty 1

## 2023-01-25 MED ORDER — SODIUM CHLORIDE FLUSH 0.9 % IV SOLN
INTRAVENOUS | Status: AC
  Filled 2023-01-25: qty 20

## 2023-01-25 MED ORDER — SODIUM CHLORIDE 0.9 % IV SOLN
250.0000 mL | INTRAVENOUS | Status: DC
  Administered 2023-01-25: 250 mL via INTRAVENOUS

## 2023-01-25 MED ORDER — SODIUM CHLORIDE (PF) 0.9 % IJ SOLN
INTRAMUSCULAR | Status: DC | PRN
  Administered 2023-01-25: 60 mL via INTRAMUSCULAR

## 2023-01-25 MED ORDER — PANTOPRAZOLE SODIUM 40 MG PO TBEC
40.0000 mg | DELAYED_RELEASE_TABLET | ORAL | Status: DC
  Administered 2023-01-26: 40 mg via ORAL

## 2023-01-25 SURGICAL SUPPLY — 38 items
BASIN KIT SINGLE STR (MISCELLANEOUS) ×1 IMPLANT
BUR NEURO DRILL SOFT 3.0X3.8M (BURR) ×1 IMPLANT
CNTNR URN SCR LID CUP LEK RST (MISCELLANEOUS) IMPLANT
DERMABOND ADVANCED .7 DNX12 (GAUZE/BANDAGES/DRESSINGS) ×1 IMPLANT
DRAPE C ARM PK CFD 31 SPINE (DRAPES) ×1 IMPLANT
DRAPE C-ARM XRAY 36X54 (DRAPES) IMPLANT
DRAPE LAPAROTOMY 100X77 ABD (DRAPES) ×1 IMPLANT
DRAPE MICROSCOPE SPINE 48X150 (DRAPES) IMPLANT
DRSG OPSITE POSTOP 4X6 (GAUZE/BANDAGES/DRESSINGS) IMPLANT
ELECT EZSTD 165MM 6.5IN (MISCELLANEOUS) ×1
ELECT REM PT RETURN 9FT ADLT (ELECTROSURGICAL) ×1
ELECTRODE EZSTD 165MM 6.5IN (MISCELLANEOUS) ×1 IMPLANT
ELECTRODE REM PT RTRN 9FT ADLT (ELECTROSURGICAL) ×1 IMPLANT
EVACUATOR 1/8 PVC DRAIN (DRAIN) IMPLANT
GAUZE SPONGE 2X2 STRL 8-PLY (GAUZE/BANDAGES/DRESSINGS) IMPLANT
GLOVE BIOGEL PI IND STRL 6.5 (GLOVE) ×1 IMPLANT
GLOVE SURG SYN 6.5 ES PF (GLOVE) ×1 IMPLANT
GLOVE SURG SYN 6.5 PF PI (GLOVE) ×1 IMPLANT
GLOVE SURG SYN 8.5 E (GLOVE) ×3 IMPLANT
GLOVE SURG SYN 8.5 PF PI (GLOVE) ×3 IMPLANT
GOWN SRG LRG LVL 4 IMPRV REINF (GOWNS) ×1 IMPLANT
GOWN SRG XL LVL 3 NONREINFORCE (GOWNS) ×1 IMPLANT
KIT SPINAL PRONEVIEW (KITS) ×1 IMPLANT
MANIFOLD NEPTUNE II (INSTRUMENTS) ×1 IMPLANT
MARKER SKIN DUAL TIP RULER LAB (MISCELLANEOUS) ×1 IMPLANT
NDL SAFETY ECLIPSE 18X1.5 (NEEDLE) ×1 IMPLANT
NS IRRIG 500ML POUR BTL (IV SOLUTION) ×1 IMPLANT
PACK LAMINECTOMY ARMC (PACKS) ×1 IMPLANT
SURGIFLO W/THROMBIN 8M KIT (HEMOSTASIS) ×1 IMPLANT
SUT ETHILON 3-0 FS-10 30 BLK (SUTURE) ×1
SUT STRATA 3-0 15 PS-2 (SUTURE) ×1 IMPLANT
SUT VIC AB 0 CT1 27XCR 8 STRN (SUTURE) ×1 IMPLANT
SUT VIC AB 2-0 CT1 18 (SUTURE) ×1 IMPLANT
SUTURE EHLN 3-0 FS-10 30 BLK (SUTURE) IMPLANT
SYR 30ML LL (SYRINGE) IMPLANT
SYR 3ML LL SCALE MARK (SYRINGE) ×2 IMPLANT
TRAP FLUID SMOKE EVACUATOR (MISCELLANEOUS) ×1 IMPLANT
WATER STERILE IRR 500ML POUR (IV SOLUTION) ×1 IMPLANT

## 2023-01-25 NOTE — Discharge Instructions (Signed)
Your surgeon has performed an operation on your lumbar spine (low back) to relieve pressure on one or more nerves. Many times, patients feel better immediately after surgery and can "overdo it." Even if you feel well, it is important that you follow these activity guidelines. If you do not let your back heal properly from the surgery, you can increase the chance of a disc herniation and/or return of your symptoms. The following are instructions to help in your recovery once you have been discharged from the hospital.  * It is ok to take NSAIDs after surgery.  Activity    No bending, lifting, or twisting ("BLT"). Avoid lifting objects heavier than 10 pounds (gallon milk jug).  Where possible, avoid household activities that involve lifting, bending, pushing, or pulling such as laundry, vacuuming, grocery shopping, and childcare. Try to arrange for help from friends and family for these activities while your back heals.  Increase physical activity slowly as tolerated.  Taking short walks is encouraged, but avoid strenuous exercise. Do not jog, run, bicycle, lift weights, or participate in any other exercises unless specifically allowed by your doctor. Avoid prolonged sitting, including car rides.  Talk to your doctor before resuming sexual activity.  You should not drive until cleared by your doctor.  Until released by your doctor, you should not return to work or school.  You should rest at home and let your body heal.   You may shower three days after your surgery.  After showering, lightly dab your incision dry. Do not take a tub bath or go swimming for 3 weeks, or until approved by your doctor at your follow-up appointment.  If you smoke, we strongly recommend that you quit.  Smoking has been proven to interfere with normal healing in your back and will dramatically reduce the success rate of your surgery. Please contact [facility] (800-QUIT-NOW) and use the resources at www.[facility].com for  assistance in stopping smoking.  Surgical Incision   If you have a dressing on your incision, you may remove it three days after your surgery. Keep your incision area clean and dry.  If you have staples or stitches on your incision, you should have a follow up scheduled for removal. If you do not have staples or stitches, you will have steri-strips (small pieces of surgical tape) or Dermabond glue. The steri-strips/glue should begin to peel away within about a week (it is fine if the steri-strips fall off before then). If the strips are still in place one week after your surgery, you may gently remove them.  Diet            You may return to your usual diet. Be sure to stay hydrated.  When to Contact us  Although your surgery and recovery will likely be uneventful, you may have some residual numbness, aches, and pains in your back and/or legs. This is normal and should improve in the next few weeks.  However, should you experience any of the following, contact us immediately: New numbness or weakness Pain that is progressively getting worse, and is not relieved by your pain medications or rest Bleeding, redness, swelling, pain, or drainage from surgical incision Chills or flu-like symptoms Fever greater than 101.0 F (38.3 C) Problems with bowel or bladder functions Difficulty breathing or shortness of breath Warmth, tenderness, or swelling in your calf  Contact Information How to contact us:  If you have any questions/concerns before or after surgery, you can reach [place] at [phone], or you can  send a [employer]. We can be reached by phone or mychart 8am-4pm, Monday-Friday.  *Please note: Calls after 4pm are forwarded to a third party answering service. Mychart messages are not routinely monitored during evenings, weekends, and holidays. Please call our office to contact the answering service for urgent concerns during non-business hours.

## 2023-01-25 NOTE — Anesthesia Procedure Notes (Signed)
Procedure Name: Intubation Date/Time: 01/25/2023 9:18 AM  Performed by: Chelsea Aus, CRNAPre-anesthesia Checklist: Patient identified, Emergency Drugs available, Suction available and Patient being monitored Patient Re-evaluated:Patient Re-evaluated prior to induction Oxygen Delivery Method: Circle system utilized Preoxygenation: Pre-oxygenation with 100% oxygen Induction Type: IV induction Ventilation: Mask ventilation without difficulty and Oral airway inserted - appropriate to patient size Laryngoscope Size: McGrath and 3 Tube type: Oral Tube size: 6.5 mm Number of attempts: 1 Airway Equipment and Method: Stylet Placement Confirmation: ETT inserted through vocal cords under direct vision, positive ETCO2 and breath sounds checked- equal and bilateral Secured at: 20 cm Tube secured with: Tape Dental Injury: Teeth and Oropharynx as per pre-operative assessment  Comments: Teresa Johns, 1. 90 mm OA for mask ventilation

## 2023-01-25 NOTE — Plan of Care (Signed)
  Problem: Coping: Goal: Level of anxiety will decrease 01/25/2023 1652 by Wilfred Lacy, RN Outcome: Progressing 01/25/2023 1502 by Wilfred Lacy, RN Outcome: Progressing   Problem: Pain Management: Goal: General experience of comfort will improve 01/25/2023 1652 by Wilfred Lacy, RN Outcome: Progressing 01/25/2023 1502 by Wilfred Lacy, RN Outcome: Progressing   Problem: Safety: Goal: Ability to remain free from injury will improve Outcome: Progressing

## 2023-01-25 NOTE — Anesthesia Preprocedure Evaluation (Signed)
Anesthesia Evaluation  Patient identified by MRN, date of birth, ID band Patient awake    Reviewed: Allergy & Precautions, NPO status , Patient's Chart, lab work & pertinent test results  History of Anesthesia Complications Negative for: history of anesthetic complications  Airway Mallampati: II  TM Distance: >3 FB Neck ROM: Full    Dental  (+) Chipped   Pulmonary neg pulmonary ROS, neg sleep apnea, neg COPD, Patient abstained from smoking.Not current smoker   Pulmonary exam normal breath sounds clear to auscultation       Cardiovascular Exercise Tolerance: Good METShypertension, + CAD  (-) Past MI (-) dysrhythmias  Rhythm:Regular Rate:Normal - Systolic murmurs    Neuro/Psych negative neurological ROS  negative psych ROS   GI/Hepatic hiatal hernia,GERD  Medicated,,(+)     (-) substance abuse    Endo/Other  neg diabetes    Renal/GU negative Renal ROS     Musculoskeletal   Abdominal   Peds  Hematology   Anesthesia Other Findings Past Medical History: No date: Adrenal adenoma, left     Comment:  benign No date: Anemia No date: Colitis No date: Complication of anesthesia     Comment:  bp dropped during EGD/during hand surgery bp became               elevated No date: GERD (gastroesophageal reflux disease) No date: Helicobacter pylori gastritis No date: Hiatal hernia No date: History of chicken pox No date: History of Helicobacter pylori infection No date: History of rectal bleeding No date: Hypertension No date: Low vitamin D level No date: Neuropathy No date: Osteoporosis No date: Presbyesophagus No date: Renal insufficiency No date: Seasonal allergies No date: Spinal stenosis of lumbar region No date: Tinnitus  Reproductive/Obstetrics                             Anesthesia Physical Anesthesia Plan  ASA: 2  Anesthesia Plan: General   Post-op Pain Management: Ofirmev IV  (intra-op)*   Induction: Intravenous  PONV Risk Score and Plan: 3 and Ondansetron and Dexamethasone  Airway Management Planned: Oral ETT  Additional Equipment: None  Intra-op Plan:   Post-operative Plan: Extubation in OR  Informed Consent: I have reviewed the patients History and Physical, chart, labs and discussed the procedure including the risks, benefits and alternatives for the proposed anesthesia with the patient or authorized representative who has indicated his/her understanding and acceptance.     Dental advisory given  Plan Discussed with: CRNA and Surgeon  Anesthesia Plan Comments: (Discussed risks of anesthesia with patient, including PONV, sore throat, lip/dental/eye damage. Rare risks discussed as well, such as cardiorespiratory and neurological sequelae, and allergic reactions. Discussed the role of CRNA in patient's perioperative care. Patient understands.)       Anesthesia Quick Evaluation

## 2023-01-25 NOTE — Plan of Care (Signed)
  Problem: Coping: Goal: Level of anxiety will decrease Outcome: Progressing   Problem: Pain Management: Goal: General experience of comfort will improve Outcome: Progressing   Problem: Safety: Goal: Ability to remain free from injury will improve Outcome: Progressing

## 2023-01-25 NOTE — Interval H&P Note (Signed)
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  The various methods of treatment have been discussed with the patient and family. After consideration of risks, benefits and other options for treatment, the patient has consented to  Procedure(s): L2-S1 DECOMPRESSION (N/A) as a surgical intervention.  The patient's history has been reviewed, patient examined, no change in status, stable for surgery.  I have reviewed the patient's chart and labs.  Questions were answered to the patient's satisfaction.     Rockie Schnoor

## 2023-01-25 NOTE — Transfer of Care (Signed)
Immediate Anesthesia Transfer of Care Note  Patient: Teresa Johns  Procedure(s) Performed: L2-S1 DECOMPRESSION (Spine Lumbar)  Patient Location: PACU  Anesthesia Type:General  Level of Consciousness: drowsy and patient cooperative  Airway & Oxygen Therapy: Patient Spontanous Breathing and Patient connected to nasal cannula oxygen  Post-op Assessment: Report given to RN and Post -op Vital signs reviewed and stable  Post vital signs: Reviewed and stable  Last Vitals:  Vitals Value Taken Time  BP 119/45 01/25/23 1215  Temp    Pulse 77 01/25/23 1217  Resp 10 01/25/23 1217  SpO2 100 % 01/25/23 1217  Vitals shown include unfiled device data.  Last Pain:  Vitals:   01/25/23 0801  TempSrc: Temporal         Complications: No notable events documented.

## 2023-01-25 NOTE — Anesthesia Postprocedure Evaluation (Signed)
Anesthesia Post Note  Patient: Byron H Burleson  Procedure(s) Performed: L2-S1 DECOMPRESSION (Spine Lumbar)  Patient location during evaluation: PACU Anesthesia Type: General Level of consciousness: awake and alert Pain management: pain level controlled Vital Signs Assessment: post-procedure vital signs reviewed and stable Respiratory status: spontaneous breathing, nonlabored ventilation, respiratory function stable and patient connected to nasal cannula oxygen Cardiovascular status: blood pressure returned to baseline and stable Postop Assessment: no apparent nausea or vomiting Anesthetic complications: no   No notable events documented.   Last Vitals:  Vitals:   01/25/23 1230 01/25/23 1245  BP: 108/61 108/67  Pulse: (!) 107 78  Resp: 18 16  Temp:    SpO2: 95% 98%    Last Pain:  Vitals:   01/25/23 1245  TempSrc:   PainSc: 3                  Corinda Gubler

## 2023-01-25 NOTE — Op Note (Signed)
Indications: [name] is suffering from lumbar stenosis causing neurogenic claudication (ICD10 M48.062). The patient tried and failed conservative management, prompting surgical intervention.  Findings: severe stenosis  Preoperative Diagnosis: Lumbar Stenosis with neurogenic claudication Postoperative Diagnosis: same   EBL: 50 ml IVF:see anesthesia record Drains: one Disposition: Extubated and Stable to PACU Complications: none  No foley catheter was placed.   Preoperative Note:  Risks of surgery discussed include: infection, bleeding, stroke, coma, death, paralysis, CSF leak, nerve/spinal cord injury, numbness, tingling, weakness, complex regional pain syndrome, recurrent stenosis and/or disc herniation, vascular injury, development of instability, neck/back pain, need for further surgery, persistent symptoms, development of deformity, and the risks of anesthesia. The patient understood these risks and agreed to proceed.  Operative Note:   1. L2-S1 lumbar decompression including central laminectomy and bilateral medial facetectomies including foraminotomies  The patient was then brought from the preoperative center with intravenous access established.  The patient underwent general anesthesia and endotracheal tube intubation, and was then rotated on the [place] rail top where all pressure points were appropriately padded.  The skin was then thoroughly cleansed.  Perioperative antibiotic prophylaxis was administered.  Sterile prep and drapes were then applied and a timeout was then observed.  C-arm was brought into the field under sterile conditions and under lateral visualization the L5-S1 and L2/3 interspaces were identified and marked.  Please note that there are differing To interventions used in this patient's chart.  We utilized the counting convention on her MRI scan.  The incision was marked on the right and injected with local anesthetic. Once this was complete a 8 cm  incision was opened with the use of a #10 blade knife.    The metrx tubes were sequentially advanced and confirmed in position at L5-S1. An 18mm by 50mm tube was locked in place to the bed side attachment.  The microscope was then sterilely brought into the field and muscle creep was hemostased with a bipolar and resected with a pituitary rongeur.  A Bovie extender was then used to expose the spinous process and lamina.  Careful attention was placed to not violate the facet capsule. A 3 mm matchstick drill bit was then used to make a hemi-laminotomy trough until the ligamentum flavum was exposed.  This was extended to the base of the spinous process and to the contralateral side to remove all the central bone from each side.  Once this was complete and the underlying ligamentum flavum was visualized, it was dissected with a curette and resected with Kerrison rongeurs.  Extensive ligamentum hypertrophy was noted, requiring a substantial amount of time and care for removal.  The dura was identified and palpated. The kerrison rongeur was then used to remove the medial facet bilaterally until no compression was noted.  A balltip probe was used to confirm decompression of the ipsilateral S1 nerve root.  No CSF leak was noted.  After performing the decompression at L5-S1, the metrx tubes were sequentially advanced and confirmed in position at L4-5. An 18mm by 40mm tube was locked in place to the bed side attachment.  Fluoroscopy was then removed from the field.  The microscope was then sterilely brought into the field and muscle creep was hemostased with a bipolar and resected with a pituitary rongeur.  A Bovie extender was then used to expose the spinous process and lamina.  Careful attention was placed to not violate the facet capsule. A 3 mm matchstick drill bit was then used to make a hemi-laminotomy  trough until the ligamentum flavum was exposed.  This was extended to the base of the spinous process and to the  contralateral side to remove all the central bone from each side.  Once this was complete and the underlying ligamentum flavum was visualized, it was dissected with a curette and resected with Kerrison rongeurs.  Extensive ligamentum hypertrophy was noted, requiring a substantial amount of time and care for removal.  The dura was identified and palpated. The kerrison rongeur was then used to remove the medial facet bilaterally until no compression was noted.  A balltip probe was used to confirm decompression of the ipsilateral L5 nerve root.  Additional attention was paid to completion of the contralateral foraminotomy until the contralateral L5 nerve root was completely free.  Once this was complete, L4-5 central decompression including medial facetectomy and foraminotomy was confirmed and decompression on both sides was confirmed. No CSF leak was noted.  Depo-Medrol was placed on the nerve root.   The wound was copiously irrigated. The tube system was then removed under microscopic visualization and hemostasis was obtained with a bipolar.    After performing the decompression at L4-5, the metrx tubes were sequentially advanced and confirmed in position at L3-4. An 18mm by 40mm tube was locked in place to the bed side attachment.  Fluoroscopy was then removed from the field.  The microscope was then sterilely brought into the field and muscle creep was hemostased with a bipolar and resected with a pituitary rongeur.  A Bovie extender was then used to expose the spinous process and lamina.  Careful attention was placed to not violate the facet capsule. A 3 mm matchstick drill bit was then used to make a hemi-laminotomy trough until the ligamentum flavum was exposed.  This was extended to the base of the spinous process and to the contralateral side to remove all the central bone from each side.  Once this was complete and the underlying ligamentum flavum was visualized, it was dissected with a curette and  resected with Kerrison rongeurs.  Extensive ligamentum hypertrophy was noted, requiring a substantial amount of time and care for removal.  The dura was identified and palpated. The kerrison rongeur was then used to remove the medial facet bilaterally until no compression was noted.  A balltip probe was used to confirm decompression of the ipsilateral L4 nerve root.  Additional attention was paid to completion of the contralateral foraminotomy until the contralateral L4 nerve root was completely free.  Once this was complete, L3-4 central decompression including medial facetectomy and foraminotomy was confirmed and decompression on both sides was confirmed. No CSF leak was noted.  Depo-Medrol was placed on the nerve root.   The wound was copiously irrigated. The tube system was then removed under microscopic visualization and hemostasis was obtained with a bipolar.     After performing the decompression at L3-4, the metrx tubes were sequentially advanced and confirmed in position at L2-3. An 18mm by 40mm tube was locked in place to the bed side attachment.  Fluoroscopy was then removed from the field.  The microscope was then sterilely brought into the field and muscle creep was hemostased with a bipolar and resected with a pituitary rongeur.  A Bovie extender was then used to expose the spinous process and lamina.  Careful attention was placed to not violate the facet capsule. A 3 mm matchstick drill bit was then used to make a hemi-laminotomy trough until the ligamentum flavum was exposed.  This was extended  to the base of the spinous process and to the contralateral side to remove all the central bone from each side.  Once this was complete and the underlying ligamentum flavum was visualized, it was dissected with a curette and resected with Kerrison rongeurs.  Extensive ligamentum hypertrophy was noted, requiring a substantial amount of time and care for removal.  The dura was identified and palpated. The  kerrison rongeur was then used to remove the medial facet bilaterally until no compression was noted.  A balltip probe was used to confirm decompression of the ipsilateral L3 nerve root.  Additional attention was paid to completion of the contralateral foraminotomy until the contralateral L3 nerve root was completely free.  Once this was complete, L2-3 central decompression including medial facetectomy and foraminotomy was confirmed and decompression on both sides was confirmed. No CSF leak was noted.  Depo-Medrol was placed on the nerve root.   The wound was copiously irrigated. The tube system was then removed under microscopic visualization and hemostasis was obtained with a bipolar.     Depo-Medrol was placed on the nerve root.  The wound was copiously irrigated. The tube system was then removed under microscopic visualization and hemostasis was obtained with a bipolar.    A drain was placed.  The fascial layer was reapproximated with the use of a 0 Vicryl suture.  Subcutaneous tissue layer was reapproximated using 2-0 Vicryl suture.  3-0 monocryl was placed in subcuticular fashion. The skin was then cleansed and Dermabond was used to close the skin opening.  Patient was then rotated back to the preoperative bed awakened from anesthesia and taken to recovery all counts are correct in this case.  I performed the entire procedure with the assistance of Manning Charity PA as an Designer, television/film set. An assistant was required for this procedure due to the complexity.  The assistant provided assistance in tissue manipulation and suction, and was required for the successful and safe performance of the procedure. I performed the critical portions of the procedure.   Herny Scurlock K. Myer Haff MD

## 2023-01-26 ENCOUNTER — Telehealth: Payer: Self-pay | Admitting: Family Medicine

## 2023-01-26 ENCOUNTER — Encounter: Payer: Self-pay | Admitting: Neurosurgery

## 2023-01-26 DIAGNOSIS — M48062 Spinal stenosis, lumbar region with neurogenic claudication: Secondary | ICD-10-CM | POA: Diagnosis not present

## 2023-01-26 MED ORDER — OXYCODONE HCL 5 MG PO TABS: 5.0000 mg | ORAL_TABLET | ORAL | 0 refills | Status: DC | PRN

## 2023-01-26 MED ORDER — LOSARTAN POTASSIUM 50 MG PO TABS
ORAL_TABLET | ORAL | Status: AC
  Filled 2023-01-26: qty 1

## 2023-01-26 MED ORDER — SENNA 8.6 MG PO TABS: 1.0000 | ORAL_TABLET | Freq: Two times a day (BID) | ORAL | 0 refills | Status: AC | PRN

## 2023-01-26 MED ORDER — GABAPENTIN 300 MG PO CAPS
ORAL_CAPSULE | ORAL | Status: AC
  Filled 2023-01-26: qty 1

## 2023-01-26 MED ORDER — ACETAMINOPHEN 500 MG PO TABS
ORAL_TABLET | ORAL | Status: AC
  Filled 2023-01-26: qty 2

## 2023-01-26 MED ORDER — ONDANSETRON HCL 4 MG/2ML IJ SOLN
INTRAMUSCULAR | Status: AC
  Filled 2023-01-26: qty 2

## 2023-01-26 MED ORDER — PANTOPRAZOLE SODIUM 40 MG PO TBEC
DELAYED_RELEASE_TABLET | ORAL | Status: AC
  Filled 2023-01-26: qty 1

## 2023-01-26 MED ORDER — TRIAMTERENE-HCTZ 37.5-25 MG PO TABS
ORAL_TABLET | ORAL | Status: AC
  Filled 2023-01-26: qty 1

## 2023-01-26 MED ORDER — SENNA 8.6 MG PO TABS
ORAL_TABLET | ORAL | Status: AC
  Filled 2023-01-26: qty 1

## 2023-01-26 MED ORDER — AMLODIPINE BESYLATE 5 MG PO TABS
ORAL_TABLET | ORAL | Status: AC
  Filled 2023-01-26: qty 2

## 2023-01-26 MED ORDER — DOCUSATE SODIUM 100 MG PO CAPS
ORAL_CAPSULE | ORAL | Status: AC
  Filled 2023-01-26: qty 1

## 2023-01-26 MED ORDER — METHOCARBAMOL 500 MG PO TABS: 500.0000 mg | ORAL_TABLET | Freq: Four times a day (QID) | ORAL | 0 refills | Status: DC | PRN

## 2023-01-26 MED ORDER — ENOXAPARIN SODIUM 40 MG/0.4ML IJ SOSY
PREFILLED_SYRINGE | INTRAMUSCULAR | Status: AC
  Filled 2023-01-26: qty 0.4

## 2023-01-26 NOTE — Telephone Encounter (Signed)
PA requested from Cover My meds submitted for Methocarbamol 500mg .  PA approved PA Case ID #: [id number]  Ex: 01/26/2024

## 2023-01-26 NOTE — Plan of Care (Signed)
  Problem: Clinical Measurements: Goal: Respiratory complications will improve Outcome: Progressing   Problem: Activity: Goal: Risk for activity intolerance will decrease Outcome: Progressing   Problem: Coping: Goal: Level of anxiety will decrease Outcome: Progressing   Problem: Pain Management: Goal: General experience of comfort will improve Outcome: Progressing   Problem: Education: Goal: Ability to verbalize activity precautions or restrictions will improve Outcome: Progressing   Problem: Activity: Goal: Ability to avoid complications of mobility impairment will improve Outcome: Progressing Goal: Will remain free from falls Outcome: Progressing   Problem: Pain Management: Goal: Pain level will decrease Outcome: Progressing

## 2023-01-26 NOTE — Progress Notes (Signed)
   Neurosurgery Progress Note  History: Teresa Johns is s/p L2-S1 decompression  POD1: Significant improvement of preoperative leg pain.  Patient describes some mild back discomfort around her incision that is manageable with current medication regimen.  She did well with physical therapy this morning.  Physical Exam: Vitals:   01/26/23 0617 01/26/23 0731  BP: (!) 146/76 (!) 140/73  Pulse:  78  Resp:  16  Temp:  (!) 97.2 F (36.2 C)  SpO2:  95%    AA Ox3 CNI  Strength:5/5 throughout BLE HV 50 since surgery  Data:  Other tests/results: NA  Assessment/Plan:  Vicci H Sherer is an 88 y.o presenting with symptomatic lumbar stenosis s/p L2-S1 decompression.  - mobilize - pain control - DVT prophylaxis - PTOT  Manning Charity PA-C Department of Neurosurgery  '

## 2023-01-26 NOTE — Evaluation (Addendum)
Physical Therapy Evaluation Patient Details Name: Teresa Johns MRN: [id number] DOB: 1934-04-01 Today's Date: 01/26/2023  History of Present Illness  Pt is an 88 yo F s/p L2-S1 decompression surgery including laminectomy, facetectomies, and foraminotomies (01/25/23) due to stenosis/neurogenic claudication. PMH includes HTN, CAD, hiatal hernia, and GERD.  Clinical Impression  Pt was pleasant and motivated to participate during the session and put forth good effort throughout. Pt was received in bed, A,O x4 and able to give detailed history. Pt was able to complete bed mobility with Mod I for slightly increased time. Pt completed transfers with CGA for safety and line management, exhibiting capable UE and LE strength. Pt ambulated with Superv and RW exhibiting proficient gait pattern. Pt was able to complete stair training with CGA for safety and cuing for efficient sequencing. Pt was educated on spinal body mechanics and exhibited ability to maintain throughout mobility. Pt's vitals were monitored throughout session and remained WNL throughout, pt reporting no adverse symptoms. Pt will benefit from continued PT services upon discharge to safely address deficits listed in patient problem list for decreased caregiver assistance and eventual return to PLOF.        If plan is discharge home, recommend the following: Assist for transportation;Assistance with cooking/housework;Help with stairs or ramp for entrance   Can travel by private vehicle        Equipment Recommendations    Recommendations for Other Services       Functional Status Assessment Patient has had a recent decline in their functional status and demonstrates the ability to make significant improvements in function in a reasonable and predictable amount of time.     Precautions / Restrictions Precautions Precautions: Fall;Back Precaution Booklet Issued: Yes (comment) Restrictions Weight Bearing Restrictions: No       Mobility  Bed Mobility Overal bed mobility: Modified Independent Bed Mobility: Supine to Sit     Supine to sit: Modified independent (Device/Increase time), HOB elevated     General bed mobility comments: extra time, no real observed difficulty    Transfers Overall transfer level: Needs assistance Equipment used: Rolling walker (2 wheels) Transfers: Sit to/from Stand Sit to Stand: Contact guard assist           General transfer comment: smooth ascent, no unsteadiness observed, good BUE push off from surface without cuing    Ambulation/Gait Ambulation/Gait assistance: Supervision Gait Distance (Feet): 200 Feet Assistive device: Rolling walker (2 wheels) Gait Pattern/deviations: Step-through pattern, Decreased step length - right, Decreased step length - left, Decreased stride length Gait velocity: decreased     General Gait Details: overall smooth pattern, very minimal BUE reliance on RW for steadying, observable R genu varum not limiting ability, pt reporting purposeful slower speed as she does at home, cuing for upright posture and body placement within RW  Stairs Stairs: Yes Stairs assistance: Contact guard assist Stair Management: One rail Right, Step to pattern Number of Stairs: 4 General stair comments: led ascent with (stronger) LLE angled to rail for BUE involvement, capable strength noted in BLE for method of choice, pt able to ascend/descend confidently with no apparent difficulties reported or observed, cuing for sequencing and RW management at base/top of stairs  Wheelchair Mobility     Tilt Bed    Modified Rankin (Stroke Patients Only)       Balance Overall balance assessment: Needs assistance (Sitting balance observed with BLEs supported and no UEs supported: Normal      Standing balance observed with no unsteadiness in static  or dynamic standing with RW use)                                           Pertinent Vitals/Pain  Pain Assessment Pain Assessment: No/denies pain    Home Living Family/patient expects to be discharged to:: Private residence Living Arrangements: Alone Available Help at Discharge: Family;Available 24 hours/day (sister - 24hrs, children - intermittent) Type of Home: House Home Access: Stairs to enter Entrance Stairs-Rails: Right Entrance Stairs-Number of Steps: 2   Home Layout: One level Home Equipment: Agricultural consultant (2 wheels);Rollator (4 wheels);Grab bars - tub/shower;Cane - single point;Shower seat      Prior Function Prior Level of Function : Needs assist;Independent/Modified Independent             Mobility Comments: pt reports mod I community ambulator with occasional use of SPC; no falls in last 6 mo ADLs Comments: pt reports ind with home ADLs, receiving assist from family for errands due to not currently driving     Extremity/Trunk Assessment   Upper Extremity Assessment Upper Extremity Assessment: Overall WFL for tasks assessed    Lower Extremity Assessment Lower Extremity Assessment: Overall WFL for tasks assessed (reported weakness/pain of R knee)       Communication   Communication Communication: No apparent difficulties Cueing Techniques: Verbal cues;Visual cues  Cognition Arousal: Alert Behavior During Therapy: WFL for tasks assessed/performed Overall Cognitive Status: Within Functional Limits for tasks assessed                                          General Comments      Exercises General Exercises - Lower Extremity Hip Flexion/Marching: AROM, Both, 10 reps, Seated Other Exercises Other Exercises: Pt educated to avoid quick twisting/bending/lifting movements given surgery, in order to decrease pain and improve healing   Assessment/Plan    PT Assessment Patient needs continued PT services  PT Problem List Decreased strength;Decreased range of motion;Decreased activity tolerance;Decreased balance;Decreased knowledge of use  of DME;Decreased coordination;Decreased mobility;Decreased safety awareness;Pain       PT Treatment Interventions DME instruction;Therapeutic exercise;Gait training;Balance training;Stair training;Functional mobility training;Therapeutic activities;Neuromuscular re-education;Patient/family education    PT Goals (Current goals can be found in the Care Plan section)  Acute Rehab PT Goals Patient Stated Goal: get home PT Goal Formulation: With patient Time For Goal Achievement: 02/08/23 Potential to Achieve Goals: Good    Frequency Min 1X/week     Co-evaluation               AM-PAC PT "6 Clicks" Mobility  Outcome Measure Help needed turning from your back to your side while in a flat bed without using bedrails?: None Help needed moving from lying on your back to sitting on the side of a flat bed without using bedrails?: None Help needed moving to and from a bed to a chair (including a wheelchair)?: None Help needed standing up from a chair using your arms (e.g., wheelchair or bedside chair)?: None Help needed to walk in hospital room?: None Help needed climbing 3-5 steps with a railing? : A Little 6 Click Score: 23    End of Session Equipment Utilized During Treatment: Gait belt Activity Tolerance: Patient tolerated treatment well;No increased pain Patient left: in chair;with call bell/phone within reach;with family/visitor present;with SCD's  reapplied Nurse Communication: Mobility status PT Visit Diagnosis: Muscle weakness (generalized) (M62.81);Pain Pain - Right/Left:  (bilat) Pain - part of body:  (low back)    Time: 4403-4742 PT Time Calculation (min) (ACUTE ONLY): 34 min   Charges:               Rosiland Oz SPT 01/26/23, 10:47 AM This entire session was performed under direct supervision and direction of a licensed therapist/therapist assistant. I have personally read, edited and approve of the note as written.  Loran Senters, DPT

## 2023-01-26 NOTE — Evaluation (Signed)
Occupational Therapy Evaluation Patient Details Name: Teresa Johns MRN: [id number] DOB: [birth year] Today's Date: 01/26/2023   History of Present Illness Pt is an 88 yo F s/p L2-S1 decompression surgery including laminectomy, facetectomies, and foraminotomies (01/25/23) due to stenosis/neurogenic claudication. PMH includes HTN, CAD, hiatal hernia, and GERD.   Clinical Impression   Pt seen for OT evaluation this date, POD#1. Prior to hospital admission, pt was independent with mobility, ADL, and IADL and living by herself. Her son will be available to assist at discharge. Pt required SUPV for ADL transfers with RW, ambulates with CGA + RW, completes toileting with SBA, and requires MIN A for LB ADL tasks in order to maintain proper back body mechanics. Pt/son educated in back precautions, self care skills, bed mobility and functional transfer training, AE/DME for bathing, dressing, and toileting needs, and home/routines modifications and falls prevention strategies to maximize safety and functional independence while minimizing falls risk and maintaining precautions. Pt/son verbalized understanding of all education/training provided. Handout provided to support recall and carry over of learned precautions/techniques for bed mobility, functional transfers, and self care skills. No additional skilled OT needs at this time. Will discharge in house. Upon hospital discharge, pt safe to discharge home.     If plan is discharge home, recommend the following: A little help with bathing/dressing/bathroom;Assistance with cooking/housework;Assist for transportation;Help with stairs or ramp for entrance    Functional Status Assessment  Patient has had a recent decline in their functional status and demonstrates the ability to make significant improvements in function in a reasonable and predictable amount of time.  Equipment Recommendations  None recommended by OT    Recommendations for Other Services        Precautions / Restrictions Precautions Precautions: Fall;Back Precaution Booklet Issued: Yes (comment) Restrictions Weight Bearing Restrictions: No      Mobility Bed Mobility               General bed mobility comments: NT in recliner at start and end of OT session    Transfers Overall transfer level: Needs assistance Equipment used: Rolling walker (2 wheels) Transfers: Sit to/from Stand Sit to Stand: Supervision                  Balance Overall balance assessment: Needs assistance Sitting-balance support: No upper extremity supported, Feet supported Sitting balance-Leahy Scale: Normal     Standing balance support: No upper extremity supported, During functional activity Standing balance-Leahy Scale: Good Standing balance comment: tolerated standing at sink without UE support for grooming tasks wihtout difficulty                           ADL either performed or assessed with clinical judgement   ADL Overall ADL's : Needs assistance/impaired                                       General ADL Comments: Pt requires MIN A for LBA DL tasks in order to maintain back precautions. Son able to assist at discharge.     Vision         Perception         Praxis         Pertinent Vitals/Pain Pain Assessment Pain Assessment: 0-10 Pain Score: 2  Pain Location: back Pain Descriptors / Indicators: Aching Pain Intervention(s): Monitored during session, Repositioned  Extremity/Trunk Assessment Upper Extremity Assessment Upper Extremity Assessment: Overall WFL for tasks assessed   Lower Extremity Assessment Lower Extremity Assessment: Overall WFL for tasks assessed (reported weakness/pain of R knee)       Communication Communication Communication: No apparent difficulties Cueing Techniques: Verbal cues;Visual cues   Cognition Arousal: Alert Behavior During Therapy: WFL for tasks assessed/performed Overall  Cognitive Status: Within Functional Limits for tasks assessed                                       General Comments       Exercises Other Exercises Other Exercises: Pt/son instructed in back body mechanics and how to maintain during ADL and mobility, falls prevention, AE/DME, and home/routines modifications. Handout  provided.   Shoulder Instructions      Home Living Family/patient expects to be discharged to:: Private residence Living Arrangements: Alone Available Help at Discharge: Family;Available 24 hours/day (sister - 24hrs, children - intermittent) Type of Home: House Home Access: Stairs to enter [employer] of Steps: 2 Entrance Stairs-Rails: Right Home Layout: One level     Bathroom Shower/Tub: Engineer, production Accessibility: Yes   Home Equipment: Agricultural consultant (2 wheels);Rollator (4 wheels);Grab bars - tub/shower;Cane - single point;Shower seat          Prior Functioning/Environment Prior Level of Function : Needs assist;Independent/Modified Independent             Mobility Comments: pt reports mod I community ambulator with occasional use of SPC; no falls in last 6 mo ADLs Comments: pt reports ind with home ADLs, receiving assist from family for errands due to not currently driving        OT Problem List: Impaired balance (sitting and/or standing);Decreased knowledge of use of DME or AE;Decreased knowledge of precautions      OT Treatment/Interventions:      OT Goals(Current goals can be found in the care plan section) Acute Rehab OT Goals Patient Stated Goal: go home OT Goal Formulation: All assessment and education complete, DC therapy  OT Frequency:      Co-evaluation              AM-PAC OT "6 Clicks" Daily Activity     Outcome Measure Help from another person eating meals?: None Help from another person taking care of personal grooming?: None Help from another person toileting, which includes  using toliet, bedpan, or urinal?: None Help from another person bathing (including washing, rinsing, drying)?: A Little Help from another person to put on and taking off regular upper body clothing?: None Help from another person to put on and taking off regular lower body clothing?: A Little 6 Click Score: 22   End of Session Equipment Utilized During Treatment: Rolling walker (2 wheels)  Activity Tolerance: Patient tolerated treatment well Patient left: in chair;with call bell/phone within reach;with family/visitor present  OT Visit Diagnosis: Other abnormalities of gait and mobility (R26.89)                Time: 1914-7829 OT Time Calculation (min): 14 min Charges:  OT General Charges $OT Visit: 1 Visit OT Treatments $Self Care/Home Management : 8-22 mins  Arman Filter., MPH, MS, OTR/L ascom [phone] 01/26/23, 9:44 AM

## 2023-01-26 NOTE — Discharge Summary (Signed)
Discharge Summary  Patient ID: Teresa Johns MRN: [id number] DOB/AGE: Jan 26, 1935 88 y.o.  Admit date: 01/25/2023 Discharge date: 01/26/2023  Admission Diagnoses: Lumbar Stenosis with neurogenic claudication  Discharge Diagnoses:  Principal Problem:   Lumbar stenosis Active Problems:   Neurogenic claudication due to lumbar spinal stenosis   Discharged Condition: good  Hospital Course:  Teresa Johns is a 88 y.o presenting with symptomatic lumbar stenosis status post L2-S1 lumbar decompression.  Her intraoperative course was uncomplicated.  She was admitted overnight for drain output monitoring, therapy evaluation, and pain control.  She was seen and evaluated by therapy and deemed appropriate for discharge home on postop day 1.  Her drain output was amenable and was removed on the morning of postop day 1.  She was sent home with prescriptions for oxycodone, Robaxin, and senna to take as needed.  Consults: None  Significant Diagnostic Studies: none  Treatments: surgery: As above.  Please see separately dictated operative report for further details.  Discharge Exam: Blood pressure (!) 140/73, pulse 78, temperature (!) 97.2 F (36.2 C), resp. rate 16, height 5\' 3"  (1.6 m), weight 69.4 kg, SpO2 95%. AA Ox3 CNI   Strength:5/5 throughout BLE  Disposition: Discharge disposition: 01-Home or Self Care       Discharge Instructions     Incentive spirometry RT   Complete by: As directed       Allergies as of 01/26/2023   No Known Allergies      Medication List     STOP taking these medications    HYDROcodone-acetaminophen 5-325 MG tablet Commonly known as: NORCO/VICODIN   VITAMIN B 12 PO       TAKE these medications    acetaminophen 500 MG tablet Commonly known as: TYLENOL Take 1,000 mg by mouth every 6 (six) hours as needed for moderate pain (pain score 4-6) or mild pain (pain score 1-3).   acetaminophen 650 MG CR tablet Commonly known as: TYLENOL Take  1,300 mg by mouth every 8 (eight) hours as needed for pain.   ALLERGY PO Take 1 tablet by mouth daily as needed (allergy). Allergy relief   amLODipine 10 MG tablet Commonly known as: NORVASC Take 10 mg by mouth every morning.   AZO TABS PO Take 1 tablet by mouth daily as needed (aggravated).   bismuth subsalicylate 262 MG chewable tablet Commonly known as: PEPTO BISMOL Chew 524 mg by mouth daily as needed for indigestion.   calcium carbonate 750 MG chewable tablet Commonly known as: TUMS EX Chew 1 tablet by mouth daily as needed for heartburn.   Cholecalciferol 25 MCG (1000 UT) tablet Take 1,000 Units by mouth daily.   diphenhydramine-acetaminophen 25-500 MG Tabs tablet Commonly known as: TYLENOL PM Take 2 tablets by mouth at bedtime as needed (sleep).   docusate sodium 100 MG capsule Commonly known as: COLACE Take 100 mg by mouth daily.   gabapentin 300 MG capsule Commonly known as: NEURONTIN Take 300 mg by mouth 3 (three) times daily.   losartan 50 MG tablet Commonly known as: COZAAR Take 50 mg by mouth every morning.   MAGNESIUM PO Take 1 tablet by mouth daily as needed (Leg cramping). Gummy   methocarbamol 500 MG tablet Commonly known as: ROBAXIN Take 1 tablet (500 mg total) by mouth every 6 (six) hours as needed for muscle spasms.   multivitamin with minerals tablet Take 1 tablet by mouth daily. With omega 3 gummy   naphazoline-glycerin 0.012-0.25 % Soln Commonly known as: CLEAR EYES REDNESS Place  1-2 drops into both eyes daily as needed for eye irritation.   oxyCODONE 5 MG immediate release tablet Commonly known as: Oxy IR/ROXICODONE Take 1 tablet (5 mg total) by mouth every 4 (four) hours as needed for moderate pain (pain score 4-6) or severe pain (pain score 7-10).   pantoprazole 20 MG tablet Commonly known as: Protonix Take 1 tablet (20 mg total) by mouth daily. What changed:  how much to take when to take this   senna 8.6 MG Tabs  tablet Commonly known as: SENOKOT Take 1 tablet (8.6 mg total) by mouth 2 (two) times daily as needed for mild constipation.   triamterene-hydrochlorothiazide 37.5-25 MG capsule Commonly known as: DYAZIDE Take 1 capsule by mouth every morning.        Follow-up Information     Drake Leach, PA-C Follow up on 02/09/2023.   Specialty: Neurosurgery Contact information: [address] [place] [address] [phone]                 Signed: Susanne Borders 01/26/2023, 9:26 AM

## 2023-01-30 ENCOUNTER — Telehealth: Payer: Self-pay

## 2023-01-30 NOTE — Telephone Encounter (Signed)
Teresa Johns called in to inquire when she can remove her compression stockings. I advised that she can remove these when she is walking a couple hundred feet three times a day. She is walking frequently with a walker around her house. Her family is checking on her often. She is having a hard time putting on and taking off the compression stockings. We discussed that she can remove them at this time as long as she is walking frequently throughout the day.  She reports new numbness in her toes that started before she went home that is now in her foot and ankle. Sometimes in her left, but mostly her right. Her leg pain has resolved. She is taking tylenol and gabapentin. She has taken methocarbamol one time. She has not needed oxycodone. We discussed that this is not unusual at this time and may be due to manipulation of the nerves and it should hopefully improve with time.  I encouraged her to contact us with any further questions/concerns.

## 2023-02-07 NOTE — Progress Notes (Unsigned)
   REFERRING PHYSICIAN:  Barbette Reichmann, Md [address] [place],  [place] [address]  DOS: 01/25/23  L2-S1 lumbar decompression  HISTORY OF PRESENT ILLNESS: Teresa Johns is approximately 2 weeks status post above surgery. Was given oxycodone and robaxin on discharge from the hospital.   She called a few days after surgery with some numbness in right toes- this has improved, but right great toe still feels numb. Her preop right leg pain is better.   She is taking tylenol and neurontin. Not taking oxycodone or robaxin.  PHYSICAL EXAMINATION:  General: Patient is well developed, well nourished, calm, collected, and in no apparent distress.   NEUROLOGICAL:  General: In no acute distress.   Awake, alert, oriented to person, place, and time.  Pupils equal round and reactive to light.  Facial tone is symmetric.     Strength:            Side Iliopsoas Quads Hamstring PF DF EHL  R 5 5 5 5 5 5   L 5 5 5 5 5 5    Incision c/d/I, staples were removed before I saw her.    ROS (Neurologic):  Negative except as noted above  IMAGING: Nothing new to review.   ASSESSMENT/PLAN:  Teresa Johns is doing well s/p above surgery. Treatment options reviewed with patient and following plan made:   - I have advised the patient to lift up to 10 pounds until 6 weeks after surgery (follow up with Dr. Myer Haff).  - Reviewed wound care.  - No bending, twisting, or lifting.  - Continue on current medications including tylenol and neurontin.  - Follow up as scheduled in 4 weeks and prn.   Advised to contact the office if any questions or concerns arise.  Drake Leach PA-C Department of neurosurgery

## 2023-02-09 ENCOUNTER — Encounter: Payer: Self-pay | Admitting: Orthopedic Surgery

## 2023-02-09 ENCOUNTER — Ambulatory Visit (INDEPENDENT_AMBULATORY_CARE_PROVIDER_SITE_OTHER): Payer: No Typology Code available for payment source | Admitting: Orthopedic Surgery

## 2023-02-09 VITALS — BP 134/74 | Ht 63.0 in | Wt 153.0 lb

## 2023-02-09 DIAGNOSIS — M48062 Spinal stenosis, lumbar region with neurogenic claudication: Secondary | ICD-10-CM

## 2023-02-09 DIAGNOSIS — Z9889 Other specified postprocedural states: Secondary | ICD-10-CM

## 2023-03-09 ENCOUNTER — Ambulatory Visit: Payer: No Typology Code available for payment source | Admitting: Neurosurgery

## 2023-03-09 ENCOUNTER — Encounter: Payer: Self-pay | Admitting: Neurosurgery

## 2023-03-09 VITALS — BP 126/72 | Ht 63.0 in | Wt 153.0 lb

## 2023-03-09 DIAGNOSIS — M48062 Spinal stenosis, lumbar region with neurogenic claudication: Secondary | ICD-10-CM

## 2023-03-09 DIAGNOSIS — Z09 Encounter for follow-up examination after completed treatment for conditions other than malignant neoplasm: Secondary | ICD-10-CM

## 2023-03-09 NOTE — Progress Notes (Signed)
   REFERRING PHYSICIAN:  Sadie Manna, Md [address] [place],  [place] [address]  DOS: 01/25/23  L2-S1 lumbar decompression HISTORY OF PRESENT ILLNESS: Teresa Johns is status post above surgery.    She is doing extremely well.  Her radicular pain is greatly improved.  She has some residual numbness.  She is now having significant pain around her right knee.   PHYSICAL EXAMINATION:  General: Patient is well developed, well nourished, calm, collected, and in no apparent distress.   NEUROLOGICAL:  General: In no acute distress.   Awake, alert, oriented to person, place, and time.  Pupils equal round and reactive to light.  Facial tone is symmetric.     Strength:            Side Iliopsoas Quads Hamstring PF DF EHL  R 5 5 5 5 5 5   L 5 5 5 5 5 5    Incision c/d/I, staples were removed before I saw her.    ROS (Neurologic):  Negative except as noted above  IMAGING: Nothing new to review.   ASSESSMENT/PLAN:  Teresa Johns is doing well s/p above surgery.   I am very pleased with her response to surgery.  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  She is previously seen Dr.Aberman at [facility] clinic for this.  I have suggested that she return to see him to discuss interventions for her knee.  We reviewed her activity limitations.  Will see her back in 6 weeks.    Reeves Daisy MD Department of neurosurgery

## 2023-03-20 ENCOUNTER — Encounter: Payer: Self-pay | Admitting: Neurosurgery

## 2023-04-07 DIAGNOSIS — M170 Bilateral primary osteoarthritis of knee: Secondary | ICD-10-CM | POA: Diagnosis not present

## 2023-04-07 DIAGNOSIS — M25562 Pain in left knee: Secondary | ICD-10-CM | POA: Diagnosis not present

## 2023-04-17 NOTE — Progress Notes (Signed)
   REFERRING PHYSICIAN:  Barbette Reichmann, Md [address] [place],  [place] [address]  DOS: 01/25/23  L2-S1 lumbar decompression  HISTORY OF PRESENT ILLNESS:  She was doing well at her last visit with some residual numbness and right knee pain.   She is doing better walking. She still has some numbness in right toes. She has intermittent LBP with prolonged standing or walking. Most of her pain is in right > left knees. She is scheduled for right TKA on 05/22/23.   She is taking tylenol and neurontin.   PHYSICAL EXAMINATION:  General: Patient is well developed, well nourished, calm, collected, and in no apparent distress.   NEUROLOGICAL:  General: In no acute distress.   Awake, alert, oriented to person, place, and time.  Pupils equal round and reactive to light.  Facial tone is symmetric.     Strength:            Side Iliopsoas Quads Hamstring PF DF EHL  R 5 5 5 5 5 5   L 5 5 5 5 5 5    Incision well healed.    ROS (Neurologic):  Negative except as noted above  IMAGING: Nothing new to review.   ASSESSMENT/PLAN:  Teresa Johns is doing well s/p above surgery. Treatment options reviewed with patient and following plan made:   - Continue with activity as tolerated. I would avoid any significant lifting.  - Numbness in right foot may continue to improve with time.  - She will follow up prn.   Advised to contact the office if any questions or concerns arise.  Drake Leach PA-C Department of neurosurgery

## 2023-04-18 ENCOUNTER — Ambulatory Visit (INDEPENDENT_AMBULATORY_CARE_PROVIDER_SITE_OTHER): Payer: No Typology Code available for payment source | Admitting: Orthopedic Surgery

## 2023-04-18 ENCOUNTER — Encounter: Payer: Self-pay | Admitting: Orthopedic Surgery

## 2023-04-18 VITALS — BP 122/64 | Temp 97.6°F | Ht 63.0 in | Wt 162.6 lb

## 2023-04-18 DIAGNOSIS — Z9889 Other specified postprocedural states: Secondary | ICD-10-CM

## 2023-04-18 DIAGNOSIS — M48062 Spinal stenosis, lumbar region with neurogenic claudication: Secondary | ICD-10-CM

## 2023-04-18 NOTE — Patient Instructions (Signed)
It was nice to see you today.   I am glad that you are feeling better!   I recommend seeing neurology at the [facility]- I would see Dr. Sherryll Burger.   You can bend and twist as tolerated. I would avoid heavy lifting.   Please call with any questions or concerns.   Drake Leach PA-C [phone]     The physicians and staff at [facility] at [facility] are committed to providing excellent care. You may receive a survey asking for feedback about your experience at our office. We value you your feedback and appreciate you taking the time to to fill it out. The [facility] leadership team is also available to discuss your experience in person, feel free to contact us [phone].

## 2023-05-08 ENCOUNTER — Other Ambulatory Visit: Payer: Self-pay | Admitting: Orthopedic Surgery

## 2023-05-10 DIAGNOSIS — M1711 Unilateral primary osteoarthritis, right knee: Secondary | ICD-10-CM | POA: Diagnosis not present

## 2023-05-12 DIAGNOSIS — M1712 Unilateral primary osteoarthritis, left knee: Secondary | ICD-10-CM | POA: Diagnosis not present

## 2023-05-16 ENCOUNTER — Encounter
Admission: RE | Admit: 2023-05-16 | Discharge: 2023-05-16 | Disposition: A | Discharge status: Home / Self Care | Source: Ambulatory Visit | Attending: Orthopedic Surgery | Admitting: Orthopedic Surgery

## 2023-05-16 ENCOUNTER — Other Ambulatory Visit: Payer: Self-pay

## 2023-05-16 DIAGNOSIS — Z01818 Encounter for other preprocedural examination: Secondary | ICD-10-CM | POA: Insufficient documentation

## 2023-05-16 DIAGNOSIS — Z01812 Encounter for preprocedural laboratory examination: Secondary | ICD-10-CM

## 2023-05-16 DIAGNOSIS — I493 Ventricular premature depolarization: Secondary | ICD-10-CM | POA: Diagnosis not present

## 2023-05-16 HISTORY — DX: Primary generalized (osteo)arthritis: M15.0

## 2023-05-16 HISTORY — DX: Other specified disorders of kidney and ureter: N28.89

## 2023-05-16 HISTORY — DX: Essential (primary) hypertension: I10

## 2023-05-16 HISTORY — DX: Chronic kidney disease, stage 4 (severe): N18.4

## 2023-05-16 LAB — URINALYSIS, ROUTINE W REFLEX MICROSCOPIC
Bilirubin Urine: NEGATIVE
Glucose, UA: NEGATIVE mg/dL
Ketones, ur: NEGATIVE mg/dL
Leukocytes,Ua: NEGATIVE
Nitrite: NEGATIVE
Protein, ur: NEGATIVE mg/dL
Specific Gravity, Urine: 1.005 (ref 1.005–1.030)
WBC, UA: 0 WBC/hpf (ref 0–5)
pH: 6 (ref 5.0–8.0)

## 2023-05-16 LAB — COMPREHENSIVE METABOLIC PANEL
ALT: 13 U/L (ref 0–44)
AST: 17 U/L (ref 15–41)
Albumin: 4 g/dL (ref 3.5–5.0)
Alkaline Phosphatase: 62 U/L (ref 38–126)
Anion gap: 11 (ref 5–15)
BUN: 33 mg/dL — ABNORMAL HIGH (ref 8–23)
CO2: 24 mmol/L (ref 22–32)
Calcium: 9.5 mg/dL (ref 8.9–10.3)
Chloride: 106 mmol/L (ref 98–111)
Creatinine, Ser: 1.53 mg/dL — ABNORMAL HIGH (ref 0.44–1.00)
GFR, Estimated: 33 mL/min — ABNORMAL LOW (ref >60)
Glucose, Bld: 98 mg/dL (ref 70–99)
Potassium: 3.5 mmol/L (ref 3.5–5.1)
Sodium: 141 mmol/L (ref 135–145)
Total Bilirubin: 0.6 mg/dL (ref 0.0–1.2)
Total Protein: 7.5 g/dL (ref 6.5–8.1)

## 2023-05-16 LAB — CBC WITH DIFFERENTIAL/PLATELET
Abs Immature Granulocytes: 0.04 10*3/uL (ref 0.00–0.07)
Basophils Absolute: 0.1 10*3/uL (ref 0.0–0.1)
Basophils Relative: 1 %
Eosinophils Absolute: 0.6 10*3/uL — ABNORMAL HIGH (ref 0.0–0.5)
Eosinophils Relative: 7 %
HCT: 38.5 % (ref 36.0–46.0)
Hemoglobin: 12.9 g/dL (ref 12.0–15.0)
Immature Granulocytes: 1 %
Lymphocytes Relative: 16 %
Lymphs Abs: 1.4 10*3/uL (ref 0.7–4.0)
MCH: 30.9 pg (ref 26.0–34.0)
MCHC: 33.5 g/dL (ref 30.0–36.0)
MCV: 92.3 fL (ref 80.0–100.0)
Monocytes Absolute: 0.9 10*3/uL (ref 0.1–1.0)
Monocytes Relative: 11 %
Neutro Abs: 5.5 10*3/uL (ref 1.7–7.7)
Neutrophils Relative %: 64 %
Platelets: 292 10*3/uL (ref 150–400)
RBC: 4.17 MIL/uL (ref 3.87–5.11)
RDW: 13.3 % (ref 11.5–15.5)
WBC: 8.5 10*3/uL (ref 4.0–10.5)
nRBC: 0 % (ref 0.0–0.2)

## 2023-05-16 LAB — SURGICAL PCR SCREEN
MRSA, PCR: NEGATIVE
Staphylococcus aureus: NEGATIVE

## 2023-05-16 NOTE — Patient Instructions (Addendum)
 Your procedure is scheduled on: Monday, March 17 Report to the Registration Desk on the 1st floor of the [facility] To find out your arrival time, please call [phone] between 1PM - 3PM on: Friday, March 14 If your arrival time is 6:00 am, do not arrive before that time as the [facility] entrance doors do not open until 6:00 am.  REMEMBER: Instructions that are not followed completely may result in serious medical risk, up to and including death; or upon the discretion of your surgeon and anesthesiologist your surgery may need to be rescheduled.  Do not eat food after midnight the night before surgery.  No gum chewing or hard candies.  You may however, drink CLEAR liquids up to 2 hours before you are scheduled to arrive for your surgery. Do not drink anything within 2 hours of your scheduled arrival time.  Clear liquids include: - water  - apple juice without pulp - gatorade (not RED colors) - black coffee or tea (Do NOT add milk or creamers to the coffee or tea) Do NOT drink anything that is not on this list.  In addition, your doctor has ordered for you to drink the provided:  Ensure Pre-Surgery Clear Carbohydrate Drink  Drinking this carbohydrate drink up to two hours before surgery helps to reduce insulin resistance and improve patient outcomes. Please complete drinking 2 hours before scheduled arrival time.  One week prior to surgery: starting today, March 11 Stop Anti-inflammatories (NSAIDS) such as Advil, Aleve, Ibuprofen, Motrin, Naproxen, Naprosyn and Aspirin based products such as Excedrin, Goody's Powder, BC Powder. Stop ANY OVER THE COUNTER supplements until after surgery. Stop vitamin B12, multiple vitamins.  You may however, continue to take Tylenol if needed for pain up until the day of surgery.  Continue taking all of your other prescription medications up until the day of surgery.  ON THE DAY OF SURGERY ONLY TAKE THESE MEDICATIONS WITH SIPS OF  WATER:  Amlodipine Gabapentin Pantoprazole (Protonix)  No Alcohol for 24 hours before or after surgery.  No Smoking including e-cigarettes for 24 hours before surgery.  No chewable tobacco products for at least 6 hours before surgery.  No nicotine patches on the day of surgery.  Do not use any "recreational" drugs for at least a week (preferably 2 weeks) before your surgery.  Please be advised that the combination of cocaine and anesthesia may have negative outcomes, up to and including death. If you test positive for cocaine, your surgery will be cancelled.  On the morning of surgery brush your teeth with toothpaste and water, you may rinse your mouth with mouthwash if you wish. Do not swallow any toothpaste or mouthwash.  Use CHG Soap as directed on instruction sheet.  Do not wear jewelry, make-up, hairpins, clips or nail polish.  For welded (permanent) jewelry: bracelets, anklets, waist bands, etc.  Please have this removed prior to surgery.  If it is not removed, there is a chance that hospital personnel will need to cut it off on the day of surgery.  Do not wear lotions, powders, or perfumes.   Do not shave body hair from the neck down 48 hours before surgery.  Contact lenses, hearing aids and dentures may not be worn into surgery.  Do not bring valuables to the hospital. [facility] is not responsible for any missing/lost belongings or valuables.   Notify your doctor if there is any change in your medical condition (cold, fever, infection).  Wear comfortable clothing (specific to your  surgery type) to the hospital.  After surgery, you can help prevent lung complications by doing breathing exercises.  Take deep breaths and cough every 1-2 hours. Your doctor may order a device called an Incentive Spirometer to help you take deep breaths.  If you are being admitted to the hospital overnight, leave your suitcase in the car. After surgery it may be brought to your room.  In  case of increased patient census, it may be necessary for you, the patient, to continue your postoperative care in the Same Day Surgery department.  If you are being discharged the day of surgery, you will not be allowed to drive home. You will need a responsible individual to drive you home and stay with you for 24 hours after surgery.   If you are taking public transportation, you will need to have a responsible individual with you.  Please call the Pre-admissions Testing Dept. at [phone] if you have any questions about these instructions.  Surgery Visitation Policy:  Patients having surgery or a procedure may have two visitors.  Children under the age of 76 must have an adult with them who is not the patient.  Temporary Visitor Restrictions Due to increasing cases of flu, RSV and COVID-19: Children ages 71 and under will not be able to visit patients in [facility] hospitals under most circumstances.  Inpatient Visitation:    Visiting hours are 7 a.m. to 8 p.m. Up to four visitors are allowed at one time in a patient room. The visitors may rotate out with other people during the day.  One visitor age 73 or older may stay with the patient overnight and must be in the room by 8 p.m.     Pre-operative 5 CHG Bath Instructions   You can play a key role in reducing the risk of infection after surgery. Your skin needs to be as free of germs as possible. You can reduce the number of germs on your skin by washing with CHG (chlorhexidine gluconate) soap before surgery. CHG is an antiseptic soap that kills germs and continues to kill germs even after washing.   DO NOT use if you have an allergy to chlorhexidine/CHG or antibacterial soaps. If your skin becomes reddened or irritated, stop using the CHG and notify one of our RNs at [phone].   Please shower with the CHG soap starting 4 days before surgery using the following schedule:     Please keep in mind the following:  DO  NOT shave, including legs and underarms, starting the day of your first shower.   You may shave your face at any point before/day of surgery.  Place clean sheets on your bed the day you start using CHG soap. Use a clean washcloth (not used since being washed) for each shower. DO NOT sleep with pets once you start using the CHG.   CHG Shower Instructions:  If you choose to wash your hair and private area, wash first with your normal shampoo/soap.  After you use shampoo/soap, rinse your hair and body thoroughly to remove shampoo/soap residue.  Turn the water OFF and apply about 3 tablespoons (45 ml) of CHG soap to a CLEAN washcloth.  Apply CHG soap ONLY FROM YOUR NECK DOWN TO YOUR TOES (washing for 3-5 minutes)  DO NOT use CHG soap on face, private areas, open wounds, or sores.  Pay special attention to the area where your surgery is being performed.  If you are having back surgery, having someone wash  your back for you may be helpful. Wait 2 minutes after CHG soap is applied, then you may rinse off the CHG soap.  Pat dry with a clean towel  Put on clean clothes/pajamas   If you choose to wear lotion, please use ONLY the CHG-compatible lotions on the back of this paper.     Additional instructions for the day of surgery: DO NOT APPLY any lotions, deodorants, cologne, or perfumes.   Put on clean/comfortable clothes.  Brush your teeth.  Ask your nurse before applying any prescription medications to the skin.      CHG Compatible Lotions   Aveeno Moisturizing lotion  Cetaphil Moisturizing Cream  Cetaphil Moisturizing Lotion  Clairol Herbal Essence Moisturizing Lotion, Dry Skin  Clairol Herbal Essence Moisturizing Lotion, Extra Dry Skin  Clairol Herbal Essence Moisturizing Lotion, Normal Skin  Curel Age Defying Therapeutic Moisturizing Lotion with Alpha Hydroxy  Curel Extreme Care Body Lotion  Curel Soothing Hands Moisturizing Hand Lotion  Curel Therapeutic Moisturizing Cream,  Fragrance-Free  Curel Therapeutic Moisturizing Lotion, Fragrance-Free  Curel Therapeutic Moisturizing Lotion, Original Formula  Eucerin Daily Replenishing Lotion  Eucerin Dry Skin Therapy Plus Alpha Hydroxy Crme  Eucerin Dry Skin Therapy Plus Alpha Hydroxy Lotion  Eucerin Original Crme  Eucerin Original Lotion  Eucerin Plus Crme Eucerin Plus Lotion  Eucerin TriLipid Replenishing Lotion  Keri Anti-Bacterial Hand Lotion  Keri Deep Conditioning Original Lotion Dry Skin Formula Softly Scented  Keri Deep Conditioning Original Lotion, Fragrance Free Sensitive Skin Formula  Keri Lotion Fast Absorbing Fragrance Free Sensitive Skin Formula  Keri Lotion Fast Absorbing Softly Scented Dry Skin Formula  Keri Original Lotion  Keri Skin Renewal Lotion Keri Silky Smooth Lotion  Keri Silky Smooth Sensitive Skin Lotion  Nivea Body Creamy Conditioning Oil  Nivea Body Extra Enriched Lotion  Nivea Body Original Lotion  Nivea Body Sheer Moisturizing Lotion Nivea Crme  Nivea Skin Firming Lotion  NutraDerm 30 Skin Lotion  NutraDerm Skin Lotion  NutraDerm Therapeutic Skin Cream  NutraDerm Therapeutic Skin Lotion  ProShield Protective Hand Cream  Provon moisturizing lotion   Preoperative Educational Videos for Total Hip, Knee and Shoulder Replacements  To better prepare for surgery, please view our videos that explain the physical activity and discharge planning required to have the best surgical recovery at [facility].  [email or web address]  Questions? Call [phone] or email [email or web address]

## 2023-05-18 DIAGNOSIS — I10 Essential (primary) hypertension: Secondary | ICD-10-CM | POA: Diagnosis not present

## 2023-05-18 DIAGNOSIS — Z6830 Body mass index (BMI) 30.0-30.9, adult: Secondary | ICD-10-CM | POA: Diagnosis not present

## 2023-05-18 DIAGNOSIS — E538 Deficiency of other specified B group vitamins: Secondary | ICD-10-CM | POA: Diagnosis not present

## 2023-05-18 DIAGNOSIS — N2889 Other specified disorders of kidney and ureter: Secondary | ICD-10-CM | POA: Diagnosis not present

## 2023-05-18 DIAGNOSIS — R5383 Other fatigue: Secondary | ICD-10-CM | POA: Diagnosis not present

## 2023-05-18 DIAGNOSIS — N1832 Chronic kidney disease, stage 3b: Secondary | ICD-10-CM | POA: Diagnosis not present

## 2023-05-18 DIAGNOSIS — J302 Other seasonal allergic rhinitis: Secondary | ICD-10-CM | POA: Diagnosis not present

## 2023-05-18 DIAGNOSIS — G500 Trigeminal neuralgia: Secondary | ICD-10-CM | POA: Diagnosis not present

## 2023-05-18 DIAGNOSIS — Z01818 Encounter for other preprocedural examination: Secondary | ICD-10-CM | POA: Diagnosis not present

## 2023-05-21 MED ORDER — CEFAZOLIN SODIUM-DEXTROSE 2-4 GM/100ML-% IV SOLN
2.0000 g | INTRAVENOUS | Status: AC
  Administered 2023-05-22: 2 g via INTRAVENOUS

## 2023-05-21 MED ORDER — TRANEXAMIC ACID-NACL 1000-0.7 MG/100ML-% IV SOLN
1000.0000 mg | INTRAVENOUS | Status: AC
  Administered 2023-05-22 (×2): 1000 mg via INTRAVENOUS

## 2023-05-21 MED ORDER — DEXAMETHASONE SODIUM PHOSPHATE 10 MG/ML IJ SOLN: 8.0000 mg | Freq: Once | INTRAMUSCULAR | Status: DC

## 2023-05-21 MED ORDER — ORAL CARE MOUTH RINSE: 15.0000 mL | Freq: Once | OROMUCOSAL | Status: AC

## 2023-05-21 MED ORDER — CHLORHEXIDINE GLUCONATE 0.12 % MT SOLN
15.0000 mL | Freq: Once | OROMUCOSAL | Status: AC
Start: 2023-05-21 — End: 2023-05-22
  Administered 2023-05-22: 15 mL via OROMUCOSAL

## 2023-05-22 ENCOUNTER — Encounter
Admission: RE | Disposition: A | Discharge status: Home Health | Payer: Self-pay | Source: Home / Self Care | Attending: Orthopedic Surgery

## 2023-05-22 ENCOUNTER — Ambulatory Visit: Payer: Self-pay | Admitting: Urgent Care

## 2023-05-22 ENCOUNTER — Other Ambulatory Visit: Payer: Self-pay

## 2023-05-22 ENCOUNTER — Inpatient Hospital Stay
Admission: RE | Admit: 2023-05-22 | Discharge: 2023-06-05 | DRG: 470 | Disposition: A | Discharge status: Home Health | Attending: Orthopedic Surgery | Admitting: Orthopedic Surgery

## 2023-05-22 ENCOUNTER — Ambulatory Visit

## 2023-05-22 ENCOUNTER — Ambulatory Visit: Admitting: General Practice

## 2023-05-22 ENCOUNTER — Encounter: Payer: Self-pay | Admitting: Orthopedic Surgery

## 2023-05-22 DIAGNOSIS — N184 Chronic kidney disease, stage 4 (severe): Secondary | ICD-10-CM | POA: Diagnosis present

## 2023-05-22 DIAGNOSIS — Z8249 Family history of ischemic heart disease and other diseases of the circulatory system: Secondary | ICD-10-CM

## 2023-05-22 DIAGNOSIS — Z803 Family history of malignant neoplasm of breast: Secondary | ICD-10-CM

## 2023-05-22 DIAGNOSIS — M1711 Unilateral primary osteoarthritis, right knee: Secondary | ICD-10-CM | POA: Diagnosis not present

## 2023-05-22 DIAGNOSIS — Z833 Family history of diabetes mellitus: Secondary | ICD-10-CM

## 2023-05-22 DIAGNOSIS — Z471 Aftercare following joint replacement surgery: Secondary | ICD-10-CM | POA: Diagnosis not present

## 2023-05-22 DIAGNOSIS — M810 Age-related osteoporosis without current pathological fracture: Secondary | ICD-10-CM | POA: Diagnosis present

## 2023-05-22 DIAGNOSIS — I129 Hypertensive chronic kidney disease with stage 1 through stage 4 chronic kidney disease, or unspecified chronic kidney disease: Secondary | ICD-10-CM | POA: Diagnosis present

## 2023-05-22 DIAGNOSIS — Z807 Family history of other malignant neoplasms of lymphoid, hematopoietic and related tissues: Secondary | ICD-10-CM

## 2023-05-22 DIAGNOSIS — Z90710 Acquired absence of both cervix and uterus: Secondary | ICD-10-CM

## 2023-05-22 DIAGNOSIS — Z96651 Presence of right artificial knee joint: Secondary | ICD-10-CM | POA: Diagnosis not present

## 2023-05-22 DIAGNOSIS — Z8619 Personal history of other infectious and parasitic diseases: Secondary | ICD-10-CM

## 2023-05-22 DIAGNOSIS — Z01812 Encounter for preprocedural laboratory examination: Principal | ICD-10-CM

## 2023-05-22 DIAGNOSIS — Z823 Family history of stroke: Secondary | ICD-10-CM

## 2023-05-22 DIAGNOSIS — E876 Hypokalemia: Secondary | ICD-10-CM | POA: Diagnosis present

## 2023-05-22 DIAGNOSIS — D62 Acute posthemorrhagic anemia: Secondary | ICD-10-CM | POA: Diagnosis not present

## 2023-05-22 DIAGNOSIS — Z7982 Long term (current) use of aspirin: Secondary | ICD-10-CM

## 2023-05-22 HISTORY — PX: TOTAL KNEE ARTHROPLASTY: SHX125

## 2023-05-22 SURGERY — ARTHROPLASTY, KNEE, TOTAL
Anesthesia: Spinal | Site: Knee | Laterality: Right

## 2023-05-22 MED ORDER — PROPOFOL 10 MG/ML IV BOLUS
INTRAVENOUS | Status: DC | PRN
  Administered 2023-05-22: 40 mg via INTRAVENOUS

## 2023-05-22 MED ORDER — SODIUM CHLORIDE 0.9 % IV SOLN: INTRAVENOUS | Status: DC

## 2023-05-22 MED ORDER — OXYCODONE HCL 5 MG PO TABS
ORAL_TABLET | ORAL | Status: AC
  Filled 2023-05-22: qty 1

## 2023-05-22 MED ORDER — LIDOCAINE HCL (CARDIAC) PF 100 MG/5ML IV SOSY
PREFILLED_SYRINGE | INTRAVENOUS | Status: DC | PRN
  Administered 2023-05-22: 30 mg via INTRAVENOUS

## 2023-05-22 MED ORDER — FENTANYL CITRATE (PF) 100 MCG/2ML IJ SOLN
INTRAMUSCULAR | Status: AC
  Filled 2023-05-22: qty 2

## 2023-05-22 MED ORDER — ACETAMINOPHEN 500 MG PO TABS
1000.0000 mg | ORAL_TABLET | Freq: Three times a day (TID) | ORAL | Status: AC
  Administered 2023-05-22: 1000 mg via ORAL
  Filled 2023-05-22 (×3): qty 2

## 2023-05-22 MED ORDER — OXYCODONE HCL 5 MG/5ML PO SOLN: 5.0000 mg | Freq: Once | ORAL | Status: AC | PRN

## 2023-05-22 MED ORDER — HYDROCODONE-ACETAMINOPHEN 5-325 MG PO TABS
ORAL_TABLET | ORAL | Status: AC
  Filled 2023-05-22: qty 2

## 2023-05-22 MED ORDER — CEFAZOLIN SODIUM-DEXTROSE 2-4 GM/100ML-% IV SOLN
INTRAVENOUS | Status: AC
  Filled 2023-05-22: qty 100

## 2023-05-22 MED ORDER — NAPHAZOLINE-GLYCERIN 0.012-0.25 % OP SOLN: 1.0000 [drp] | Freq: Every day | OPHTHALMIC | Status: DC | PRN

## 2023-05-22 MED ORDER — TRAMADOL HCL 50 MG PO TABS
50.0000 mg | ORAL_TABLET | Freq: Four times a day (QID) | ORAL | Status: DC | PRN
  Administered 2023-05-22 – 2023-06-05 (×11): 50 mg via ORAL
  Filled 2023-05-22 (×11): qty 1

## 2023-05-22 MED ORDER — STERILE WATER FOR IRRIGATION IR SOLN
Status: DC | PRN
  Administered 2023-05-22: 1000 mL

## 2023-05-22 MED ORDER — OXYCODONE HCL 5 MG PO TABS
5.0000 mg | ORAL_TABLET | Freq: Once | ORAL | Status: AC | PRN
  Administered 2023-05-22: 5 mg via ORAL

## 2023-05-22 MED ORDER — DOCUSATE SODIUM 100 MG PO CAPS
100.0000 mg | ORAL_CAPSULE | Freq: Two times a day (BID) | ORAL | Status: DC
  Administered 2023-05-22 – 2023-06-05 (×28): 100 mg via ORAL
  Filled 2023-05-22 (×27): qty 1

## 2023-05-22 MED ORDER — ENOXAPARIN SODIUM 30 MG/0.3ML IJ SOSY
30.0000 mg | PREFILLED_SYRINGE | INTRAMUSCULAR | Status: DC
  Administered 2023-05-23 – 2023-06-05 (×14): 30 mg via SUBCUTANEOUS
  Filled 2023-05-22 (×14): qty 0.3

## 2023-05-22 MED ORDER — PHENYLEPHRINE HCL (PRESSORS) 10 MG/ML IV SOLN
INTRAVENOUS | Status: DC | PRN
Start: 2023-05-22 — End: 2023-05-22
  Administered 2023-05-22: 80 ug via INTRAVENOUS

## 2023-05-22 MED ORDER — ACETAMINOPHEN 325 MG PO TABS
325.0000 mg | ORAL_TABLET | Freq: Four times a day (QID) | ORAL | Status: DC | PRN
  Administered 2023-05-26: 650 mg via ORAL
  Administered 2023-05-26: 325 mg via ORAL
  Administered 2023-05-27 – 2023-06-03 (×4): 650 mg via ORAL
  Filled 2023-05-22 (×2): qty 2
  Filled 2023-05-22: qty 1
  Filled 2023-05-22 (×3): qty 2

## 2023-05-22 MED ORDER — METOCLOPRAMIDE HCL 5 MG/ML IJ SOLN: 5.0000 mg | Freq: Three times a day (TID) | INTRAMUSCULAR | Status: DC | PRN

## 2023-05-22 MED ORDER — PHENOL 1.4 % MT LIQD: 1.0000 | OROMUCOSAL | Status: DC | PRN

## 2023-05-22 MED ORDER — PHENYLEPHRINE HCL-NACL 20-0.9 MG/250ML-% IV SOLN
INTRAVENOUS | Status: DC | PRN
  Administered 2023-05-22: 40 ug/min via INTRAVENOUS

## 2023-05-22 MED ORDER — ONDANSETRON HCL 4 MG PO TABS
4.0000 mg | ORAL_TABLET | Freq: Four times a day (QID) | ORAL | Status: DC | PRN
  Administered 2023-05-23 – 2023-05-24 (×2): 4 mg via ORAL
  Filled 2023-05-22 (×2): qty 1

## 2023-05-22 MED ORDER — ACETAMINOPHEN 10 MG/ML IV SOLN
INTRAVENOUS | Status: AC
  Filled 2023-05-22: qty 100

## 2023-05-22 MED ORDER — PHENYLEPHRINE 80 MCG/ML (10ML) SYRINGE FOR IV PUSH (FOR BLOOD PRESSURE SUPPORT)
PREFILLED_SYRINGE | INTRAVENOUS | Status: AC
  Filled 2023-05-22: qty 10

## 2023-05-22 MED ORDER — ONDANSETRON HCL 4 MG/2ML IJ SOLN
INTRAMUSCULAR | Status: DC | PRN
  Administered 2023-05-22: 4 mg via INTRAVENOUS

## 2023-05-22 MED ORDER — ONDANSETRON HCL 4 MG/2ML IJ SOLN
4.0000 mg | Freq: Four times a day (QID) | INTRAMUSCULAR | Status: DC | PRN
  Administered 2023-05-23: 4 mg via INTRAVENOUS

## 2023-05-22 MED ORDER — MENTHOL 3 MG MT LOZG
1.0000 | LOZENGE | OROMUCOSAL | Status: DC | PRN
  Filled 2023-05-22: qty 9

## 2023-05-22 MED ORDER — BUPIVACAINE HCL (PF) 0.5 % IJ SOLN
INTRAMUSCULAR | Status: AC
  Filled 2023-05-22: qty 10

## 2023-05-22 MED ORDER — GABAPENTIN 300 MG PO CAPS
300.0000 mg | ORAL_CAPSULE | Freq: Three times a day (TID) | ORAL | Status: DC
  Administered 2023-05-22 – 2023-06-05 (×42): 300 mg via ORAL
  Filled 2023-05-22 (×3): qty 1
  Filled 2023-05-22: qty 3
  Filled 2023-05-22 (×2): qty 1
  Filled 2023-05-22: qty 3
  Filled 2023-05-22 (×21): qty 1
  Filled 2023-05-22: qty 3
  Filled 2023-05-22 (×2): qty 1
  Filled 2023-05-22 (×2): qty 3
  Filled 2023-05-22 (×4): qty 1
  Filled 2023-05-22: qty 3
  Filled 2023-05-22 (×3): qty 1

## 2023-05-22 MED ORDER — PROPOFOL 10 MG/ML IV BOLUS
INTRAVENOUS | Status: AC
  Filled 2023-05-22: qty 20

## 2023-05-22 MED ORDER — BUPIVACAINE LIPOSOME 1.3 % IJ SUSP
INTRAMUSCULAR | Status: AC
  Filled 2023-05-22: qty 20

## 2023-05-22 MED ORDER — CHLORHEXIDINE GLUCONATE 0.12 % MT SOLN
OROMUCOSAL | Status: AC
  Filled 2023-05-22: qty 15

## 2023-05-22 MED ORDER — HYDROCODONE-ACETAMINOPHEN 5-325 MG PO TABS
1.0000 | ORAL_TABLET | ORAL | Status: DC | PRN
  Administered 2023-05-22: 2 via ORAL
  Administered 2023-05-23: 1 via ORAL
  Administered 2023-05-23 – 2023-05-25 (×6): 2 via ORAL
  Administered 2023-05-27: 1 via ORAL
  Administered 2023-05-28 – 2023-06-02 (×13): 2 via ORAL
  Administered 2023-06-04 – 2023-06-05 (×2): 1 via ORAL
  Filled 2023-05-22: qty 1
  Filled 2023-05-22 (×3): qty 2
  Filled 2023-05-22: qty 1
  Filled 2023-05-22 (×7): qty 2
  Filled 2023-05-22: qty 1
  Filled 2023-05-22 (×2): qty 2
  Filled 2023-05-22: qty 1
  Filled 2023-05-22 (×4): qty 2
  Filled 2023-05-22 (×2): qty 1
  Filled 2023-05-22 (×3): qty 2

## 2023-05-22 MED ORDER — ACETAMINOPHEN 10 MG/ML IV SOLN
INTRAVENOUS | Status: DC | PRN
  Administered 2023-05-22: 1000 mg via INTRAVENOUS

## 2023-05-22 MED ORDER — SODIUM CHLORIDE (PF) 0.9 % IJ SOLN
INTRAMUSCULAR | Status: AC
  Filled 2023-05-22: qty 20

## 2023-05-22 MED ORDER — SODIUM CHLORIDE 0.9 % IR SOLN
Status: DC | PRN
  Administered 2023-05-22: 3000 mL

## 2023-05-22 MED ORDER — FENTANYL CITRATE (PF) 100 MCG/2ML IJ SOLN
25.0000 ug | INTRAMUSCULAR | Status: DC | PRN
Start: 2023-05-22 — End: 2023-05-22
  Administered 2023-05-22: 50 ug via INTRAVENOUS
  Administered 2023-05-22 (×4): 25 ug via INTRAVENOUS

## 2023-05-22 MED ORDER — TRIAMTERENE-HCTZ 37.5-25 MG PO TABS
1.0000 | ORAL_TABLET | Freq: Every morning | ORAL | Status: DC
  Administered 2023-05-23 – 2023-06-05 (×14): 1 via ORAL
  Filled 2023-05-22 (×12): qty 1

## 2023-05-22 MED ORDER — PROPOFOL 500 MG/50ML IV EMUL
INTRAVENOUS | Status: DC | PRN
  Administered 2023-05-22: 60 ug/kg/min via INTRAVENOUS

## 2023-05-22 MED ORDER — BUPIVACAINE HCL (PF) 0.5 % IJ SOLN
INTRAMUSCULAR | Status: DC | PRN
  Administered 2023-05-22: 2.3 mL

## 2023-05-22 MED ORDER — BUPIVACAINE-EPINEPHRINE (PF) 0.25% -1:200000 IJ SOLN
INTRAMUSCULAR | Status: AC
  Filled 2023-05-22: qty 30

## 2023-05-22 MED ORDER — CEFAZOLIN SODIUM-DEXTROSE 2-4 GM/100ML-% IV SOLN
2.0000 g | Freq: Four times a day (QID) | INTRAVENOUS | Status: AC
  Administered 2023-05-22 – 2023-05-23 (×2): 2 g via INTRAVENOUS
  Filled 2023-05-22: qty 100

## 2023-05-22 MED ORDER — LOSARTAN POTASSIUM 50 MG PO TABS
50.0000 mg | ORAL_TABLET | ORAL | Status: DC
  Administered 2023-05-23 – 2023-06-05 (×14): 50 mg via ORAL
  Filled 2023-05-22 (×12): qty 1

## 2023-05-22 MED ORDER — LACTATED RINGERS IV SOLN: INTRAVENOUS | Status: DC

## 2023-05-22 MED ORDER — SODIUM CHLORIDE (PF) 0.9 % IJ SOLN
INTRAMUSCULAR | Status: DC | PRN
  Administered 2023-05-22: 70 mL

## 2023-05-22 MED ORDER — PROPOFOL 1000 MG/100ML IV EMUL
INTRAVENOUS | Status: AC
  Filled 2023-05-22: qty 100

## 2023-05-22 MED ORDER — PANTOPRAZOLE SODIUM 40 MG PO TBEC
40.0000 mg | DELAYED_RELEASE_TABLET | Freq: Every day | ORAL | Status: DC
  Administered 2023-05-22 – 2023-06-05 (×15): 40 mg via ORAL
  Filled 2023-05-22 (×15): qty 1

## 2023-05-22 MED ORDER — SURGIPHOR WOUND IRRIGATION SYSTEM - OPTIME
TOPICAL | Status: DC | PRN
  Administered 2023-05-22: 450 mL via TOPICAL

## 2023-05-22 MED ORDER — MORPHINE SULFATE (PF) 2 MG/ML IV SOLN
0.5000 mg | INTRAVENOUS | Status: DC | PRN
  Administered 2023-05-24 – 2023-05-30 (×7): 1 mg via INTRAVENOUS
  Filled 2023-05-22 (×7): qty 1

## 2023-05-22 MED ORDER — METOCLOPRAMIDE HCL 10 MG PO TABS: 5.0000 mg | ORAL_TABLET | Freq: Three times a day (TID) | ORAL | Status: DC | PRN

## 2023-05-22 MED ORDER — TRANEXAMIC ACID-NACL 1000-0.7 MG/100ML-% IV SOLN
INTRAVENOUS | Status: AC
  Filled 2023-05-22: qty 100

## 2023-05-22 MED ORDER — AMLODIPINE BESYLATE 10 MG PO TABS
10.0000 mg | ORAL_TABLET | ORAL | Status: DC
  Administered 2023-05-23 – 2023-06-05 (×14): 10 mg via ORAL
  Filled 2023-05-22 (×13): qty 1

## 2023-05-22 SURGICAL SUPPLY — 68 items
BLADE REAMER PATELLA SZ29 (BLADE) IMPLANT
BLADE SAGITTAL AGGR TOOTH XLG (BLADE) IMPLANT
BLADE SAW SAG 25X90X1.19 (BLADE) ×1 IMPLANT
BLADE SAW SAG 29X58X.64 (BLADE) ×1 IMPLANT
BNDG ELASTIC 6INX 5YD STR LF (GAUZE/BANDAGES/DRESSINGS) ×1 IMPLANT
BOWL CEMENT MIX W/ADAPTER (MISCELLANEOUS) ×1 IMPLANT
BRUSH SCRUB EZ PLAIN DRY (MISCELLANEOUS) ×1 IMPLANT
CEMENT BONE R 1X40 (Cement) ×2 IMPLANT
CHLORAPREP W/TINT 26 (MISCELLANEOUS) ×2 IMPLANT
COOLER POLAR GLACIER W/PUMP (MISCELLANEOUS) ×1 IMPLANT
CUFF TRNQT CYL 24X4X16.5-23 (TOURNIQUET CUFF) IMPLANT
CUFF TRNQT CYL 30X4X21-28X (TOURNIQUET CUFF) IMPLANT
DERMABOND ADVANCED .7 DNX12 (GAUZE/BANDAGES/DRESSINGS) ×1 IMPLANT
DRAPE SHEET LG 3/4 BI-LAMINATE (DRAPES) ×2 IMPLANT
DRSG MEPILEX SACRM 8.7X9.8 (GAUZE/BANDAGES/DRESSINGS) ×1 IMPLANT
DRSG OPSITE POSTOP 4X10 (GAUZE/BANDAGES/DRESSINGS) IMPLANT
DRSG OPSITE POSTOP 4X8 (GAUZE/BANDAGES/DRESSINGS) IMPLANT
ELECT REM PT RETURN 9FT ADLT (ELECTROSURGICAL) ×1 IMPLANT
ELECTRODE REM PT RTRN 9FT ADLT (ELECTROSURGICAL) ×1 IMPLANT
FEMUR CMT CR STD SZ 8 RT KNEE (Joint) ×1 IMPLANT
FEMUR CMTD CR STD SZ 8 RT KNEE (Joint) IMPLANT
GLOVE BIO SURGEON STRL SZ8 (GLOVE) ×1 IMPLANT
GLOVE BIOGEL PI IND STRL 8 (GLOVE) ×1 IMPLANT
GLOVE PI ORTHO PRO STRL 7.5 (GLOVE) ×2 IMPLANT
GLOVE PI ORTHO PRO STRL SZ8 (GLOVE) ×2 IMPLANT
GLOVE SURG SYN 7.5 E (GLOVE) ×1 IMPLANT
GLOVE SURG SYN 7.5 PF PI (GLOVE) ×1 IMPLANT
GOWN SRG XL LVL 3 NONREINFORCE (GOWNS) ×1 IMPLANT
GOWN STRL REUS W/ TWL LRG LVL3 (GOWN DISPOSABLE) ×1 IMPLANT
GOWN STRL REUS W/ TWL XL LVL3 (GOWN DISPOSABLE) ×1 IMPLANT
HOOD PEEL AWAY T7 (MISCELLANEOUS) ×2 IMPLANT
INSERT TIB AS PERS 8-11X13 (Insert) IMPLANT
KIT TURNOVER KIT A (KITS) ×1 IMPLANT
MANIFOLD NEPTUNE II (INSTRUMENTS) ×1 IMPLANT
MARKER SKIN DUAL TIP RULER LAB (MISCELLANEOUS) ×1 IMPLANT
MAT ABSORB FLUID 56X50 GRAY (MISCELLANEOUS) ×1 IMPLANT
NDL HYPO 21X1.5 SAFETY (NEEDLE) ×1 IMPLANT
NEEDLE HYPO 21X1.5 SAFETY (NEEDLE) ×1 IMPLANT
PACK TOTAL KNEE (MISCELLANEOUS) ×1 IMPLANT
PAD ARMBOARD POSITIONER FOAM (MISCELLANEOUS) ×3 IMPLANT
PAD WRAPON POLAR KNEE (MISCELLANEOUS) ×1 IMPLANT
PENCIL SMOKE EVACUATOR (MISCELLANEOUS) ×1 IMPLANT
PIN DRILL HDLS TROCAR 75 4PK (PIN) IMPLANT
PULSAVAC PLUS IRRIG FAN TIP (DISPOSABLE) ×1 IMPLANT
SCREW FEMALE HEX FIX 25X2.5 (ORTHOPEDIC DISPOSABLE SUPPLIES) IMPLANT
SCREW HEX HEADED 3.5X27 DISP (ORTHOPEDIC DISPOSABLE SUPPLIES) IMPLANT
SLEEVE SCD COMPRESS KNEE MED (STOCKING) ×1 IMPLANT
SOL .9 NS 3000ML IRR UROMATIC (IV SOLUTION) ×1 IMPLANT
SOLUTION IRRIG SURGIPHOR (IV SOLUTION) ×1 IMPLANT
STEM POLY PAT PLY 35M KNEE (Knees) IMPLANT
STEM TIB ST PERS 14+30 (Stem) IMPLANT
STEM TIBIA 5 DEG SZ E R KNEE (Knees) IMPLANT
SUT STRATA 1 CT-1 DLB (SUTURE) ×1 IMPLANT
SUT STRATAFIX 14 PDO 48 VLT (SUTURE) ×1 IMPLANT
SUT STRATAFIX PDO 1 14 VIOLET (SUTURE) ×1 IMPLANT
SUT VIC AB 0 CT1 36 (SUTURE) ×1 IMPLANT
SUT VIC AB 2-0 CT2 27 (SUTURE) ×2 IMPLANT
SUT VICRYL 1-0 27IN ABS (SUTURE) ×1 IMPLANT
SUTURE STRATA SPIR 4-0 18 (SUTURE) ×1 IMPLANT
SUTURE VICRYL 1-0 27IN ABS (SUTURE) ×1 IMPLANT
SYR 20ML LL LF (SYRINGE) ×2 IMPLANT
TAPE CLOTH 3X10 WHT NS LF (GAUZE/BANDAGES/DRESSINGS) ×1 IMPLANT
TIBIA STEM 5 DEG SZ E R KNEE (Knees) ×1 IMPLANT
TIP FAN IRRIG PULSAVAC PLUS (DISPOSABLE) ×1 IMPLANT
TOWEL OR 17X26 4PK STRL BLUE (TOWEL DISPOSABLE) IMPLANT
TRAP FLUID SMOKE EVACUATOR (MISCELLANEOUS) ×1 IMPLANT
WATER STERILE IRR 1000ML POUR (IV SOLUTION) ×1 IMPLANT
WRAPON POLAR PAD KNEE (MISCELLANEOUS) ×1 IMPLANT

## 2023-05-22 NOTE — Anesthesia Procedure Notes (Signed)
 Spinal  Patient location during procedure: OR Start time: 05/22/2023 1:00 PM End time: 05/22/2023 1:00 PM Reason for block: surgical anesthesia Staffing Performed: resident/CRNA  Resident/CRNA: Monico Hoar, CRNA Performed by: Monico Hoar, CRNA Authorized by: Stephanie Coup, MD   Preanesthetic Checklist Completed: patient identified, IV checked, site marked, risks and benefits discussed, surgical consent, monitors and equipment checked, pre-op evaluation and timeout performed Spinal Block Patient position: sitting Prep: Betadine Patient monitoring: heart rate, continuous pulse ox, blood pressure and cardiac monitor Approach: midline Location: L4-5 Injection technique: single-shot Needle Needle type: Whitacre, Introducer and Pencan  Needle gauge: 25 G Needle length: 10 cm Assessment Sensory level: T6 Events: CSF return Additional Notes Negative paresthesia. Negative blood return. Positive free-flowing CSF. Expiration date of kit checked and confirmed. Patient tolerated procedure well, without complications.

## 2023-05-22 NOTE — Op Note (Signed)
 Patient Name: Teresa Johns  WUJ:[id number]  Pre-Operative Diagnosis: Right knee Osteoarthritis  Post-Operative Diagnosis: (same)  Procedure: Right Total Knee Arthroplasty  Components/Implants: Femur: Persona Size 8 CR   Tibia: Persona Size E w/ 14x90mm stem extension  Poly: 13mm MC  Patella: 35x51mm symmetric  Femoral Valgus Cut Angle: 5 degrees  Distal Femoral Re-cut: none  Patella Resurfacing: yes   Date of Surgery: 05/22/2023  Surgeon: Reinaldo Berber MD  Assistant: Amador Cunas PA (present and scrubbed throughout the case, critical for assistance with exposure, retraction, instrumentation, and closure), [place] PAs   Anesthesiologist: Lorette Ang  Anesthesia: Spinal   Tourniquet Time: 58 min  EBL: 50cc  IVF: 800cc  Complications: None   Brief history: The patient is a 89 year old female with a history of osteoarthritis of the right knee with pain limiting their range of motion and activities of daily living, which has failed multiple attempts at conservative therapy.  The risks and benefits of total knee arthroplasty as definitive surgical treatment were discussed with the patient, who opted to proceed with the operation.  After outpatient medical clearance and optimization was completed the patient was admitted to [facility] for the procedure.  All preoperative films were reviewed and an appropriate surgical plan was made prior to surgery. Preoperative range of motion was 10 to 95 with a 10 flexion contracture.   Description of procedure: The patient was brought to the operating room where laterality was confirmed by all those present to be the right side.   Spinal anesthesia was administered and the patient received an intravenous dose of antibiotics for surgical prophylaxis and a dose of tranexamic acid.  Patient is positioned supine on the operating room table with all bony prominences well-padded.  A well-padded tourniquet was applied to the right  thigh.  The knee was then prepped and draped in usual sterile fashion with multiple layers of adhesive and nonadhesive drapes.  All of those present in the operating room participated in a surgical timeout laterality and patient were confirmed.   An Esmarch was wrapped around the extremity and the leg was elevated and the knee flexed.  The tourniquet was inflated to a pressure of 250 mmHg. The Esmarch was removed and the leg was brought down to full extension.  The patella and tibial tubercle identified and outlined using a marking pen and a midline skin incision was made with a knife carried through the subcutaneous tissue down to the extensor retinaculum.  After exposure of the extensor mechanism the medial parapatellar arthrotomy was performed with a scalpel and electrocautery extending down medial and distal to the tibial tubercle taking care to avoid incising the patellar tendon.   A standard medial release was performed over the proximal tibia.  The knee was brought into extension in order to excise the fat pad taking care not to damage the patella tendon.  The superior soft tissue was removed from the anterior surface of the distal femur to visualize for the procedure.  The knee was then brought into flexion with the patella subluxed laterally and subluxing the tibia anteriorly.  The ACL was transected and removed with electrocautery and additional soft tissue was removed from the proximal surface of the tibia to fully expose. The PCL was found to be intact and was preserved.  An extramedullary tibial cutting guide was then applied to the leg with a spring-loaded ankle clamp placed around the distal tibia just above the malleoli the angulation of the guide was adjusted to give some  posterior slope in the tibial resection with an appropriate varus/valgus alignment.  The resection guide was then pinned to the proximal tibia and the proximal tibial surface was resected with an oscillating saw.  Careful  attention was paid to ensure the blade did not disrupt any of the soft tissues including any lateral or medial ligament.  Attention was then turned to the femur, with the knee slightly flexed a opening drill was used to enter the medullary canal of the femur.  After removing the drill marrow was suctioned out to decompress the distal femur.  An intramedullary femoral guide was then inserted into the drill hole and the alignment guide was seated firmly against the distal end of the medial femoral condyle.  The distal femoral cutting guide was then attached and pinned securely to the anterior surface of the femur and the intramedullary rod and alignment guide was removed.  Distal femur resection was then performed with an oscillating saw with retractors protecting medial and laterally.   The distal cutting block was then removed and the extension gap was checked with a spacer.  Extension gap was found to be appropriately sized to accommodate the spacer block.   The femoral sizing guide was then placed securely into the posterior condyles of the femur and the femoral size was measured and determined to be 8.  The size 8; 4-in-1 cutting guide was placed in position and secured with 2 pins.  The anterior posterior and chamfer resections were then performed with an oscillating saw.  Bony fragments and osteophytes were then removed.  Using a lamina spreader the posterior medial and lateral condyles were checked for additional osteophytes and posterior soft tissue remnants.  Any remaining meniscus was removed at this time.  Periarticular injection was performed in the meniscal rims and posterior capsule with aspiration performed to ensure no intravascular injection.   The tibia was then exposed and the tibial trial was pinned onto the plateau after confirming appropriate orientation and rotation.  Using the drill bushing the tibia was prepared to the appropriate drill depth.  Tibial broach impactor was then driven  through the punch guide using a mallet.  The femoral trial component was then inserted onto the femur. A trial tibial polyethylene bearing was then placed and the knee was reduced.  The knee achieved full extension with no hyperextension and was found to be balanced in flexion and extension with the trials in place.  The knee was then brought into full extension the patella was everted and held with 2 Kocher clamps.  The articular surface of the patella was then resected with an patella reamer and saw after careful measurement with a caliper.  The patella was then prepared with the drill guide and a trial patella was placed.  The knee was then taken through range of motion and it was found that the patella articulated appropriately with the trochlea and good patellofemoral motion without subluxation.    The correct final components for implantation were confirmed and opened by the circulator nurse.  A bone plug was fashioned from the patient's bone cuts to plug the intramedullary femoral canal access hole and was tamped into place.  The prepared surfaces of the patella femur and tibia were cleaned with pulsatile lavage to remove all blood fat and other material and then the surfaces were dried.  2 bags of cement were mixed under vacuum and the components were cemented into place.  Excess cement was removed with curettes and forceps. A trial polyethylene  tibial component was placed and the knee was brought into extension to allow the cement to set.  At this time the periarticular injection cocktail was placed in the soft tissues surrounding the knee.  After full curing of the cement the balance of the knee was checked again and the final polyethylene size was confirmed. The tibial component was irrigated and locking mechanism checked to ensure it was clear of debris. The real polyethylene tibial component was implanted and the knee was brought through a range of motion.   The knee was then irrigated with copious  amount of normal saline via pulsatile lavage to remove all loose bodies and other debris.  The knee was then irrigated with surgiphor betadine based wash and reirrigated with saline.  The tourniquet was then dropped and all bleeding vessels were identified and coagulated.  The arthrotomy was approximated with #1 Vicryl and closed with #1 Stratafix suture.  The knee was brought into slight flexion and the subcutaneous tissues were closed with 0 Vicryl, 2-0 Vicryl and a running subcuticular 4-0 stratafix barbed suture.  Skin was then glued with Dermabond.  A sterile adhesive dressing was then placed along with a sequential compression device to the calf, a Ted stocking, and a cryotherapy cuff.   Sponge, needle, and Lap counts were all correct at the end of the case.   The patient was transferred off of the operating room table to a hospital bed, good pulses were found distally on the operative side.  The patient was transferred to the recovery room in stable condition.

## 2023-05-22 NOTE — Anesthesia Preprocedure Evaluation (Addendum)
 Anesthesia Evaluation  Patient identified by MRN, date of birth, ID band Patient awake    Reviewed: Allergy & Precautions, NPO status , Patient's Chart, lab work & pertinent test results  History of Anesthesia Complications (+) PONV and history of anesthetic complications  Airway Mallampati: III  TM Distance: >3 FB Neck ROM: full    Dental  (+) Chipped, Dental Advidsory Given   Pulmonary neg pulmonary ROS   Pulmonary exam normal        Cardiovascular hypertension, On Medications Normal cardiovascular exam     Neuro/Psych negative neurological ROS  negative psych ROS   GI/Hepatic Neg liver ROS,GERD  Medicated and Controlled,,  Endo/Other  negative endocrine ROS    Renal/GU Renal disease     Musculoskeletal   Abdominal   Peds  Hematology negative hematology ROS (+)   Anesthesia Other Findings Past Medical History: No date: Adrenal adenoma, left     Comment:  benign No date: Anemia No date: CKD (chronic kidney disease) stage 4, GFR 15-29 ml/min (HCC) No date: Colitis No date: Complication of anesthesia     Comment:  bp dropped during EGD/during hand surgery bp became               elevated No date: GERD (gastroesophageal reflux disease) No date: Helicobacter pylori gastritis No date: Hiatal hernia No date: History of chicken pox No date: History of Helicobacter pylori infection No date: History of rectal bleeding No date: Left renal mass No date: Low vitamin D level No date: Neuropathy No date: Osteoporosis No date: Presbyesophagus No date: Primary hypertension No date: Primary osteoarthritis involving multiple joints No date: Renal insufficiency No date: Seasonal allergies No date: Spinal stenosis of lumbar region No date: Tinnitus  Past Surgical History: No date: COLONOSCOPY     Comment:  several 11/20/2020: ESOPHAGOGASTRODUODENOSCOPY; N/A     Comment:  Procedure: ESOPHAGOGASTRODUODENOSCOPY  (EGD);  Surgeon:               Jaynie Collins, DO;  Location: [facility] ENDOSCOPY;                Service: Gastroenterology;  Laterality: N/A; No date: GANGLION CYST EXCISION; Right     Comment:  index finger 01/25/2023: LUMBAR LAMINECTOMY/DECOMPRESSION MICRODISCECTOMY; N/A     Comment:  Procedure: L2-S1 DECOMPRESSION;  Surgeon: Venetia Night, MD;  Location: [facility] ORS;  Service: Neurosurgery;              Laterality: N/A; No date: PAROTID STONE REMOVAL No date: ROTATOR CUFF TEAR SURGERY; Right No date: VAGINAL HYSTERECTOMY     Comment:  partial  BMI    Body Mass Index: 28.87 kg/m      Reproductive/Obstetrics negative OB ROS                             Anesthesia Physical Anesthesia Plan  ASA: 2  Anesthesia Plan: Spinal and General/Spinal   Post-op Pain Management:    Induction:   PONV Risk Score and Plan: 3 and Ondansetron, Dexamethasone, Propofol infusion and Midazolam  Airway Management Planned: Natural Airway and Nasal Cannula  Additional Equipment:   Intra-op Plan:   Post-operative Plan:   Informed Consent: I have reviewed the patients History and Physical, chart, labs and discussed the procedure including the risks, benefits and alternatives for the proposed anesthesia with the patient or authorized representative who has  indicated his/her understanding and acceptance.     Dental Advisory Given  Plan Discussed with: Anesthesiologist, CRNA and Surgeon  Anesthesia Plan Comments: (Patient reports no bleeding problems and no anticoagulant use.  Plan for spinal with backup GA  Patient consented for risks of anesthesia including but not limited to:  - adverse reactions to medications - damage to eyes, teeth, lips or other oral mucosa - nerve damage due to positioning  - risk of bleeding, infection and or nerve damage from spinal that could lead to paralysis - risk of headache or failed spinal - damage to teeth,  lips or other oral mucosa - sore throat or hoarseness - damage to heart, brain, nerves, lungs, other parts of body or loss of life  Patient voiced understanding and assent.)       Anesthesia Quick Evaluation

## 2023-05-22 NOTE — H&P (Signed)
 History of Present Illness: The patient is an 89 y.o. female presents for history and physical for right total knee arthroplasty with Dr. Audelia Acton on 05/22/2023. She has advanced right knee osteoarthritis with complete loss of joint space. Her knee will frequently buckle and give way on her. She has stiffness, decreased range of motion that is affecting her function, quality of life and activities day living. Patient is seen Dr. Audelia Acton discussed total knee arthroplasty and agreed and consented the procedure.  The patient is a non-smoker, nondiabetic with an A1c of 5.5 and a BMI of 29.26. She denies any history of metal allergy or reactions or rashes to metal.  Past Medical History: Past Medical History:  Diagnosis Date  Chicken pox  Colitis, unspecified  rectal bleed  Hypertension  Tinnitus   Past Surgical History: Past Surgical History:  Procedure Laterality Date  EGD 11/20/2020  Gastritis +H.Pylori.Treated/Intestinal metaplasia/Repeat 6 months/SMR  HYSTERECTOMY  PAROTID STONE REMOVAL  RIGHT 2ND DIP GANGLION REMOVAL  ROTATOR CUFF TEAR SURGERY   Past Family History: Family History  Problem Relation Age of Onset  Myocardial Infarction (Heart attack) Mother  High blood pressure (Hypertension) Mother  Lymphoma Father  Diabetes type II Sister  Breast cancer Maternal Aunt  Rheum arthritis Other  Stroke Other  Diabetes Other   Medications: Current Outpatient Medications  Medication Sig Dispense Refill  acetaminophen (TYLENOL) 500 MG tablet Take 500 mg by mouth  acetaminophen (TYLENOL) 500 MG tablet Take 500 mg by mouth every 8 (eight) hours As needed  amLODIPine (NORVASC) 10 MG tablet TAKE 1 TABLET BY MOUTH DAILY 90 tablet 0  bismuth subsalicylate (PEPTO-BISMOL) 262 mg chewable tablet Take 524 mg by mouth once daily as needed  calcium carbonate 300 mg (750 mg) chewable tablet Take 1 tablet by mouth once daily as needed for Heartburn  cholecalciferol (VITAMIN D3) 1000 unit tablet  Take 1,000 Units by mouth once daily  docusate (COLACE) 100 MG capsule Take 1 capsule (100 mg total) by mouth 2 (two) times daily (Patient taking differently: Take 100 mg by mouth once daily) 60 capsule 3  ferrous sulfate (IRON ORAL) Take 65 mg by mouth once daily  losartan (COZAAR) 50 MG tablet take 1 tablet by mouth once daily 90 tablet 1  pantoprazole (PROTONIX) 40 MG DR tablet TAKE ONE TABLET BY MOUTH ONCE DAILY 90 tablet 1  sennosides (SENOKOT) 8.6 mg tablet Take 8.6 mg by mouth once daily As needed  triamterene-hydroCHLOROthiazide (DYAZIDE) 37.5-25 mg capsule TAKE 1 CAPSULE BY MOUTH EACH MORNING 90 capsule 1  cyanocobalamin (VITAMIN B12) 1000 MCG tablet Take 1 tablet (1,000 mcg total) by mouth once daily 90 tablet 1  gabapentin (NEURONTIN) 300 MG capsule Take 1 capsule (300 mg total) by mouth 3 (three) times daily for 90 days 270 capsule 1  HYDROcodone-acetaminophen (NORCO) 5-325 mg tablet TAKE 1/2-1 TABLET BY MOUTH TWICE DAILY AS NEEDED 60 tablet 0  loratadine (CLARITIN) 10 mg capsule Take 10 mg by mouth once daily  montelukast (SINGULAIR) 10 mg tablet Take 1 tablet (10 mg total) by mouth at bedtime 30 tablet 5  multivitamin tablet Take 1 tablet by mouth once daily  multivitamin with minerals tablet Take 1 tablet by mouth once daily   No current facility-administered medications for this visit.   Allergies: No Known Allergies   Visit Vitals: Vitals:  05/10/23 1521  BP: 122/62    Review of Systems:  A comprehensive 14 point ROS was performed, reviewed, and the pertinent orthopaedic findings are documented in the  HPI.  Physical Exam: General:  Well developed, well nourished, no apparent distress, normal affect, normal gait with no antalgic component.   HEENT: Head normocephalic, atraumatic, PERRL.   Abdomen: Soft, non tender, non distended, Bowel sounds present.  Heart: Examination of the heart reveals regular, rate, and rhythm. There is no murmur noted on ascultation.  There is a normal apical pulse.  Lungs: Lungs are clear to auscultation. There is no wheeze, rhonchi, or crackles. There is normal expansion of bilateral chest walls.   Comprehensive Knee Exam: Gait Antalgic with a cane  Alignment Neutral   Inspection Right Left  Skin Normal appearance with no obvious deformity. No ecchymosis or erythema. Normal appearance with no obvious deformity. No ecchymosis or erythema.  Soft Tissue No focal soft tissue swelling No focal soft tissue swelling  Quad Atrophy None None   Palpation  Right Left  Tenderness Medial joint Line parapatellar tenderness to palpation Medial joint line and parapatellar tenderness palpation  Crepitus + patellofemoral and tibiofemoral crepitus + patellofemoral and tibiofemoral crepitus  Effusion None None   Range of Motion Right Left  Flexion 10-95 5-110  Extension 10 degree flexion contracture Full passive knee extension without hyperextension   Ligamentous Exam Right Left  Lachman Normal Normal  Valgus 0 Normal Normal  Valgus 30 Normal Normal  Varus 0 Normal Normal  Varus 30 Normal Normal  Anterior Drawer Normal Normal  Posterior Drawer Normal Normal   Meniscal Exam Right Left  Hyperflexion Test Positive Negative  Hyperextension Test Positive Positive  McMurray's Positive Positive   Neurovascular Right Left  Quadriceps Strength 5/5 5/5  Hamstring Strength 5/5 5/5  Hip Abductor Strength 4/5 4/5  Distal Motor Normal Normal  Distal Sensory Normal light touch sensation Normal light touch sensation  Distal Pulses Normal Normal    Imaging Studies: I have reviewed AP, lateral,sunrise, and flexed PA weight bearing knee X-rays (5 views) of the bilateral knee reviewed by me in the office show severe degenerative changes to the bilateral knees. Both knees show medial joint space narrowing bone-on-bone articulation with tricompartmental degenerative changes with osteophyte formation, sclerosis and subchondral cyst  formation. Kellgren-Lawrence grade 4 in both knees. No fractures or dislocations noted in either knee.   Assessment:  Advanced right knee osteoarthritis  Plan: Teresa Johns is an 89 year old female presents with advanced right knee osteoarthritis. She has complete loss of joint space on plain film x-rays. Patient's pain is severe debilitating and interfering with quality of life and activities a living. She has had no relief with conservative treatment. Knee is frequently buckling and giving away on her limiting her ability to walk. Risks, benefits, complications of a right total knee arthroplasty were discussed with the patient. Patient has agreed and consented procedure with Dr. Audelia Acton on 05/22/2023  The hospitalization and post-operative care and rehabilitation were also discussed. The use of perioperative antibiotics and DVT prophylaxis were discussed. The risk, benefits and alternatives to a surgical intervention were discussed at length with the patient. The patient was also advised of risks related to the medical comorbidities and elevated body mass index (BMI). A lengthy discussion took place to review the most common complications including but not limited to: stiffness, loss of function, complex regional pain syndrome, deep vein thrombosis, pulmonary embolus, heart attack, stroke, infection, wound breakdown, numbness, intraoperative fracture, damage to nerves, tendon,muscles, arteries or other blood vessels, death and other possible complications from anesthesia. The patient was told that we will take steps to minimize these risks by using sterile technique,  antibiotics and DVT prophylaxis when appropriate and follow the patient postoperatively in the office setting to monitor progress. The possibility of recurrent pain, no improvement in pain and actual worsening of pain were also discussed with the patient.   All questions answered patient agrees with above plan for right total knee arthroplasty.

## 2023-05-22 NOTE — Plan of Care (Signed)
  Problem: Activity: Goal: Range of joint motion will improve Outcome: Progressing   Problem: Clinical Measurements: Goal: Postoperative complications will be avoided or minimized Outcome: Progressing   Problem: Pain Management: Goal: Pain level will decrease with appropriate interventions Outcome: Progressing   

## 2023-05-22 NOTE — Interval H&P Note (Signed)
Patient history and physical updated. Consent reviewed including risks, benefits, and alternatives to surgery. Patient agrees with above plan to proceed with right total knee arthroplasty  

## 2023-05-22 NOTE — Transfer of Care (Signed)
 Immediate Anesthesia Transfer of Care Note  Patient: Teresa Johns  Procedure(s) Performed: ARTHROPLASTY, KNEE, TOTAL (Right: Knee)  Patient Location: PACU  Anesthesia Type:Spinal  Level of Consciousness: awake, alert , and oriented  Airway & Oxygen Therapy: Patient Spontanous Breathing  Post-op Assessment: Report given to RN and Post -op Vital signs reviewed and stable  Post vital signs: Reviewed and stable  Last Vitals:  Vitals Value Taken Time  BP 88/74 05/22/23 1506  Temp    Pulse 84 05/22/23 1509  Resp 15 05/22/23 1509  SpO2 95 % 05/22/23 1509  Vitals shown include unfiled device data.  Last Pain:  Vitals:   05/22/23 1202  TempSrc: Temporal  PainSc: 0-No pain         Complications: No notable events documented.

## 2023-05-23 ENCOUNTER — Encounter: Payer: Self-pay | Admitting: Orthopedic Surgery

## 2023-05-23 LAB — BASIC METABOLIC PANEL
Anion gap: 5 (ref 5–15)
BUN: 20 mg/dL (ref 8–23)
CO2: 27 mmol/L (ref 22–32)
Calcium: 9.1 mg/dL (ref 8.9–10.3)
Chloride: 105 mmol/L (ref 98–111)
Creatinine, Ser: 1.1 mg/dL — ABNORMAL HIGH (ref 0.44–1.00)
GFR, Estimated: 49 mL/min — ABNORMAL LOW (ref >60)
Glucose, Bld: 121 mg/dL — ABNORMAL HIGH (ref 70–99)
Potassium: 3.4 mmol/L — ABNORMAL LOW (ref 3.5–5.1)
Sodium: 137 mmol/L (ref 135–145)

## 2023-05-23 LAB — CBC
HCT: 32 % — ABNORMAL LOW (ref 36.0–46.0)
Hemoglobin: 10.7 g/dL — ABNORMAL LOW (ref 12.0–15.0)
MCH: 31.5 pg (ref 26.0–34.0)
MCHC: 33.4 g/dL (ref 30.0–36.0)
MCV: 94.1 fL (ref 80.0–100.0)
Platelets: 207 10*3/uL (ref 150–400)
RBC: 3.4 MIL/uL — ABNORMAL LOW (ref 3.87–5.11)
RDW: 13.2 % (ref 11.5–15.5)
WBC: 7.4 10*3/uL (ref 4.0–10.5)
nRBC: 0 % (ref 0.0–0.2)

## 2023-05-23 MED ORDER — GABAPENTIN 300 MG PO CAPS
ORAL_CAPSULE | ORAL | Status: AC
  Filled 2023-05-23: qty 1

## 2023-05-23 MED ORDER — DOCUSATE SODIUM 100 MG PO CAPS
ORAL_CAPSULE | ORAL | Status: AC
Start: 2023-05-23 — End: ?
  Filled 2023-05-23: qty 1

## 2023-05-23 MED ORDER — LOSARTAN POTASSIUM 50 MG PO TABS
ORAL_TABLET | ORAL | Status: AC
  Filled 2023-05-23: qty 1

## 2023-05-23 MED ORDER — FAMOTIDINE 20 MG PO TABS
10.0000 mg | ORAL_TABLET | Freq: Every day | ORAL | Status: DC
  Administered 2023-05-23 – 2023-06-05 (×14): 10 mg via ORAL
  Filled 2023-05-23 (×14): qty 1

## 2023-05-23 MED ORDER — POTASSIUM CHLORIDE 20 MEQ PO PACK
20.0000 meq | PACK | Freq: Two times a day (BID) | ORAL | Status: AC
  Administered 2023-05-23 (×2): 20 meq via ORAL
  Filled 2023-05-23 (×2): qty 1

## 2023-05-23 MED ORDER — TRIAMTERENE-HCTZ 37.5-25 MG PO TABS
ORAL_TABLET | ORAL | Status: AC
  Filled 2023-05-23: qty 1

## 2023-05-23 MED ORDER — AMLODIPINE BESYLATE 5 MG PO TABS
ORAL_TABLET | ORAL | Status: AC
  Filled 2023-05-23: qty 2

## 2023-05-23 NOTE — NC FL2 (Signed)
 [place] MEDICAID FL2 LEVEL OF CARE FORM     IDENTIFICATION  Patient Name: Teresa Johns Birthdate: Apr 19, 1934 Sex: female Admission Date (Current Location): 05/22/2023  [facility] and [place] Number:  Chiropodist and Address:  [facility], [address] [place], [place] [address]      Provider Number: [id number]  Attending Physician Name and Address:  Reinaldo Berber, MD  Relative Name and Phone Number:  Marvelous Woolford  Son  Emergency Contact  [phone]    Current Level of Care: Hospital Recommended Level of Care: Skilled Nursing Facility Prior Approval Number:    Date Approved/Denied:   PASRR Number: [id number]  Discharge Plan: SNF    Current Diagnoses: Patient Active Problem List   Diagnosis Date Noted   S/P total knee arthroplasty, right 05/22/2023   Lumbar stenosis 01/25/2023   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   Kidney mass 01/17/2023   Hypertension 09/06/2019   Osteoporosis 07/04/2019   Seasonal allergies 07/04/2019   Low vitamin D level 01/01/2014    Orientation RESPIRATION BLADDER Height & Weight     Self, Time, Situation, Place  Normal Continent, External catheter Weight: 73.9 kg Height:  5\' 3"  (160 cm)  BEHAVIORAL SYMPTOMS/MOOD NEUROLOGICAL BOWEL NUTRITION STATUS      Continent Diet (regular)  AMBULATORY STATUS COMMUNICATION OF NEEDS Skin   Extensive Assist   Normal, Surgical wounds                       Personal Care Assistance Level of Assistance  Bathing, Feeding, Dressing Bathing Assistance: Limited assistance Feeding assistance: Limited assistance Dressing Assistance: Maximum assistance     Functional Limitations Info  Sight, Hearing, Speech Sight Info: Adequate Hearing Info: Adequate Speech Info: Adequate    SPECIAL CARE FACTORS FREQUENCY  PT (By licensed PT), OT (By licensed OT)     PT Frequency: 5 times per week OT Frequency: 5 times per week             Contractures Contractures Info: Not present    Additional Factors Info  Code Status, Allergies Code Status Info: full code Allergies Info: NKDA           Current Medications (05/23/2023):  This is the current hospital active medication list Current Facility-Administered Medications  Medication Dose Route Frequency Provider Last Rate Last Admin   0.9 %  sodium chloride infusion   Intravenous Continuous Reinaldo Berber, MD 100 mL/hr at 05/22/23 1645 New Bag at 05/22/23 1645   acetaminophen (TYLENOL) tablet 1,000 mg  1,000 mg Oral Q8H Reinaldo Berber, MD   1,000 mg at 05/22/23 2310   acetaminophen (TYLENOL) tablet 325-650 mg  325-650 mg Oral Q6H PRN Reinaldo Berber, MD       amLODipine (NORVASC) tablet 10 mg  10 mg Oral Carmina Miller, Earna Coder, MD   10 mg at 05/23/23 0734   docusate sodium (COLACE) capsule 100 mg  100 mg Oral BID Reinaldo Berber, MD   100 mg at 05/23/23 0940   enoxaparin (LOVENOX) injection 30 mg  30 mg Subcutaneous Q24H Reinaldo Berber, MD   30 mg at 05/23/23 0734   famotidine (PEPCID) tablet 10 mg  10 mg Oral Daily Evon Slack, PA-C   10 mg at 05/23/23 1301   gabapentin (NEURONTIN) capsule 300 mg  300 mg Oral TID Reinaldo Berber, MD   300 mg at 05/23/23 1536   HYDROcodone-acetaminophen (NORCO/VICODIN) 5-325 MG per tablet 1-2 tablet  1-2 tablet  Oral Q4H PRN Reinaldo Berber, MD   2 tablet at 05/23/23 0457   losartan (COZAAR) tablet 50 mg  50 mg Oral Carmina Miller, Earna Coder, MD   50 mg at 05/23/23 2595   menthol-cetylpyridinium (CEPACOL) lozenge 3 mg  1 lozenge Oral PRN Reinaldo Berber, MD       Or   phenol (CHLORASEPTIC) mouth spray 1 spray  1 spray Mouth/Throat PRN Reinaldo Berber, MD       metoCLOPramide (REGLAN) tablet 5-10 mg  5-10 mg Oral Q8H PRN Reinaldo Berber, MD       Or   metoCLOPramide (REGLAN) injection 5-10 mg  5-10 mg Intravenous Q8H PRN Reinaldo Berber, MD       morphine (PF) 2 MG/ML injection 0.5-1 mg  0.5-1 mg Intravenous Q2H PRN  Reinaldo Berber, MD       naphazoline-glycerin (CLEAR EYES REDNESS) ophth solution 1-2 drop  1-2 drop Both Eyes Daily PRN Reinaldo Berber, MD       ondansetron [facility]) tablet 4 mg  4 mg Oral Q6H PRN Reinaldo Berber, MD   4 mg at 05/23/23 1301   Or   ondansetron (ZOFRAN) injection 4 mg  4 mg Intravenous Q6H PRN Reinaldo Berber, MD       pantoprazole (PROTONIX) EC tablet 40 mg  40 mg Oral Daily Reinaldo Berber, MD   40 mg at 05/23/23 0940   potassium chloride (KLOR-CON) packet 20 mEq  20 mEq Oral BID Evon Slack, PA-C   20 mEq at 05/23/23 0940   traMADol (ULTRAM) tablet 50 mg  50 mg Oral Q6H PRN Reinaldo Berber, MD   50 mg at 05/23/23 0949   triamterene-hydrochlorothiazide (MAXZIDE-25) 37.5-25 MG per tablet 1 tablet  1 tablet Oral q morning Reinaldo Berber, MD   1 tablet at 05/23/23 0940     Discharge Medications: Please see discharge summary for a list of discharge medications.  Relevant Imaging Results:  Relevant Lab Results:   Additional Information SS3 [id number]  Marlowe Sax, RN

## 2023-05-23 NOTE — Evaluation (Signed)
 Physical Therapy Evaluation Patient Details Name: Teresa Johns MRN: [id number] DOB: 09-Feb-1935 Today's Date: 05/23/2023  History of Present Illness  Patient is a 89 year old with right knee osteoarthritis. S/p right total knee arthroplasty. History of back surgery  Clinical Impression  Patient is agreeable to PT evaluation. She lives alone in a single story home with 2 steps to enter. She is ambulatory with a cane at home or uses furniture for support.  Today the patient required Min A for bed mobility. She required Min A for standing from bed with cues for technique and safety. Gait training initiated with cues for improved gait kinematics. She required Min A with distance of 69ft, activity tolerance limited by chronic shoulder pain/fatigue and right knee pain. She is unable to walk a household distance at this time. Recommend PT follow up to maximize independence and facilitate return to prior level of function. Anticipate  patient could benefit from rehabilitation < 3 hours/day after this hospital stay.       If plan is discharge home, recommend the following: A lot of help with walking and/or transfers;A lot of help with bathing/dressing/bathroom;Assist for transportation;Help with stairs or ramp for entrance;Assistance with cooking/housework   Can travel by private vehicle   Yes    Equipment Recommendations None recommended by PT  Recommendations for Other Services       Functional Status Assessment Patient has had a recent decline in their functional status and demonstrates the ability to make significant improvements in function in a reasonable and predictable amount of time.     Precautions / Restrictions Precautions Precautions: Fall;Knee Precaution Booklet Issued: Yes (comment) Recall of Precautions/Restrictions: Intact Restrictions Weight Bearing Restrictions Per Provider Order: Yes RLE Weight Bearing Per Provider Order: Weight bearing as tolerated      Mobility  Bed  Mobility Overal bed mobility: Needs Assistance Bed Mobility: Supine to Sit     Supine to sit: Min assist     General bed mobility comments: assistance for trunk support. verbal cues for technique    Transfers Overall transfer level: Needs assistance Equipment used: Rolling walker (2 wheels) Transfers: Sit to/from Stand Sit to Stand: Min assist           General transfer comment: lifting assistance required for standing. cues for safe hand placement with standing and RLE positioning with sitting    Ambulation/Gait Ambulation/Gait assistance: Min assist Gait Distance (Feet): 25 Feet Assistive device: Rolling walker (2 wheels) Gait Pattern/deviations: Step-to pattern, Decreased stride length, Decreased stance time - right, Decreased dorsiflexion - right Gait velocity: decreased     General Gait Details: several standing rest breaks required with short distance ambulation secondary to shoulder pain/fatigue and right leg pain. cues for sequencing of rolling walker and BLE. she is currently unable to walk a household distance  Careers information officer     Tilt Bed    Modified Rankin (Stroke Patients Only)       Balance Overall balance assessment: Needs assistance Sitting-balance support: Feet supported Sitting balance-Leahy Scale: Good     Standing balance support: Bilateral upper extremity supported Standing balance-Leahy Scale: Poor Standing balance comment: external support required to maintain standing balance. heavy realince on rolling walker with dynamic activity                             Pertinent Vitals/Pain Pain Assessment Pain Assessment: Faces Faces  Pain Scale: Hurts little more Pain Location: R knee with movement Pain Descriptors / Indicators: Discomfort Pain Intervention(s): Limited activity within patient's tolerance, Monitored during session, Repositioned (polar care reapplied at end of session)    Home Living  Family/patient expects to be discharged to:: Private residence Living Arrangements: Alone Available Help at Discharge: Family;Available PRN/intermittently Type of Home: House Home Access: Stairs to enter Entrance Stairs-Rails: Right Entrance Stairs-Number of Steps: 2   Home Layout: One level Home Equipment: Agricultural consultant (2 wheels);Rollator (4 wheels);Cane - single point;Shower seat;Grab bars - tub/shower      Prior Function Prior Level of Function : Independent/Modified Independent             Mobility Comments: Mod I with single point cane and often uses furniture for support ADLs Comments: Mod I with shower seat for showering     Extremity/Trunk Assessment   Upper Extremity Assessment Upper Extremity Assessment: Generalized weakness (limited right shoulder ROM at baseline with shoulder pain)    Lower Extremity Assessment Lower Extremity Assessment: RLE deficits/detail;LLE deficits/detail RLE Deficits / Details: difficulty with SLR. patient is able to weight bear without knee buckling. dorsiflexion 5/5 RLE Sensation: decreased light touch (chronic- reported around the toe area) LLE Deficits / Details: generalized weakness LLE Sensation: decreased light touch (chronic- around the toe area)       Communication   Communication Communication: No apparent difficulties    Cognition Arousal: Alert Behavior During Therapy: WFL for tasks assessed/performed   PT - Cognitive impairments: No apparent impairments                         Following commands: Intact       Cueing Cueing Techniques: Verbal cues, Tactile cues     General Comments      Exercises Total Joint Exercises Goniometric ROM: R knee 85 degrees flexion measured in sitting   Assessment/Plan    PT Assessment Patient needs continued PT services  PT Problem List Decreased strength;Decreased activity tolerance;Decreased range of motion;Decreased mobility;Decreased balance;Decreased safety  awareness;Pain       PT Treatment Interventions DME instruction;Gait training;Stair training;Therapeutic activities;Functional mobility training;Balance training;Therapeutic exercise;Neuromuscular re-education;Cognitive remediation;Patient/family education    PT Goals (Current goals can be found in the Care Plan section)  Acute Rehab PT Goals Patient Stated Goal: to go to rehab PT Goal Formulation: With patient Time For Goal Achievement: 06/06/23 Potential to Achieve Goals: Good    Frequency BID     Co-evaluation               AM-PAC PT "6 Clicks" Mobility  Outcome Measure Help needed turning from your back to your side while in a flat bed without using bedrails?: A Little Help needed moving from lying on your back to sitting on the side of a flat bed without using bedrails?: A Little Help needed moving to and from a bed to a chair (including a wheelchair)?: A Little Help needed standing up from a chair using your arms (e.g., wheelchair or bedside chair)?: A Little Help needed to walk in hospital room?: A Little Help needed climbing 3-5 steps with a railing? : A Lot 6 Click Score: 17    End of Session Equipment Utilized During Treatment: Gait belt Activity Tolerance: Patient tolerated treatment well;Patient limited by fatigue Patient left: in chair;with call bell/phone within reach (polar care in place) Nurse Communication: Mobility status PT Visit Diagnosis: Difficulty in walking, not elsewhere classified (R26.2);Other abnormalities of gait and  mobility (R26.89)    Time: 1610-9604 PT Time Calculation (min) (ACUTE ONLY): 35 min   Charges:   PT Evaluation $PT Eval Low Complexity: 1 Low PT Treatments $Gait Training: 8-22 mins PT General Charges $$ ACUTE PT VISIT: 1 Visit         Donna Bernard, PT, MPT   Ina Homes 05/23/2023, 9:21 AM

## 2023-05-23 NOTE — Progress Notes (Signed)
   Subjective: 1 Day Post-Op Procedure(s) (LRB): ARTHROPLASTY, KNEE, TOTAL (Right) Patient reports pain as mild.   Patient is well, and has had no acute complaints or problems Denies any CP, SOB, ABD pain. We will continue therapy today.  Plan is to go Skilled nursing facility after hospital stay.  Objective: Vital signs in last 24 hours: Temp:  [96.9 F (36.1 C)-98.2 F (36.8 C)] 98.1 F (36.7 C) (03/18 0735) Pulse Rate:  [63-94] 83 (03/18 0735) Resp:  [10-28] 20 (03/18 0452) BP: (88-153)/(49-89) 146/64 (03/18 0735) SpO2:  [82 %-100 %] 92 % (03/18 0735) Weight:  [73.9 kg] 73.9 kg (03/17 1202)  Intake/Output from previous day: 03/17 0701 - 03/18 0700 In: 1280 [P.O.:180; I.V.:1000; IV Piggyback:100] Out: 750 [Urine:700; Blood:50] Intake/Output this shift: No intake/output data recorded.  Recent Labs    05/23/23 0629  HGB 10.7*   Recent Labs    05/23/23 0629  WBC 7.4  RBC 3.40*  HCT 32.0*  PLT 207   Recent Labs    05/23/23 0629  NA 137  K 3.4*  CL 105  CO2 27  BUN 20  CREATININE 1.10*  GLUCOSE 121*  CALCIUM 9.1   No results for input(s): "LABPT", "INR" in the last 72 hours.  EXAM General - Patient is Alert, Appropriate, and Oriented Extremity - Neurovascular intact Sensation intact distally Intact pulses distally Dorsiflexion/Plantar flexion intact Dressing - dressing C/D/I and no drainage Motor Function - intact, moving foot and toes well on exam.   Past Medical History:  Diagnosis Date   Adrenal adenoma, left    benign   Anemia    CKD (chronic kidney disease) stage 4, GFR 15-29 ml/min (HCC)    Colitis    Complication of anesthesia    bp dropped during EGD/during hand surgery bp became elevated   GERD (gastroesophageal reflux disease)    Helicobacter pylori gastritis    Hiatal hernia    History of chicken pox    History of Helicobacter pylori infection    History of rectal bleeding    Left renal mass    Low vitamin D level    Neuropathy     Osteoporosis    Presbyesophagus    Primary hypertension    Primary osteoarthritis involving multiple joints    Renal insufficiency    Seasonal allergies    Spinal stenosis of lumbar region    Tinnitus     Assessment/Plan:   1 Day Post-Op Procedure(s) (LRB): ARTHROPLASTY, KNEE, TOTAL (Right) Principal Problem:   S/P total knee arthroplasty, right  Estimated body mass index is 28.87 kg/m as calculated from the following:   Height as of this encounter: 5\' 3"  (1.6 m).   Weight as of this encounter: 73.9 kg. Advance diet Up with therapy Pain well controlled Hypokalemia - K 3.4, give oral K supplement. Recheck K tomorrow Acute post op blood loss anemia  - Hgb 10.7, stable. Recheck Hgb in the am CM to assist with discharge to SNF    DVT Prophylaxis - Lovenox, TED hose, and SCDs Weight-Bearing as tolerated to right leg   T. Cranston Neighbor, PA-C [facility] 05/23/2023, 8:05 AM

## 2023-05-23 NOTE — TOC Initial Note (Signed)
 Transition of Care [facility]) - Initial/Assessment Note    Patient Details  Name: Teresa Johns MRN: [id number] Date of Birth: [birth year]  Transition of Care [facility]) CM/SW Contact:    Marlowe Sax, RN Phone Number: 05/23/2023, 4:32 PM  Clinical Narrative:                  The patient would like to go to STR prior to going  home  PASSR obtained, Fl2 completed, Bedsearch sent, will need Ins approval     Patient Goals and CMS Choice            Expected Discharge Plan and Services                                              Prior Living Arrangements/Services                       Activities of Daily Living   ADL Screening (condition at time of admission) Independently performs ADLs?: Yes (appropriate for developmental age) Is the patient deaf or have difficulty hearing?: No Does the patient have difficulty seeing, even when wearing glasses/contacts?: No Does the patient have difficulty concentrating, remembering, or making decisions?: No  Permission Sought/Granted                  Emotional Assessment              Admission diagnosis:  S/P total knee arthroplasty, right [Z96.651] Patient Active Problem List   Diagnosis Date Noted   S/P total knee arthroplasty, right 05/22/2023   Lumbar stenosis 01/25/2023   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   Kidney mass 01/17/2023   Hypertension 09/06/2019   Osteoporosis 07/04/2019   Seasonal allergies 07/04/2019   Low vitamin D level 01/01/2014   PCP:  Barbette Reichmann, MD Pharmacy:   [facility] - [place], [place] - [address] [place] [address] Phone: [phone] Fax: [phone]     Social Drivers of Health (SDOH) Social History: SDOH Screenings   Food Insecurity: No Food Insecurity (05/22/2023)  Housing: Low Risk  (05/22/2023)  Transportation Needs: No Transportation Needs (05/22/2023)  Utilities: Not At Risk  (05/22/2023)  Depression (PHQ2-9): Low Risk  (01/17/2023)  Financial Resource Strain: Low Risk  (05/18/2023)   Received from [facility]  Social Connections: Socially Isolated (05/22/2023)  Tobacco Use: Low Risk  (05/22/2023)   SDOH Interventions:     Readmission Risk Interventions     No data to display

## 2023-05-23 NOTE — Progress Notes (Signed)
 Physical Therapy Treatment Patient Details Name: Teresa Johns MRN: [id number] DOB: Mar 03, 1935 Today's Date: 05/23/2023   History of Present Illness Patient is a 89 year old with right knee osteoarthritis. S/p right total knee arthroplasty. History of back surgery    PT Comments  Patient reports feeling worsening pain in the right knee as well as mild nausea. She declined getting out of bed at this time. Assistance required for bed pan placement for urination as patient declined getting to the bed side commode due to pain. Encouraged patient to elevated head of bed while feeling nausea. She participated with LE therapeutic exercises for strengthening. Continue to anticipate the need for rehabilitation < 3 hours/day after this hospital stay.     If plan is discharge home, recommend the following: A lot of help with walking and/or transfers;A lot of help with bathing/dressing/bathroom;Assist for transportation;Help with stairs or ramp for entrance;Assistance with cooking/housework   Can travel by private vehicle     Yes  Equipment Recommendations  None recommended by PT    Recommendations for Other Services       Precautions / Restrictions Precautions Precautions: Fall;Knee Precaution Booklet Issued: Yes (comment) Recall of Precautions/Restrictions: Intact Restrictions Weight Bearing Restrictions Per Provider Order: Yes RLE Weight Bearing Per Provider Order: Weight bearing as tolerated     Mobility  Bed Mobility Overal bed mobility: Needs Assistance Bed Mobility: Rolling     Supine to sit: Contact guard     General bed mobility comments: patient able to roll and bridge in bed for bed pan placement with CGA. She declined getting out of bed or using bed side commode for urination due to pain and nausea. encouraged patient to elevated HOB if feeling nauseous due to aspiration risk if vomiting. patient spit up in emesis bag x 1 bout.    Transfers Overall transfer level: Needs  assistance Equipment used: Rolling walker (2 wheels) Transfers: Sit to/from Stand Sit to Stand: Min assist           General transfer comment: lifting assistance required for standing. cues for safe hand placement with standing and RLE positioning with sitting    Ambulation/Gait Ambulation/Gait assistance: Min assist Gait Distance (Feet): 25 Feet Assistive device: Rolling walker (2 wheels) Gait Pattern/deviations: Step-to pattern, Decreased stride length, Decreased stance time - right, Decreased dorsiflexion - right Gait velocity: decreased     General Gait Details: several standing rest breaks required with short distance ambulation secondary to shoulder pain/fatigue and right leg pain. cues for sequencing of rolling walker and BLE. she is currently unable to walk a household distance   Optometrist     Tilt Bed    Modified Rankin (Stroke Patients Only)       Balance Overall balance assessment: Needs assistance Sitting-balance support: Feet supported Sitting balance-Leahy Scale: Good     Standing balance support: Bilateral upper extremity supported Standing balance-Leahy Scale: Poor Standing balance comment: external support required to maintain standing balance. heavy realince on rolling walker with dynamic activity                            Communication Communication Communication: No apparent difficulties  Cognition Arousal: Alert Behavior During Therapy: WFL for tasks assessed/performed   PT - Cognitive impairments: No apparent impairments  Following commands: Intact      Cueing Cueing Techniques: Verbal cues, Tactile cues  Exercises Total Joint Exercises Ankle Circles/Pumps: AROM, Strengthening, Right, 10 reps, Supine Quad Sets: AROM, Strengthening, Right, 5 reps, Supine Hip ABduction/ADduction: AAROM, Strengthening, Right, 10 reps, Supine Straight Leg Raises: AAROM,  Strengthening, Right, 5 reps, Supine Goniometric ROM: R knee 85 degrees flexion measured in sitting Other Exercises Other Exercises: cues for exercise technique for strengthening.    General Comments General comments (skin integrity, edema, etc.): patient requested to reposition right leg in hopes for less pain. she participated with therapeutic exercises for strengthening of RLE. she was able to complete pericare after urination in bed pan      Pertinent Vitals/Pain Pain Assessment Pain Assessment: Faces Faces Pain Scale: Hurts whole lot Pain Location: R knee Pain Descriptors / Indicators: Discomfort Pain Intervention(s): Limited activity within patient's tolerance, Monitored during session, Repositioned, Patient requesting pain meds-RN notified (polar care in place)    Home Living                          Prior Function            PT Goals (current goals can now be found in the care plan section) Acute Rehab PT Goals Patient Stated Goal: to go to rehab PT Goal Formulation: With patient Time For Goal Achievement: 06/06/23 Potential to Achieve Goals: Good Progress towards PT goals: Progressing toward goals    Frequency    BID      PT Plan      Co-evaluation              AM-PAC PT "6 Clicks" Mobility   Outcome Measure  Help needed turning from your back to your side while in a flat bed without using bedrails?: A Little Help needed moving from lying on your back to sitting on the side of a flat bed without using bedrails?: A Little Help needed moving to and from a bed to a chair (including a wheelchair)?: A Little Help needed standing up from a chair using your arms (e.g., wheelchair or bedside chair)?: A Little Help needed to walk in hospital room?: A Little Help needed climbing 3-5 steps with a railing? : A Lot 6 Click Score: 17    End of Session   Activity Tolerance: Patient limited by pain Patient left: in bed;with call bell/phone within  reach (polar care in place) Nurse Communication: Mobility status;Patient requests pain meds (pain, nausea) PT Visit Diagnosis: Difficulty in walking, not elsewhere classified (R26.2);Other abnormalities of gait and mobility (R26.89)     Time: 1478-2956 PT Time Calculation (min) (ACUTE ONLY): 21 min  Charges:    $Therapeutic Exercise: 8-22 mins PT General Charges $$ ACUTE PT VISIT: 1 Visit                     Donna Bernard, PT, MPT    Ina Homes 05/23/2023, 1:15 PM

## 2023-05-24 DIAGNOSIS — N184 Chronic kidney disease, stage 4 (severe): Secondary | ICD-10-CM | POA: Diagnosis not present

## 2023-05-24 DIAGNOSIS — Z90710 Acquired absence of both cervix and uterus: Secondary | ICD-10-CM | POA: Diagnosis not present

## 2023-05-24 DIAGNOSIS — M810 Age-related osteoporosis without current pathological fracture: Secondary | ICD-10-CM | POA: Diagnosis not present

## 2023-05-24 DIAGNOSIS — D62 Acute posthemorrhagic anemia: Secondary | ICD-10-CM | POA: Diagnosis not present

## 2023-05-24 DIAGNOSIS — Z833 Family history of diabetes mellitus: Secondary | ICD-10-CM | POA: Diagnosis not present

## 2023-05-24 DIAGNOSIS — Z96651 Presence of right artificial knee joint: Secondary | ICD-10-CM

## 2023-05-24 DIAGNOSIS — M1711 Unilateral primary osteoarthritis, right knee: Secondary | ICD-10-CM | POA: Diagnosis not present

## 2023-05-24 DIAGNOSIS — E876 Hypokalemia: Secondary | ICD-10-CM | POA: Diagnosis not present

## 2023-05-24 DIAGNOSIS — Z807 Family history of other malignant neoplasms of lymphoid, hematopoietic and related tissues: Secondary | ICD-10-CM | POA: Diagnosis not present

## 2023-05-24 DIAGNOSIS — Z803 Family history of malignant neoplasm of breast: Secondary | ICD-10-CM | POA: Diagnosis not present

## 2023-05-24 DIAGNOSIS — I129 Hypertensive chronic kidney disease with stage 1 through stage 4 chronic kidney disease, or unspecified chronic kidney disease: Secondary | ICD-10-CM | POA: Diagnosis not present

## 2023-05-24 DIAGNOSIS — Z8249 Family history of ischemic heart disease and other diseases of the circulatory system: Secondary | ICD-10-CM | POA: Diagnosis not present

## 2023-05-24 DIAGNOSIS — Z8619 Personal history of other infectious and parasitic diseases: Secondary | ICD-10-CM | POA: Diagnosis not present

## 2023-05-24 DIAGNOSIS — Z823 Family history of stroke: Secondary | ICD-10-CM | POA: Diagnosis not present

## 2023-05-24 DIAGNOSIS — Z7982 Long term (current) use of aspirin: Secondary | ICD-10-CM | POA: Diagnosis not present

## 2023-05-24 LAB — CBC
HCT: 32.4 % — ABNORMAL LOW (ref 36.0–46.0)
Hemoglobin: 11 g/dL — ABNORMAL LOW (ref 12.0–15.0)
MCH: 30.9 pg (ref 26.0–34.0)
MCHC: 34 g/dL (ref 30.0–36.0)
MCV: 91 fL (ref 80.0–100.0)
Platelets: 207 10*3/uL (ref 150–400)
RBC: 3.56 MIL/uL — ABNORMAL LOW (ref 3.87–5.11)
RDW: 13.2 % (ref 11.5–15.5)
WBC: 9.6 10*3/uL (ref 4.0–10.5)
nRBC: 0 % (ref 0.0–0.2)

## 2023-05-24 MED ORDER — MORPHINE SULFATE (PF) 2 MG/ML IV SOLN
INTRAVENOUS | Status: AC
  Filled 2023-05-24: qty 1

## 2023-05-24 MED ORDER — DOCUSATE SODIUM 100 MG PO CAPS: 100.0000 mg | ORAL_CAPSULE | Freq: Two times a day (BID) | ORAL | 0 refills | Status: AC

## 2023-05-24 MED ORDER — LOSARTAN POTASSIUM 50 MG PO TABS
ORAL_TABLET | ORAL | Status: AC
  Filled 2023-05-24: qty 1

## 2023-05-24 MED ORDER — MAGNESIUM HYDROXIDE 400 MG/5ML PO SUSP: 30.0000 mL | Freq: Every day | ORAL | Status: DC | PRN

## 2023-05-24 MED ORDER — HYDROCODONE-ACETAMINOPHEN 5-325 MG PO TABS
ORAL_TABLET | ORAL | Status: AC
  Filled 2023-05-24: qty 2

## 2023-05-24 MED ORDER — ENOXAPARIN SODIUM 30 MG/0.3ML IJ SOSY: 30.0000 mg | PREFILLED_SYRINGE | INTRAMUSCULAR | Status: DC

## 2023-05-24 MED ORDER — ONDANSETRON HCL 4 MG PO TABS
ORAL_TABLET | ORAL | Status: AC
  Filled 2023-05-24: qty 1

## 2023-05-24 MED ORDER — ONDANSETRON HCL 4 MG PO TABS: 4.0000 mg | ORAL_TABLET | Freq: Four times a day (QID) | ORAL | 0 refills | Status: AC | PRN

## 2023-05-24 MED ORDER — OXYCODONE HCL 5 MG PO TABS: 2.5000 mg | ORAL_TABLET | Freq: Three times a day (TID) | ORAL | 0 refills | Status: AC | PRN

## 2023-05-24 MED ORDER — BISACODYL 10 MG RE SUPP: 10.0000 mg | Freq: Every day | RECTAL | Status: DC | PRN

## 2023-05-24 MED ORDER — TRIAMTERENE-HCTZ 37.5-25 MG PO TABS
ORAL_TABLET | ORAL | Status: AC
  Filled 2023-05-24: qty 1

## 2023-05-24 MED ORDER — ACETAMINOPHEN 500 MG PO TABS: 1000.0000 mg | ORAL_TABLET | Freq: Three times a day (TID) | ORAL | Status: AC

## 2023-05-24 MED ORDER — TRAMADOL HCL 50 MG PO TABS: 50.0000 mg | ORAL_TABLET | Freq: Four times a day (QID) | ORAL | 0 refills | Status: AC | PRN

## 2023-05-24 NOTE — Anesthesia Postprocedure Evaluation (Signed)
 Anesthesia Post Note  Patient: Teresa Johns  Procedure(s) Performed: ARTHROPLASTY, KNEE, TOTAL (Right: Knee)  Patient location during evaluation: Nursing Unit Anesthesia Type: Spinal Level of consciousness: awake and alert and oriented Pain management: pain level controlled Vital Signs Assessment: post-procedure vital signs reviewed and stable Respiratory status: respiratory function stable Cardiovascular status: stable Postop Assessment: no headache, patient able to bend at knees, able to ambulate and no apparent nausea or vomiting Anesthetic complications: no   No notable events documented.   Last Vitals:  Vitals:   05/24/23 0645 05/24/23 0747  BP: (!) 145/66 133/65  Pulse: 87 79  Resp: 14 15  Temp: 36.8 C 36.7 C  SpO2: 94% 93%    Last Pain:  Vitals:   05/24/23 0747  TempSrc: Oral  PainSc:                  Zachary George

## 2023-05-24 NOTE — TOC Progression Note (Signed)
 Transition of Care [facility]) - Progression Note    Patient Details  Name: Teresa Johns MRN: [id number] Date of Birth: [birth year]  [facility]) CM/SW Contact  Marlowe Sax, RN Phone Number: 05/24/2023, 4:35 PM  Clinical Narrative:     Met with the patient in the room, I explained that [facility] is not in network with her Ins, I explained at this time we only have a bed offer from [place] [facility].  She does not want to go to [facility] and was wondering if there is anything else, I resent the bedsearch and expanded the area   I spoke with Tammy Sours, her son I explained that [facility] is not in network with her insurance, they called her Ins rep and he told them to call her Ins company and ask for an exception to be expedited,  They will call the Ins company and ask for an exception and let me know     Expected Discharge Plan and Services                                               Social Determinants of Health (SDOH) Interventions SDOH Screenings   Food Insecurity: No Food Insecurity (05/22/2023)  Housing: Low Risk  (05/22/2023)  Transportation Needs: No Transportation Needs (05/22/2023)  Utilities: Not At Risk (05/22/2023)  Depression (PHQ2-9): Low Risk  (01/17/2023)  Financial Resource Strain: Low Risk  (05/18/2023)   Received from [facility]  Social Connections: Socially Isolated (05/22/2023)  Tobacco Use: Low Risk  (05/22/2023)    Readmission Risk Interventions     No data to display

## 2023-05-24 NOTE — Progress Notes (Signed)
 Physical Therapy Treatment Patient Details Name: Teresa Johns MRN: [id number] DOB: 1934/11/29 Today's Date: 05/24/2023   History of Present Illness Patient is a 89 year old with right knee osteoarthritis. S/p right total knee arthroplasty. History of back surgery    PT Comments  Pt was long sitting in recliner upon arrival. She is more alert and does not endorse having any nausea like earlier. Pt still needs constant encouragement but does put forth great effort throughout session. She tolerated standing ~ 4 x total throughout session form recliner to RW. Pt tolerated ambulation 2 x 30 ft but continues to have extremely slow, antalgic, step to gait pattern with poor wt acceptance on RLE. Author issued HEP and pt performed. Educated both pt/pt's son on importance of positioning, polar care use, stretching, and current DC recs. Both state understanding. Acute PT will continue to follow and progress as able per current POC. DC recs remain appropriate.    If plan is discharge home, recommend the following: A lot of help with walking and/or transfers;A lot of help with bathing/dressing/bathroom;Assistance with cooking/housework;Direct supervision/assist for medications management;Direct supervision/assist for financial management;Assist for transportation;Help with stairs or ramp for entrance;Supervision due to cognitive status     Equipment Recommendations  Other (comment) (Defer to next level of care)       Precautions / Restrictions Precautions Precautions: Fall;Knee Precaution Booklet Issued: Yes (comment) Recall of Precautions/Restrictions: Impaired Restrictions Weight Bearing Restrictions Per Provider Order: Yes RLE Weight Bearing Per Provider Order: Weight bearing as tolerated     Mobility  Bed Mobility Overal bed mobility: Needs Assistance Bed Mobility: Sit to Supine  Sit to supine: Min assist, Mod assist General bed mobility comments: in recliner pre/post session     Transfers Overall transfer level: Needs assistance Equipment used: Rolling walker (2 wheels) Transfers: Sit to/from Stand Sit to Stand: Min assist  General transfer comment: min assist + increased time + vcs for technique. pt perform STS from recliner 4 x throughout session.    Ambulation/Gait Ambulation/Gait assistance: Min assist, Contact guard assist Gait Distance (Feet): 30 Feet Assistive device: Rolling walker (2 wheels) Gait Pattern/deviations: Step-to pattern, Antalgic, Trunk flexed, Decreased stance time - right, Decreased step length - right, Decreased step length - left Gait velocity: decreased  General Gait Details: pt continues to have severely slow, antalgic, cautious gait kinematic. did ambulated ~ 2 x 30 ft with recliner follow. Pt takes ~ 10 minutes to go 81ft with step by step sequencing and max encouragement throughout. Chair follow throughout   Balance Overall balance assessment: Needs assistance Sitting-balance support: Feet supported Sitting balance-Leahy Scale: Good     Standing balance support: Bilateral upper extremity supported, During functional activity, Reliant on assistive device for balance Standing balance-Leahy Scale: Fair Standing balance comment: very reliant on BUE support during all dynamic standing activity    Communication Communication Communication: No apparent difficulties  Cognition Arousal: Alert Behavior During Therapy: WFL for tasks assessed/performed   PT - Cognitive impairments: No apparent impairments   PT - Cognition Comments: Pt is alert and much more appropriate to participate versus early this morning. Pt does still need constant encouragement to Following commands: Intact      Cueing Cueing Techniques: Verbal cues, Tactile cues, Visual cues  Exercises Total Joint Exercises Goniometric ROM: 4-78    General Comments General comments (skin integrity, edema, etc.): Chartered loss adjuster issued HEP handout and pt perform ~ 1/2 of them.  Educated both pt and pt's son on positioning, importance of polar care  use, and need to perform HEP/stretching to maximize her independnece and safety with all ADLs.      Pertinent Vitals/Pain Pain Assessment Pain Assessment: 0-10 Pain Score: 6  Pain Location: R knee Pain Descriptors / Indicators: Discomfort Pain Intervention(s): Limited activity within patient's tolerance, Monitored during session, Premedicated before session, Repositioned, Ice applied     PT Goals (current goals can now be found in the care plan section) Acute Rehab PT Goals Patient Stated Goal: get better so I can go home ASAP Progress towards PT goals: Progressing toward goals    Frequency    BID       AM-PAC PT "6 Clicks" Mobility   Outcome Measure  Help needed turning from your back to your side while in a flat bed without using bedrails?: A Little Help needed moving from lying on your back to sitting on the side of a flat bed without using bedrails?: A Little Help needed moving to and from a bed to a chair (including a wheelchair)?: A Lot Help needed standing up from a chair using your arms (e.g., wheelchair or bedside chair)?: A Lot Help needed to walk in hospital room?: A Lot Help needed climbing 3-5 steps with a railing? : A Lot 6 Click Score: 14    End of Session   Activity Tolerance: Patient tolerated treatment well;Patient limited by pain Patient left: in chair;with call bell/phone within reach;with chair alarm set;with family/visitor present Nurse Communication: Mobility status PT Visit Diagnosis: Difficulty in walking, not elsewhere classified (R26.2);Other abnormalities of gait and mobility (R26.89)     Time: 1430-1459 PT Time Calculation (min) (ACUTE ONLY): 29 min  Charges:    $Gait Training: 8-22 mins $Therapeutic Exercise: 8-22 mins $Therapeutic Activity: 8-22 mins PT General Charges $$ ACUTE PT VISIT: 1 Visit                    Jetta Lout PTA 05/24/23, 3:16 PM

## 2023-05-24 NOTE — Discharge Instructions (Addendum)
 Instructions after Total Knee Replacement   Reinaldo Berber M.D.     Dept. of Orthopaedics & Sports Medicine  [facility]  [address]  [place], [place]  [address]  Phone: [phone]   Fax: [phone]    DIET: Drink plenty of non-alcoholic fluids. Resume your normal diet. Include foods high in fiber.  ACTIVITY:  You may use crutches or a walker with weight-bearing as tolerated, unless instructed otherwise. You may be weaned off of the walker or crutches by your Physical Therapist.  Do NOT place pillows under the knee. Anything placed under the knee could limit your ability to straighten the knee.   Continue doing gentle exercises. Exercising will reduce the pain and swelling, increase motion, and prevent muscle weakness.   Please continue to use the TED compression stockings for 2 weeks. You may remove the stockings at night, but should reapply them in the morning. Do not drive or operate any equipment until instructed.  WOUND CARE:  Continue to use the PolarCare or ice packs periodically to reduce pain and swelling. You may begin showering 3 days after surgery with honeycomb dressing. Remove honeycomb dressing 7 days after surgery and continue showering. Allow dermabond to fall off on its own.  MEDICATIONS: You may resume your regular medications. Please take the pain medication as prescribed on the medication. Do not take pain medication on an empty stomach. You have been given a prescription for a blood thinner (Lovenox or Coumadin). Please take the medication as instructed. (NOTE: After completing a 2 week course of Lovenox, take one 81 mg Enteric-coated aspirin twice a day for 3 additional weeks. This along with elevation will help reduce the possibility of phlebitis in your operated leg.) Do not drive or drink alcoholic beverages when taking pain medications.  POSTOPERATIVE CONSTIPATION PROTOCOL Constipation - defined medically as fewer than three stools per  week and severe constipation as less than one stool per week.  One of the most common issues patients have following surgery is constipation.  Even if you have a regular bowel pattern at home, your normal regimen is likely to be disrupted due to multiple reasons following surgery.  Combination of anesthesia, postoperative narcotics, change in appetite and fluid intake all can affect your bowels.  In order to avoid complications following surgery, here are some recommendations in order to help you during your recovery period.  Colace (docusate) - Pick up an over-the-counter form of Colace or another stool softener and take twice a day as long as you are requiring postoperative pain medications.  Take with a full glass of water daily.  If you experience loose stools or diarrhea, hold the colace until you stool forms back up.  If your symptoms do not get better within 1 week or if they get worse, check with your doctor.  Dulcolax (bisacodyl) - Pick up over-the-counter and take as directed by the product packaging as needed to assist with the movement of your bowels.  Take with a full glass of water.  Use this product as needed if not relieved by Colace only.   MiraLax (polyethylene glycol) - Pick up over-the-counter to have on hand.  MiraLax is a solution that will increase the amount of water in your bowels to assist with bowel movements.  Take as directed and can mix with a glass of water, juice, soda, coffee, or tea.  Take if you go more than two days without a movement. Do not use MiraLax more than once per day.  Call your doctor if you are still constipated or irregular after using this medication for 7 days in a row.  If you continue to have problems with postoperative constipation, please contact the office for further assistance and recommendations.  If you experience "the worst abdominal pain ever" or develop nausea or vomiting, please contact the office immediatly for further recommendations for  treatment.   CALL THE OFFICE FOR: Temperature above 101 degrees Excessive bleeding or drainage on the dressing. Excessive swelling, coldness, or paleness of the toes. Persistent nausea and vomiting.  FOLLOW-UP:  You should have an appointment to return to the office in 14 days after surgery. Arrangements have been made for continuation of Physical Therapy (either home therapy or outpatient therapy).   [facility] They will call you to set up when they are coming out to see you   [address], [place], [place] [address] Hours:  Open ? Closes 5?PM Phone: [phone]

## 2023-05-24 NOTE — Progress Notes (Signed)
   Subjective: 2 Days Post-Op Procedure(s) (LRB): ARTHROPLASTY, KNEE, TOTAL (Right) Patient reports pain as mild.   Patient is well, and has had no acute complaints or problems Denies any CP, SOB, ABD pain. We will continue therapy today.  Plan is to go Skilled nursing facility after hospital stay.  Objective: Vital signs in last 24 hours: Temp:  [98 F (36.7 C)-98.2 F (36.8 C)] 98 F (36.7 C) (03/19 0747) Pulse Rate:  [79-92] 79 (03/19 0747) Resp:  [14-18] 15 (03/19 0747) BP: (133-167)/(62-72) 133/65 (03/19 0747) SpO2:  [93 %-95 %] 93 % (03/19 0747)  Intake/Output from previous day: 03/18 0701 - 03/19 0700 In: -  Out: 200 [Urine:200] Intake/Output this shift: No intake/output data recorded.  Recent Labs    05/23/23 0629 05/24/23 0621  HGB 10.7* 11.0*   Recent Labs    05/23/23 0629 05/24/23 0621  WBC 7.4 9.6  RBC 3.40* 3.56*  HCT 32.0* 32.4*  PLT 207 207   Recent Labs    05/23/23 0629  NA 137  K 3.4*  CL 105  CO2 27  BUN 20  CREATININE 1.10*  GLUCOSE 121*  CALCIUM 9.1   No results for input(s): "LABPT", "INR" in the last 72 hours.  EXAM General - Patient is Alert, Appropriate, and Oriented Extremity - Neurovascular intact Sensation intact distally Intact pulses distally Dorsiflexion/Plantar flexion intact Dressing - dressing C/D/I and no drainage Motor Function - intact, moving foot and toes well on exam.   Past Medical History:  Diagnosis Date   Adrenal adenoma, left    benign   Anemia    CKD (chronic kidney disease) stage 4, GFR 15-29 ml/min (HCC)    Colitis    Complication of anesthesia    bp dropped during EGD/during hand surgery bp became elevated   GERD (gastroesophageal reflux disease)    Helicobacter pylori gastritis    Hiatal hernia    History of chicken pox    History of Helicobacter pylori infection    History of rectal bleeding    Left renal mass    Low vitamin D level    Neuropathy    Osteoporosis    Presbyesophagus     Primary hypertension    Primary osteoarthritis involving multiple joints    Renal insufficiency    Seasonal allergies    Spinal stenosis of lumbar region    Tinnitus     Assessment/Plan:   2 Days Post-Op Procedure(s) (LRB): ARTHROPLASTY, KNEE, TOTAL (Right) Principal Problem:   S/P total knee arthroplasty, right  Estimated body mass index is 28.87 kg/m as calculated from the following:   Height as of this encounter: 5\' 3"  (1.6 m).   Weight as of this encounter: 73.9 kg. Advance diet Up with therapy Pain well controlled Acute post op blood loss anemia  - Hgb 11.0, stable, trending up.  CM to assist with discharge to SNF    DVT Prophylaxis - Lovenox, TED hose, and SCDs Weight-Bearing as tolerated to right leg   T. Cranston Neighbor, PA-C [facility] 05/24/2023, 8:20 AM

## 2023-05-24 NOTE — Progress Notes (Signed)
 Physical Therapy Treatment Patient Details Name: Teresa Johns MRN: [id number] DOB: 04/14/1934 Today's Date: 05/24/2023   History of Present Illness Patient is a 89 year old with right knee osteoarthritis. S/p right total knee arthroplasty. History of back surgery    PT Comments  Pt was long sitting in recliner upon arrival. She had refused earlier session due to pain. She was medicated for pain prior to author returning for this session. Pt now c/o feeling nauseated. Max encouragement to participate. Once agreeable, pt stood from recliner to RW and then started to dry heave. Emesis bag issued. Did not actually vomit but does demand returning to bed due to feeling so poorly. Pt is much more lethargic than earlier session attempt. Author feels nausea and lethargy is due to pain meds previously given. Pt will continue to benefit from skilled PT at DC to maximize independence and safety with all ADLs. DC recs remain appropriate to maximize her independence and safety with all ADLs.    If plan is discharge home, recommend the following: A lot of help with walking and/or transfers;A lot of help with bathing/dressing/bathroom;Assistance with cooking/housework;Direct supervision/assist for medications management;Direct supervision/assist for financial management;Assist for transportation;Help with stairs or ramp for entrance;Supervision due to cognitive status     Equipment Recommendations  Other (comment) (Defer to next level of care)       Precautions / Restrictions Precautions Precautions: Fall;Knee Precaution Booklet Issued: Yes (comment) Recall of Precautions/Restrictions: Impaired (cognition poor) Restrictions Weight Bearing Restrictions Per Provider Order: Yes RLE Weight Bearing Per Provider Order: Weight bearing as tolerated     Mobility  Bed Mobility Overal bed mobility: Needs Assistance Bed Mobility: Sit to Supine  Sit to supine: Min assist, Mod assist General bed mobility comments:  pt required min-mod assist of one + use of non operative LE to reposition in bed from EOB sitting    Transfers Overall transfer level: Needs assistance Equipment used: Rolling walker (2 wheels) Transfers: Sit to/from Stand Sit to Stand: Min assist  General transfer comment: Pt was able to stand from recliner to RW with min assist + extensive time and vcing. pt begins to dry heave upon standing. emesis bag + cold washcloth issued.    Ambulation/Gait Ambulation/Gait assistance: Min assist Gait Distance (Feet): 3 Feet Assistive device: Rolling walker (2 wheels) Gait Pattern/deviations: Step-to pattern, Antalgic, Trunk flexed, Decreased stance time - right, Decreased stride length, Decreased step length - left Gait velocity: decreased  General Gait Details: Pt was able to ambulate 3 ft with RW + step to antalgic sequencing. Gait distance limited by pt dry heaving/nausea   Balance Overall balance assessment: Needs assistance Sitting-balance support: Feet supported Sitting balance-Leahy Scale: Good     Standing balance support: Bilateral upper extremity supported, During functional activity, Reliant on assistive device for balance Standing balance-Leahy Scale: Fair Standing balance comment: limited standing activity/ tolerance due to nausea      Communication Communication Communication: No apparent difficulties  Cognition Arousal: Lethargic, Suspect due to medications Behavior During Therapy: Flat affect   PT - Cognitive impairments: No family/caregiver present to determine baseline   PT - Cognition Comments: Pt is alert however only oriented x 2. Very limited session due to lethargy/vomiting Following commands: Intact      Cueing Cueing Techniques: Verbal cues, Tactile cues, Visual cues         Pertinent Vitals/Pain Pain Assessment Pain Assessment: 0-10 Pain Score: 7  Pain Location: R knee Pain Descriptors / Indicators: Discomfort Pain Intervention(s): Limited activity  within patient's tolerance, Monitored during session, Premedicated before session, Repositioned, Ice applied     PT Goals (current goals can now be found in the care plan section) Acute Rehab PT Goals Patient Stated Goal: none stated Progress towards PT goals: Progressing toward goals    Frequency    BID       AM-PAC PT "6 Clicks" Mobility   Outcome Measure  Help needed turning from your back to your side while in a flat bed without using bedrails?: A Little Help needed moving from lying on your back to sitting on the side of a flat bed without using bedrails?: A Little Help needed moving to and from a bed to a chair (including a wheelchair)?: A Lot Help needed standing up from a chair using your arms (e.g., wheelchair or bedside chair)?: A Lot Help needed to walk in hospital room?: A Little Help needed climbing 3-5 steps with a railing? : A Lot 6 Click Score: 15    End of Session   Activity Tolerance: Patient limited by pain;Other (comment) (limited by nausea) Patient left: in bed;with call bell/phone within reach Nurse Communication: Mobility status PT Visit Diagnosis: Difficulty in walking, not elsewhere classified (R26.2);Other abnormalities of gait and mobility (R26.89)     Time: 1610-9604 PT Time Calculation (min) (ACUTE ONLY): 14 min  Charges:    $Therapeutic Activity: 8-22 mins PT General Charges $$ ACUTE PT VISIT: 1 Visit                    Jetta Lout PTA 05/24/23, 11:49 AM

## 2023-05-24 NOTE — Plan of Care (Signed)

## 2023-05-24 NOTE — TOC Progression Note (Signed)
 Transition of Care [facility]) - Progression Note    Patient Details  Name: Teresa Johns MRN: [id number] Date of Birth: [birth year]  [facility]) CM/SW Contact  Marlowe Sax, RN Phone Number: 05/24/2023, 2:01 PM  Clinical Narrative:     No bed offers yet       Expected Discharge Plan and Services                                               Social Determinants of Health (SDOH) Interventions SDOH Screenings   Food Insecurity: No Food Insecurity (05/22/2023)  Housing: Low Risk  (05/22/2023)  Transportation Needs: No Transportation Needs (05/22/2023)  Utilities: Not At Risk (05/22/2023)  Depression (PHQ2-9): Low Risk  (01/17/2023)  Financial Resource Strain: Low Risk  (05/18/2023)   Received from [facility]  Social Connections: Socially Isolated (05/22/2023)  Tobacco Use: Low Risk  (05/22/2023)    Readmission Risk Interventions     No data to display

## 2023-05-25 MED ORDER — BISACODYL 10 MG RE SUPP: 10.0000 mg | Freq: Every day | RECTAL | Status: AC | PRN

## 2023-05-25 NOTE — Discharge Summary (Addendum)
 Physician Discharge Summary  Patient ID: Teresa Johns MRN: [id number] DOB/AGE: 87-06-1934 87 y.o.  Admit date: 05/22/2023 Discharge date:06/05/2023  Admission Diagnoses:  S/P total knee arthroplasty, right [Z96.651] Status post total knee replacement using cement, right [Z96.651]   Discharge Diagnoses: Patient Active Problem List   Diagnosis Date Noted   Status post total knee replacement using cement, right 05/24/2023   S/P total knee arthroplasty, right 05/22/2023   Lumbar stenosis 01/25/2023   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   Kidney mass 01/17/2023   Hypertension 09/06/2019   Osteoporosis 07/04/2019   Seasonal allergies 07/04/2019   Low vitamin D level 01/01/2014    Past Medical History:  Diagnosis Date   Adrenal adenoma, left    benign   Anemia    CKD (chronic kidney disease) stage 4, GFR 15-29 ml/min (HCC)    Colitis    Complication of anesthesia    bp dropped during EGD/during hand surgery bp became elevated   GERD (gastroesophageal reflux disease)    Helicobacter pylori gastritis    Hiatal hernia    History of chicken pox    History of Helicobacter pylori infection    History of rectal bleeding    Left renal mass    Low vitamin D level    Neuropathy    Osteoporosis    Presbyesophagus    Primary hypertension    Primary osteoarthritis involving multiple joints    Renal insufficiency    Seasonal allergies    Spinal stenosis of lumbar region    Tinnitus      Transfusion: None   Consultants (if any):   Discharged Condition: Improved  Hospital Course: CHARICE ZUNO is an 89 y.o. female who was admitted 05/22/2023 with a diagnosis of S/P total knee arthroplasty, right and went to the operating room on 05/22/2023 and underwent the above named procedures.    Surgeries: Procedure(s): ARTHROPLASTY, KNEE, TOTAL on 05/22/2023 Patient tolerated the surgery well. Taken to PACU where she was stabilized and then transferred to the  orthopedic floor.  Started on Lovenox 30 mg q 24 hrs. TEDs and SCDs applied bilaterally. Heels elevated on bed. No evidence of DVT. Negative Homan. Physical therapy started on day #1 for gait training and transfer. OT started day #1 for ADL and assisted devices.  Patient's IV was d/c on day #1.  Slow progress with physical therapy. Patient with no assistance at home. Pending SNF placement POD 2-14.  Patient medically stable and ready for discharge to SNF postop day 2 through 14. Insurance denied SNF. Patient stable for dc to home with HHPT  On post op day #14 patient was stable and ready for discharge to home with HHPT  Implants: Femur: Persona Size 8 CR   Tibia: Persona Size E w/ 14x30mm stem extension  Poly: 13mm MC  Patella: 35x82mm symmetric   She was given perioperative antibiotics:  Anti-infectives (From admission, onward)    Start     Dose/Rate Route Frequency Ordered Stop   05/22/23 1900  ceFAZolin (ANCEF) IVPB 2g/100 mL premix        2 g 200 mL/hr over 30 Minutes Intravenous Every 6 hours 05/22/23 1714 05/23/23 0031   05/22/23 0600  ceFAZolin (ANCEF) IVPB 2g/100 mL premix        2 g 200 mL/hr over 30 Minutes Intravenous On call to O.R. 05/21/23 2157 05/22/23 1323     .  She was given sequential compression devices, early ambulation, and Lovenox, teds for DVT  prophylaxis.  She benefited maximally from the hospital stay and there were no complications.    Recent vital signs:  Vitals:   06/05/23 0432 06/05/23 0713  BP: (!) 137/58 128/72  Pulse: 74 74  Resp: 18 17  Temp: 98.7 F (37.1 C) 98 F (36.7 C)  SpO2: 94% 95%    Recent laboratory studies:  Lab Results  Component Value Date   HGB 11.0 (L) 05/24/2023   HGB 10.7 (L) 05/23/2023   HGB 12.9 05/16/2023   Lab Results  Component Value Date   WBC 9.6 05/24/2023   PLT 207 05/24/2023   No results found for: "INR" Lab Results  Component Value Date   NA 137 05/23/2023   K 3.4 (L) 05/23/2023   CL 105 05/23/2023    CO2 27 05/23/2023   BUN 20 05/23/2023   CREATININE 1.10 (H) 05/23/2023   GLUCOSE 121 (H) 05/23/2023    Discharge Medications:   Allergies as of 06/05/2023   No Known Allergies      Medication List     TAKE these medications    acetaminophen 500 MG tablet Commonly known as: TYLENOL Take 2 tablets (1,000 mg total) by mouth every 8 (eight) hours. What changed:  how much to take when to take this reasons to take this Another medication with the same name was removed. Continue taking this medication, and follow the directions you see here.   amLODipine 10 MG tablet Commonly known as: NORVASC Take 10 mg by mouth every morning.   aspirin EC 81 MG tablet Take 1 tablet (81 mg total) by mouth 2 (two) times daily for 21 days. Swallow whole. Start taking on: June 12, 2023   bisacodyl 10 MG suppository Commonly known as: DULCOLAX Place 1 suppository (10 mg total) rectally daily as needed for moderate constipation.   bismuth subsalicylate 262 MG chewable tablet Commonly known as: PEPTO BISMOL Chew 262 mg by mouth as needed for indigestion or diarrhea or loose stools.   cyanocobalamin 1000 MCG tablet Commonly known as: VITAMIN B12 Take 1,000 mcg by mouth daily.   diphenhydramine-acetaminophen 25-500 MG Tabs tablet Commonly known as: TYLENOL PM Take 1 tablet by mouth at bedtime as needed (sleep).   docusate sodium 100 MG capsule Commonly known as: COLACE Take 1 capsule (100 mg total) by mouth 2 (two) times daily.   enoxaparin 30 MG/0.3ML injection Commonly known as: LOVENOX Inject 0.3 mLs (30 mg total) into the skin daily for 7 days. Start aspirin 81 mg BID x 3 weeks after completion of lovenox   gabapentin 300 MG capsule Commonly known as: NEURONTIN Take 300 mg by mouth 3 (three) times daily.   loratadine 10 MG tablet Commonly known as: CLARITIN Take 10 mg by mouth daily as needed for allergies.   losartan 50 MG tablet Commonly known as: COZAAR Take 50 mg by  mouth every morning.   multivitamin with minerals tablet Take 1 tablet by mouth daily. With omega 3 gummy   naphazoline-glycerin 0.012-0.25 % Soln Commonly known as: CLEAR EYES REDNESS Place 1-2 drops into both eyes daily as needed for eye irritation.   ondansetron 4 MG tablet Commonly known as: ZOFRAN Take 1 tablet (4 mg total) by mouth every 6 (six) hours as needed for nausea.   oxyCODONE 5 MG immediate release tablet Commonly known as: Roxicodone Take 0.5 tablets (2.5 mg total) by mouth every 8 (eight) hours as needed for breakthrough pain.   pantoprazole 40 MG tablet Commonly known as: PROTONIX Take 40  mg by mouth every morning.   senna 8.6 MG Tabs tablet Commonly known as: SENOKOT Take 1 tablet (8.6 mg total) by mouth 2 (two) times daily as needed for mild constipation.   traMADol 50 MG tablet Commonly known as: ULTRAM Take 1 tablet (50 mg total) by mouth every 6 (six) hours as needed for moderate pain (pain score 4-6).   triamterene-hydrochlorothiazide 37.5-25 MG capsule Commonly known as: DYAZIDE Take 1 capsule by mouth every morning.               Durable Medical Equipment  (From admission, onward)           Start     Ordered   06/05/23 1217  For home use only DME Bedside commode  Once       Question:  Patient needs a bedside commode to treat with the following condition  Answer:  Impaired mobility   06/05/23 1216   06/05/23 1216  For home use only DME Bedside commode  Once       Question:  Patient needs a bedside commode to treat with the following condition  Answer:  Status post knee surgery   06/05/23 1216            Diagnostic Studies: DG Knee Right Port Result Date: 05/22/2023 CLINICAL DATA:  Post right knee replacement EXAM: PORTABLE RIGHT KNEE - 1-2 VIEW COMPARISON:  None Available. FINDINGS: Changes of right knee replacement. No hardware bony complicating feature. IMPRESSION: Right knee replacement.  No visible complicating feature.  Electronically Signed   By: Charlett Nose M.D.   On: 05/22/2023 17:17    Disposition: Discharge disposition: 03-Skilled Nursing Facility           Contact information for follow-up providers     Evon Slack, PA-C Follow up in 2 week(s).   Specialties: Orthopedic Surgery, Emergency Medicine Contact information: [address] [place] [address] [phone]              Contact information for after-discharge care     Destination     [facility] .   Service: Skilled Nursing Contact information: [address] [place] [address] [phone]                      Signed: Evon Slack 06/05/2023, 12:17 PM

## 2023-05-25 NOTE — Plan of Care (Signed)
  Problem: Education: Goal: Knowledge of the prescribed therapeutic regimen will improve Outcome: Progressing   Problem: Pain Management: Goal: Pain level will decrease with appropriate interventions Outcome: Progressing   Problem: Skin Integrity: Goal: Will show signs of wound healing Outcome: Progressing   Problem: Clinical Measurements: Goal: Diagnostic test results will improve Outcome: Progressing   Problem: Coping: Goal: Level of anxiety will decrease Outcome: Progressing

## 2023-05-25 NOTE — TOC Progression Note (Signed)
 Transition of Care [facility]) - Progression Note    Patient Details  Name: ROSHAUNDA STARKEY MRN: [id number] Date of Birth: 08/30/1934  Transition of Care [facility]) CM/SW Contact  Marlowe Sax, RN Phone Number: 05/25/2023, 9:05 AM  Clinical Narrative:    Spoke with son Tammy Sours he spoke with [facility] and trying to get an exception to go to [facility] He will call them back at 11 No other facilities are near by and family would not be able to assist her with anything or be there for her,  He will call back as soon as he hears from [facility]        Expected Discharge Plan and Services                                               Social Determinants of Health (SDOH) Interventions SDOH Screenings   Food Insecurity: No Food Insecurity (05/22/2023)  Housing: Low Risk  (05/22/2023)  Transportation Needs: No Transportation Needs (05/22/2023)  Utilities: Not At Risk (05/22/2023)  Depression (PHQ2-9): Low Risk  (01/17/2023)  Financial Resource Strain: Low Risk  (05/18/2023)   Received from [facility]  Social Connections: Socially Isolated (05/22/2023)  Tobacco Use: Low Risk  (05/22/2023)    Readmission Risk Interventions     No data to display

## 2023-05-25 NOTE — TOC Progression Note (Signed)
 Transition of Care [facility]) - Progression Note    Patient Details  Name: Teresa Johns MRN: [id number] Date of Birth: [birth year]  Transition of Care [facility]) CM/SW Contact  Marlowe Sax, RN Phone Number: 05/25/2023, 4:09 PM  Clinical Narrative:    Received a call again from Tammy Sours, he provided the case ID number [facility] and assigned case manager Suszanne Finch I called [facility] [phone] and went thru the provider portal and Spoke with Madelaine Bhat I provided the case ID number and explained that I am calling from [facility] and I have a CASE ID number and provided my name and Facility name and member ID number [phone], he provided the information about insurance being active in [place] and active date, I again provided the case ID number and again asked to speak to Suszanne Finch and that the case may have been started yesterday he was checking for the case ID number he forwarded the call to UR and spoke with Francina Ames she sent me to provider services, After I asked to speak with Suszanne Finch in UR I again provided the case ID number again and again asked to speak with mark wilson or someone in UR I provided the name of the patient and the member ID number again, he provided the active ins plan information again and checked the case ID number again, I again let him know as far as I know the case was started yesterday by the family, He then put me on another extended hold, He came back and stated that the case is open and it will take 24-48 hours and I may not speak to UR about it that they will call the family once a decision is made  He stated that the correct case number is [id number] I called Tammy Sours the son back and let him know       Expected Discharge Plan and Services                                               Social Determinants of Health (SDOH) Interventions SDOH Screenings   Food Insecurity: No Food Insecurity (05/22/2023)  Housing: Low Risk  (05/22/2023)  Transportation Needs: No  Transportation Needs (05/22/2023)  Utilities: Not At Risk (05/22/2023)  Depression (PHQ2-9): Low Risk  (01/17/2023)  Financial Resource Strain: Low Risk  (05/18/2023)   Received from [facility]  Social Connections: Socially Isolated (05/22/2023)  Tobacco Use: Low Risk  (05/22/2023)    Readmission Risk Interventions     No data to display

## 2023-05-25 NOTE — TOC Progression Note (Signed)
 Transition of Care [facility]) - Progression Note    Patient Details  Name: Teresa Johns MRN: [id number] Date of Birth: 14-Apr-1934  Transition of Care [facility]) CM/SW Contact  Marlowe Sax, RN Phone Number: 05/25/2023, 3:21 PM  Clinical Narrative:    Called and spoke to son Tammy Sours, He reports that he has been on the phone with [facility] a few times to get the exception for her to go to [facility], He stated that the person ar [facility] reported that they have left 6 voicemail for the Case Worker at the hospital starting yesterday but was not able to provide a name for the case worker or a phone number, I as the Case Manager have not received any phone calls or Voice Mails from [place], He asked that I call them, I called the number for [facility] that he provided [phone] I asked to speak with someone that was assigned to the case for this patient to help with any information they need.  The person answering the phone explained that [facility] was In network with [facility], I explained that [facility] has not made a bed offer and I was calling to speak about an exception that the family requested yesterday, I provided the member ID and my name and number I was placed on a extended hold, she stated that there was no mention in the file that they were trying to reach me and did not know who to let me speak to, I asked to speak with whom ever was meeting about this case and she stated she sees nothing in the chart about it being looked at for consideration of going to [facility] facility, I called and spoke to greg relaying this information and he is going to call them back        Expected Discharge Plan and Services                                               Social Determinants of Health (SDOH) Interventions SDOH Screenings   Food Insecurity: No Food Insecurity (05/22/2023)  Housing: Low Risk  (05/22/2023)  Transportation Needs: No Transportation Needs (05/22/2023)  Utilities:  Not At Risk (05/22/2023)  Depression (PHQ2-9): Low Risk  (01/17/2023)  Financial Resource Strain: Low Risk  (05/18/2023)   Received from [facility]  Social Connections: Socially Isolated (05/22/2023)  Tobacco Use: Low Risk  (05/22/2023)    Readmission Risk Interventions     No data to display

## 2023-05-25 NOTE — Progress Notes (Signed)
 Physical Therapy Treatment Patient Details Name: Teresa Johns MRN: [id number] DOB: 19-Jun-1934 Today's Date: 05/25/2023   History of Present Illness Patient is a 89 year old with right knee osteoarthritis. S/p right total knee arthroplasty. History of back surgery    PT Comments  Pt was sitting in recliner with feet on floor and knee in flexed position. Supportive sister present. Author educated pt on positioning and importance of keeping knee in extended often to prevent HS tightness. Pt reports she lowered BLEs to floor due to her chronic back pain. Author added ice to polar care and discussed importance of keeping up doing the HEP. Pt was able to tolerate much more this afternoon versus AM session. She required min assist to stand from elevated EOB surface but mod assist from lower recliner surface. Pt tolerated ambulation to doorway of room and return. Very slow antalgic step to gait pattern. Constant vsc for lateral wt shift prior to opposite LE advancement. Pt was repositioned in recliner at conclusion of session with towel roll under RLE and polar care in place. DC recs remain appropriate to maximize her independence and safety with all ADLs. Acute PT will continue to follow per current POC.   If plan is discharge home, recommend the following: A lot of help with walking and/or transfers;A lot of help with bathing/dressing/bathroom;Assistance with cooking/housework;Direct supervision/assist for medications management;Direct supervision/assist for financial management;Assist for transportation;Help with stairs or ramp for entrance;Supervision due to cognitive status     Equipment Recommendations  Other (comment) (Defer to next level of care)       Precautions / Restrictions Precautions Precautions: Fall;Knee Precaution Booklet Issued: Yes (comment) Recall of Precautions/Restrictions: Impaired Restrictions Weight Bearing Restrictions Per Provider Order: Yes RLE Weight Bearing Per Provider  Order: Weight bearing as tolerated     Mobility  Bed Mobility Overal bed mobility: Needs Assistance Bed Mobility: Supine to Sit, Sit to Supine  Supine to sit: Mod assist, HOB elevated, Used rails  General bed mobility comments: pt required more assistance to exit bed today due to increased pain endorsed.    Transfers Overall transfer level: Needs assistance Equipment used: Rolling walker (2 wheels) Transfers: Sit to/from Stand Sit to Stand: Min assist, Mod assist  General transfer comment: min from elevated. mod from lower surface(recliner) vcs for handplacement and fwd wt shift    Ambulation/Gait Ambulation/Gait assistance: Contact guard assist, Min assist Gait Distance (Feet): 30 Feet Assistive device: Rolling walker (2 wheels) Gait Pattern/deviations: Step-to pattern, Shuffle Gait velocity: decreased   General Gait Details: pt was able to progress to ambulation with improved step quality this afternoon versus AM session. pt still has very slow antalgic step to pattern but was able to clear floor with BLEs to take steps     Balance Overall balance assessment: Needs assistance Sitting-balance support: Feet supported Sitting balance-Leahy Scale: Good     Standing balance support: Bilateral upper extremity supported, During functional activity, Reliant on assistive device for balance Standing balance-Leahy Scale: Fair Standing balance comment: very reliant on BUE support during all dynamic standing activity     Communication Communication Communication: No apparent difficulties  Cognition Arousal: Alert Behavior During Therapy: WFL for tasks assessed/performed   PT - Cognitive impairments: History of cognitive impairments, Orientation, Awareness, Problem solving, Safety/Judgement    PT - Cognition Comments: Pt is alert and oriented to situation but somewhat loopy/ lethargic/ easily distracted. per sister in room, cognition concerns are most likely from medications just  given prior to session. Following commands:  Intact      Cueing Cueing Techniques: Verbal cues, Tactile cues, Visual cues  Exercises Total Joint Exercises Goniometric ROM: 4-84    General Comments General comments (skin integrity, edema, etc.): replaced ice in polar care, eviewed importance of positioning, HEP, and continued routine mobility      Pertinent Vitals/Pain Pain Assessment Pain Assessment: 0-10 Pain Score: 6  Pain Location: R knee Pain Descriptors / Indicators: Discomfort Pain Intervention(s): Limited activity within patient's tolerance, Monitored during session, Premedicated before session, Repositioned, Ice applied     PT Goals (current goals can now be found in the care plan section) Acute Rehab PT Goals Patient Stated Goal: get better so I can eventually go home. Progress towards PT goals: Progressing toward goals    Frequency    BID       AM-PAC PT "6 Clicks" Mobility   Outcome Measure  Help needed turning from your back to your side while in a flat bed without using bedrails?: A Little Help needed moving from lying on your back to sitting on the side of a flat bed without using bedrails?: A Lot Help needed moving to and from a bed to a chair (including a wheelchair)?: A Lot Help needed standing up from a chair using your arms (e.g., wheelchair or bedside chair)?: A Lot Help needed to walk in hospital room?: A Lot Help needed climbing 3-5 steps with a railing? : Total 6 Click Score: 12    End of Session   Activity Tolerance: Patient tolerated treatment well Patient left: in chair;with call bell/phone within reach;with chair alarm set;with family/visitor present Nurse Communication: Mobility status PT Visit Diagnosis: Difficulty in walking, not elsewhere classified (R26.2);Other abnormalities of gait and mobility (R26.89)     Time: 4098-1191 PT Time Calculation (min) (ACUTE ONLY): 23 min  Charges:    $Gait Training: 8-22 mins $Therapeutic  Activity: 8-22 mins PT General Charges $$ ACUTE PT VISIT: 1 Visit                    Jetta Lout PTA 05/25/23, 4:30 PM

## 2023-05-25 NOTE — Progress Notes (Signed)
   Subjective: 3 Days Post-Op Procedure(s) (LRB): ARTHROPLASTY, KNEE, TOTAL (Right) Patient reports pain as mild.   Patient is well, and has had no acute complaints or problems Denies any CP, SOB, ABD pain. We will continue therapy today.  Plan is to go Skilled nursing facility after hospital stay.  Objective: Vital signs in last 24 hours: Temp:  [98.2 F (36.8 C)-99.2 F (37.3 C)] 98.7 F (37.1 C) (03/20 0309) Pulse Rate:  [86-94] 86 (03/20 0839) Resp:  [16-20] 18 (03/20 0839) BP: (139-155)/(66-71) 139/69 (03/20 0839) SpO2:  [90 %-95 %] 95 % (03/20 0839)  Intake/Output from previous day: 03/19 0701 - 03/20 0700 In: 380 [P.O.:380] Out: -  Intake/Output this shift: No intake/output data recorded.  Recent Labs    05/23/23 0629 05/24/23 0621  HGB 10.7* 11.0*   Recent Labs    05/23/23 0629 05/24/23 0621  WBC 7.4 9.6  RBC 3.40* 3.56*  HCT 32.0* 32.4*  PLT 207 207   Recent Labs    05/23/23 0629  NA 137  K 3.4*  CL 105  CO2 27  BUN 20  CREATININE 1.10*  GLUCOSE 121*  CALCIUM 9.1   No results for input(s): "LABPT", "INR" in the last 72 hours.  EXAM General - Patient is Alert, Appropriate, and Oriented Extremity - Neurovascular intact Sensation intact distally Intact pulses distally Dorsiflexion/Plantar flexion intact Dressing - dressing C/D/I and no drainage Motor Function - intact, moving foot and toes well on exam.   Past Medical History:  Diagnosis Date   Adrenal adenoma, left    benign   Anemia    CKD (chronic kidney disease) stage 4, GFR 15-29 ml/min (HCC)    Colitis    Complication of anesthesia    bp dropped during EGD/during hand surgery bp became elevated   GERD (gastroesophageal reflux disease)    Helicobacter pylori gastritis    Hiatal hernia    History of chicken pox    History of Helicobacter pylori infection    History of rectal bleeding    Left renal mass    Low vitamin D level    Neuropathy    Osteoporosis    Presbyesophagus     Primary hypertension    Primary osteoarthritis involving multiple joints    Renal insufficiency    Seasonal allergies    Spinal stenosis of lumbar region    Tinnitus     Assessment/Plan:   3 Days Post-Op Procedure(s) (LRB): ARTHROPLASTY, KNEE, TOTAL (Right) Principal Problem:   S/P total knee arthroplasty, right Active Problems:   Status post total knee replacement using cement, right  Estimated body mass index is 28.87 kg/m as calculated from the following:   Height as of this encounter: 5\' 3"  (1.6 m).   Weight as of this encounter: 73.9 kg. Advance diet Up with therapy Pain well controlled Vital signs are stable Work on bowel movement CM to assist with discharge to SNF    DVT Prophylaxis - Lovenox, TED hose, and SCDs Weight-Bearing as tolerated to right leg   T. Cranston Neighbor, PA-C [facility] 05/25/2023, 10:00 AM

## 2023-05-25 NOTE — Progress Notes (Signed)
 Physical Therapy Treatment Patient Details Name: Teresa Johns MRN: [id number] DOB: October 24, 1934 Today's Date: 05/25/2023   History of Present Illness Patient is a 89 year old with right knee osteoarthritis. S/p right total knee arthroplasty. History of back surgery    PT Comments  Pt was long sitting in bed upon arrival. She is alert and cooperative but endorsing more severe pain and lack of sleep previous night. She required more assistance to safely exit bed, stand, and take shuffling steps to recliner. Pt unable to actually tolerate lifting feet to take quality steps due to pain. Breakfast tray arrived during ambulation to chair. Pt was repositioned in recliner with call bell in reach and chair alarm in place. Author will return later this date for BID/2nd session to progress strength, gait, and overall safe functional mobility. DC recs remain appropriate to maximize pt's independence and safety with all ADLs.    If plan is discharge home, recommend the following: A lot of help with walking and/or transfers;A lot of help with bathing/dressing/bathroom;Assistance with cooking/housework;Direct supervision/assist for medications management;Direct supervision/assist for financial management;Assist for transportation;Help with stairs or ramp for entrance;Supervision due to cognitive status     Equipment Recommendations  Other (comment) (Defer to next level of care)       Precautions / Restrictions Precautions Precautions: Fall;Knee Precaution Booklet Issued: Yes (comment) Recall of Precautions/Restrictions: Impaired Restrictions Weight Bearing Restrictions Per Provider Order: Yes RLE Weight Bearing Per Provider Order: Weight bearing as tolerated     Mobility  Bed Mobility Overal bed mobility: Needs Assistance Bed Mobility: Supine to Sit, Sit to Supine  Supine to sit: Mod assist, HOB elevated, Used rails  General bed mobility comments: pt required more assistance to exit bed today due to  increased pain endorsed.    Transfers Overall transfer level: Needs assistance Equipment used: Rolling walker (2 wheels) Transfers: Sit to/from Stand Sit to Stand: Min assist, Mod assist  General transfer comment: min from elevated. mod from lower surface(recliner) vcs for handplacement and fwd wt shift    Ambulation/Gait Ambulation/Gait assistance: Contact guard assist, Min assist Gait Distance (Feet): 12 Feet Assistive device: Rolling walker (2 wheels) Gait Pattern/deviations: Step-to pattern, Shuffle Gait velocity: decreased  General Gait Details: Pt was able to shuffle her feet but due to increased pain, unable to lift Les to taking qulaity steps. breakfast tray arrived sogait trial limited to shuffling steps to recliner only    Balance Overall balance assessment: Needs assistance Sitting-balance support: Feet supported Sitting balance-Leahy Scale: Good     Standing balance support: Bilateral upper extremity supported, During functional activity, Reliant on assistive device for balance Standing balance-Leahy Scale: Fair Standing balance comment: very reliant on BUE support during all dynamic standing activity       Communication Communication Communication: No apparent difficulties  Cognition Arousal: Alert Behavior During Therapy: WFL for tasks assessed/performed   PT - Cognitive impairments: No apparent impairments    PT - Cognition Comments: Pt is alert and oriented to situation and current limitations Following commands: Intact      Cueing Cueing Techniques: Verbal cues, Tactile cues, Visual cues         Pertinent Vitals/Pain Pain Assessment Pain Assessment: 0-10 Pain Score: 7  Pain Location: R knee Pain Descriptors / Indicators: Discomfort Pain Intervention(s): Limited activity within patient's tolerance, Monitored during session, Premedicated before session, Repositioned, Ice applied     PT Goals (current goals can now be found in the care plan  section) Acute Rehab PT Goals Patient  Stated Goal: get better so I can eventually go home. Progress towards PT goals: Not progressing toward goals - comment (pain limited)    Frequency    BID       AM-PAC PT "6 Clicks" Mobility   Outcome Measure  Help needed turning from your back to your side while in a flat bed without using bedrails?: A Little Help needed moving from lying on your back to sitting on the side of a flat bed without using bedrails?: A Lot Help needed moving to and from a bed to a chair (including a wheelchair)?: A Lot Help needed standing up from a chair using your arms (e.g., wheelchair or bedside chair)?: A Lot Help needed to walk in hospital room?: A Lot Help needed climbing 3-5 steps with a railing? : Total 6 Click Score: 12    End of Session   Activity Tolerance: Patient tolerated treatment well Patient left: in chair;with call bell/phone within reach;with chair alarm set;with family/visitor present Nurse Communication: Mobility status PT Visit Diagnosis: Difficulty in walking, not elsewhere classified (R26.2);Other abnormalities of gait and mobility (R26.89)     Time: 7829-5621 PT Time Calculation (min) (ACUTE ONLY): 17 min  Charges:    $Therapeutic Activity: 8-22 mins PT General Charges $$ ACUTE PT VISIT: 1 Visit                     Jetta Lout PTA 05/25/23, 9:14 AM

## 2023-05-26 MED ORDER — MAGNESIUM HYDROXIDE 400 MG/5ML PO SUSP
30.0000 mL | Freq: Once | ORAL | Status: AC
  Administered 2023-05-26: 30 mL via ORAL
  Filled 2023-05-26: qty 30

## 2023-05-26 MED ORDER — BISACODYL 10 MG RE SUPP
10.0000 mg | Freq: Once | RECTAL | Status: AC
  Administered 2023-05-26: 10 mg via RECTAL
  Filled 2023-05-26: qty 1

## 2023-05-26 NOTE — Progress Notes (Signed)
 PT Cancellation Note  Patient Details Name: Teresa Johns MRN: [id number] DOB: 03-01-1935   Cancelled Treatment:     PT attempt. 2nd attempt this morning. First attempt, pt endorsing lack of sleep and severe pain. She had requested author return at a later time this morning. Currently, pt is sleeping soundly. Elected not to awake due to lack of sleep previous night. Author will return after lunch and continue to progress as able per current POC. DC recs remain appropriate to maximize her independence while assisting her to PLOF.   Rushie Chestnut 05/26/2023, 10:02 AM

## 2023-05-26 NOTE — Progress Notes (Signed)
 Physical Therapy Treatment Patient Details Name: Teresa Johns MRN: [id number] DOB: [birth year] Today's Date: 05/26/2023   History of Present Illness Patient is a 89 year old with right knee osteoarthritis. S/p right total knee arthroplasty. History of back surgery    PT Comments  Pt was sitting in recliner upon arrival. Supportive son and sister were present. She is agreeable to session and endorses need to use BR to urinate. Pt was able to tolerate standing from recliner, taking steps to [facility], and successfully urinating prior to ambulating out to [employer]. Once returned to room, pt performed stretching and ther ex to promote increased ROM and strength. Overall, pt demonstrated much improved activity tolerance and safety with mobility, transfers, and gait. DC recs remain appropriate to maximize her independence and safety with all ADLs.    If plan is discharge home, recommend the following: A lot of help with walking and/or transfers;A lot of help with bathing/dressing/bathroom;Assistance with cooking/housework;Direct supervision/assist for medications management;Direct supervision/assist for financial management;Assist for transportation;Help with stairs or ramp for entrance;Supervision due to cognitive status     Equipment Recommendations  Rolling walker (2 wheels);BSC/3in1       Precautions / Restrictions Precautions Precautions: Fall;Knee Precaution Booklet Issued: Yes (comment) Recall of Precautions/Restrictions: Impaired Restrictions Weight Bearing Restrictions Per Provider Order: Yes RLE Weight Bearing Per Provider Order: Weight bearing as tolerated     Mobility  Bed Mobility  General bed mobility comments: In recliner pre/post session    Transfers Overall transfer level: Needs assistance Equipment used: Rolling walker (2 wheels) Transfers: Sit to/from Stand Sit to Stand: Min assist, Mod assist  General transfer comment: Pt was able to stand from recliner/BSC with min/mod  assist of one. vcs for handplacement and fwd wt shift    Ambulation/Gait Ambulation/Gait assistance: Contact guard assist, Min assist Gait Distance (Feet): 75 Feet Assistive device: Rolling walker (2 wheels) Gait Pattern/deviations: Step-to pattern, Trunk flexed Gait velocity: decreased  General Gait Details: pt was able to ambulate out to RN station and return with much improved gait kinematics and abilities to lift BLEs to advance steps. Overall slow cadence with antalgic step to pattern    Balance Overall balance assessment: Needs assistance Sitting-balance support: Feet supported Sitting balance-Leahy Scale: Good     Standing balance support: Bilateral upper extremity supported, During functional activity, Reliant on assistive device for balance Standing balance-Leahy Scale: Fair Standing balance comment: very reliant on BUE support during all dynamic standing activity     Communication Communication Communication: No apparent difficulties  Cognition Arousal: Alert Behavior During Therapy: WFL for tasks assessed/performed   PT - Cognitive impairments: History of cognitive impairments, Orientation, Awareness, Problem solving, Safety/Judgement    PT - Cognition Comments: Pt is A and O x 2. Does consistently follow commands and remains pleasant throughout. Slower processing Following commands: Intact      Cueing Cueing Techniques: Verbal cues, Tactile cues  Exercises Total Joint Exercises Goniometric ROM: 2- 88    General Comments General comments (skin integrity, edema, etc.): Pt performed AAROM ther ex in chair. tolerated stretching well overall. Still lacking full extension. Reviewed importance of positioning and keep knee extended when in recliner      Pertinent Vitals/Pain Pain Assessment Pain Assessment: 0-10 Pain Score: 4  Pain Location: R knee Pain Descriptors / Indicators: Discomfort Pain Intervention(s): Limited activity within patient's tolerance, Monitored  during session, Premedicated before session, Repositioned     PT Goals (current goals can now be found in the care plan section)  Acute Rehab PT Goals Patient Stated Goal: "Get well so I can return home." Progress towards PT goals: Progressing toward goals    Frequency    BID       AM-PAC PT "6 Clicks" Mobility   Outcome Measure  Help needed turning from your back to your side while in a flat bed without using bedrails?: A Little Help needed moving from lying on your back to sitting on the side of a flat bed without using bedrails?: A Little Help needed moving to and from a bed to a chair (including a wheelchair)?: A Little Help needed standing up from a chair using your arms (e.g., wheelchair or bedside chair)?: A Lot Help needed to walk in hospital room?: A Lot Help needed climbing 3-5 steps with a railing? : A Lot 6 Click Score: 15    End of Session   Activity Tolerance: Patient tolerated treatment well Patient left: in chair;with call bell/phone within reach;with chair alarm set;with family/visitor present Nurse Communication: Mobility status PT Visit Diagnosis: Difficulty in walking, not elsewhere classified (R26.2);Other abnormalities of gait and mobility (R26.89)     Time: 1610-9604 PT Time Calculation (min) (ACUTE ONLY): 28 min  Charges:    $Gait Training: 8-22 mins $Therapeutic Activity: 8-22 mins PT General Charges $$ ACUTE PT VISIT: 1 Visit                    Jetta Lout PTA 05/26/23, 4:14 PM

## 2023-05-26 NOTE — Plan of Care (Signed)
  Problem: Education: Goal: Knowledge of the prescribed therapeutic regimen will improve Outcome: Progressing   Problem: Pain Management: Goal: Pain level will decrease with appropriate interventions Outcome: Progressing   Problem: Skin Integrity: Goal: Will show signs of wound healing Outcome: Progressing   Problem: Pain Managment: Goal: General experience of comfort will improve and/or be controlled Outcome: Progressing   Problem: Safety: Goal: Ability to remain free from injury will improve Outcome: Progressing

## 2023-05-26 NOTE — Plan of Care (Signed)

## 2023-05-26 NOTE — Progress Notes (Signed)
   Subjective: 4 Days Post-Op Procedure(s) (LRB): ARTHROPLASTY, KNEE, TOTAL (Right) Patient reports pain as mild.   Patient is well, and has had no acute complaints or problems Denies any CP, SOB, ABD pain.  No nausea vomiting We will continue therapy today.  Has not had a bowel movement Plan is to go Skilled nursing facility after hospital stay.  Objective: Vital signs in last 24 hours: Temp:  [97.7 F (36.5 C)-98.2 F (36.8 C)] 98.2 F (36.8 C) (03/21 0749) Pulse Rate:  [72-90] 82 (03/21 0749) Resp:  [16-18] 16 (03/21 0749) BP: (114-155)/(58-70) 127/60 (03/21 0749) SpO2:  [92 %-96 %] 94 % (03/21 0749)  Intake/Output from previous day: No intake/output data recorded. Intake/Output this shift: No intake/output data recorded.  Recent Labs    05/24/23 0621  HGB 11.0*   Recent Labs    05/24/23 0621  WBC 9.6  RBC 3.56*  HCT 32.4*  PLT 207   No results for input(s): "NA", "K", "CL", "CO2", "BUN", "CREATININE", "GLUCOSE", "CALCIUM" in the last 72 hours.  No results for input(s): "LABPT", "INR" in the last 72 hours.  EXAM General - Patient is Alert, Appropriate, and Oriented Extremity - Neurovascular intact Sensation intact distally Intact pulses distally Dorsiflexion/Plantar flexion intact Dressing - dressing C/D/I and no drainage Motor Function - intact, moving foot and toes well on exam.   Past Medical History:  Diagnosis Date   Adrenal adenoma, left    benign   Anemia    CKD (chronic kidney disease) stage 4, GFR 15-29 ml/min (HCC)    Colitis    Complication of anesthesia    bp dropped during EGD/during hand surgery bp became elevated   GERD (gastroesophageal reflux disease)    Helicobacter pylori gastritis    Hiatal hernia    History of chicken pox    History of Helicobacter pylori infection    History of rectal bleeding    Left renal mass    Low vitamin D level    Neuropathy    Osteoporosis    Presbyesophagus    Primary hypertension    Primary  osteoarthritis involving multiple joints    Renal insufficiency    Seasonal allergies    Spinal stenosis of lumbar region    Tinnitus     Assessment/Plan:   4 Days Post-Op Procedure(s) (LRB): ARTHROPLASTY, KNEE, TOTAL (Right) Principal Problem:   S/P total knee arthroplasty, right Active Problems:   Status post total knee replacement using cement, right  Estimated body mass index is 28.87 kg/m as calculated from the following:   Height as of this encounter: 5\' 3"  (1.6 m).   Weight as of this encounter: 73.9 kg. Advance diet Up with therapy Pain well controlled Vital signs are stable Work on bowel movement CM to assist with discharge to SNF    DVT Prophylaxis - Lovenox, TED hose, and SCDs Weight-Bearing as tolerated to right leg   T. Cranston Neighbor, PA-C [facility] 05/26/2023, 9:07 AM

## 2023-05-27 NOTE — Plan of Care (Signed)
  Problem: Activity: Goal: Ability to avoid complications of mobility impairment will improve Outcome: Progressing   Problem: Pain Management: Goal: Pain level will decrease with appropriate interventions Outcome: Progressing   Problem: Skin Integrity: Goal: Will show signs of wound healing Outcome: Progressing   Problem: Nutrition: Goal: Adequate nutrition will be maintained Outcome: Progressing

## 2023-05-27 NOTE — TOC Progression Note (Signed)
 Transition of Care [facility]) - Progression Note    Patient Details  Name: Teresa Johns MRN: [id number] Date of Birth: January 05, 1935  Transition of [facility]) CM/SW Contact  Bing Quarry, RN Phone Number: 05/27/2023, 12:50 PM  Clinical Narrative:  3/22: Sherron Monday with Dr.Hooten, he contacted CM, regarding the insurance situation well documented by prior RN CM. Family/patient has not heard back from insurance regarding out of network placement with [employer]. [employer] remain pending in [place]. Left VM with Tobi Bastos at [facility] to call back RN CM. Dr Ernest Pine attempting to let patient/family know the status. Prior discharge order canceled due to unsafe discharge to home per family, patient lives alone, family iss unable to provide care needed with home health.    Gabriel Cirri MSN RN CM  RN Case Manager Brady  Transitions of Care Direct Dial: [phone] (Weekends Only) [facility] Main Office Phone: [phone] [facility] Fax: [phone] [email or web address]          Expected Discharge Plan and Services         Expected Discharge Date: 05/27/23                                     Social Determinants of Health (SDOH) Interventions SDOH Screenings   Food Insecurity: No Food Insecurity (05/22/2023)  Housing: Low Risk  (05/22/2023)  Transportation Needs: No Transportation Needs (05/22/2023)  Utilities: Not At Risk (05/22/2023)  Depression (PHQ2-9): Low Risk  (01/17/2023)  Financial Resource Strain: Low Risk  (05/18/2023)   Received from [facility]  Social Connections: Socially Isolated (05/22/2023)  Tobacco Use: Low Risk  (05/22/2023)    Readmission Risk Interventions     No data to display

## 2023-05-27 NOTE — Progress Notes (Signed)
 Physical Therapy Treatment Patient Details Name: Teresa Johns MRN: [id number] DOB: 14-May-1934 Today's Date: 05/27/2023   History of Present Illness Patient is a 89 year old with right knee osteoarthritis. S/p right total knee arthroplasty. History of back surgery    PT Comments  Pt is progressing well with mobility demonstrating decreased assist needed for bed mobility (Min A), transfers (min to CGA), dynamic standing balance (CGA -supervision), and gait with RW 100' (CGA to supervision).  Pt demonstrates steadiness overall with mobility but continues to report significant pain in R knee and has increased weight bearing through UE s with gait. DC recs remain appropriate to maximize her independence and safety with all ADLs.      If plan is discharge home, recommend the following: A lot of help with bathing/dressing/bathroom;Assistance with cooking/housework;Direct supervision/assist for medications management;Direct supervision/assist for financial management;Assist for transportation;Help with stairs or ramp for entrance;Supervision due to cognitive status;A little help with walking and/or transfers   Can travel by private vehicle     Yes  Equipment Recommendations  Rolling walker (2 wheels);BSC/3in1    Recommendations for Other Services       Precautions / Restrictions Precautions Precautions: Fall;Knee Precaution Booklet Issued: Yes (comment) Recall of Precautions/Restrictions: Impaired Restrictions Weight Bearing Restrictions Per Provider Order: Yes RLE Weight Bearing Per Provider Order: Weight bearing as tolerated     Mobility  Bed Mobility Overal bed mobility: Needs Assistance Bed Mobility: Supine to Sit, Sit to Supine     Supine to sit: Min assist, HOB elevated, Used rails     General bed mobility comments: In recliner post session    Transfers Overall transfer level: Needs assistance Equipment used: Rolling walker (2 wheels) Transfers: Sit to/from Stand, Bed to  chair/wheelchair/BSC Sit to Stand: Min assist, Contact guard assist   Step pivot transfers: Contact guard assist       General transfer comment: Pt was able to stand from recliner/BSC with  CGAof one. vcs for handplacement    Ambulation/Gait Ambulation/Gait assistance: Contact guard assist Gait Distance (Feet): 100 Feet Assistive device: Rolling walker (2 wheels) Gait Pattern/deviations: Step-to pattern, Step-through pattern, Ataxic, Trunk flexed Gait velocity: decreased     General Gait Details: progressed from step to pattern to step through pattern after a few steps.   Stairs             Wheelchair Mobility     Tilt Bed    Modified Rankin (Stroke Patients Only)       Balance Overall balance assessment: Needs assistance Sitting-balance support: Feet supported Sitting balance-Leahy Scale: Good     Standing balance support: During functional activity, Reliant on assistive device for balance, Single extremity supported, No upper extremity supported Standing balance-Leahy Scale: Good Standing balance comment: able to perform low level dynamic standing balance with 1-0 UEsupport but requires heavy UE support with walking activities.                            Communication Communication Communication: No apparent difficulties  Cognition Arousal: Alert Behavior During Therapy: WFL for tasks assessed/performed   PT - Cognitive impairments: History of cognitive impairments, Orientation, Awareness, Problem solving, Safety/Judgement                       PT - Cognition Comments: Does consistently follow commands and remains pleasant throughout. Slower processing Following commands: Intact      Cueing Cueing Techniques: Verbal cues, Tactile  cues  Exercises Total Joint Exercises Goniometric ROM: 2-90    General Comments        Pertinent Vitals/Pain Pain Assessment Pain Assessment: 0-10 Pain Score: 6  Pain Location: R knee Pain  Descriptors / Indicators: Discomfort, Stabbing Pain Intervention(s): Limited activity within patient's tolerance, Monitored during session, Premedicated before session    Home Living                          Prior Function            PT Goals (current goals can now be found in the care plan section) Acute Rehab PT Goals Patient Stated Goal: "Get well so I can return home." PT Goal Formulation: With patient Time For Goal Achievement: 06/06/23 Potential to Achieve Goals: Good Progress towards PT goals: Progressing toward goals    Frequency    BID      PT Plan      Co-evaluation              AM-PAC PT "6 Clicks" Mobility   Outcome Measure  Help needed turning from your back to your side while in a flat bed without using bedrails?: A Little Help needed moving from lying on your back to sitting on the side of a flat bed without using bedrails?: A Little Help needed moving to and from a bed to a chair (including a wheelchair)?: A Little Help needed standing up from a chair using your arms (e.g., wheelchair or bedside chair)?: A Little Help needed to walk in hospital room?: A Little Help needed climbing 3-5 steps with a railing? : A Lot 6 Click Score: 17    End of Session Equipment Utilized During Treatment: Gait belt Activity Tolerance: Patient tolerated treatment well Patient left: in chair;with call bell/phone within reach;with chair alarm set Nurse Communication: Mobility status PT Visit Diagnosis: Difficulty in walking, not elsewhere classified (R26.2);Other abnormalities of gait and mobility (R26.89)     Time: 0960-4540 PT Time Calculation (min) (ACUTE ONLY): 40 min  Charges:    $Therapeutic Activity: 38-52 mins PT General Charges $$ ACUTE PT VISIT: 1 Visit                     Hortencia Conradi, PTA  05/27/23, 10:03 AM

## 2023-05-27 NOTE — Progress Notes (Signed)
   Subjective: 5 Days Post-Op Procedure(s) (LRB): ARTHROPLASTY, KNEE, TOTAL (Right) Patient reports pain as mild.   Patient is well, and has had no acute complaints or problems Denies any CP, SOB, ABD pain.  No nausea vomiting We will continue therapy today.  Has had a bowel movement Plan is to go home after hospital stay.  Objective: Vital signs in last 24 hours: Temp:  [97.9 F (36.6 C)-98.7 F (37.1 C)] 98.4 F (36.9 C) (03/22 0459) Pulse Rate:  [72-91] 72 (03/22 0459) Resp:  [14-18] 18 (03/22 0459) BP: (120-134)/(51-61) 133/56 (03/22 0459) SpO2:  [94 %-97 %] 95 % (03/22 0459)  Intake/Output from previous day: 03/21 0701 - 03/22 0700 In: 600 [P.O.:600] Out: -  Intake/Output this shift: Total I/O In: 240 [P.O.:240] Out: -   No results for input(s): "HGB" in the last 72 hours.  No results for input(s): "WBC", "RBC", "HCT", "PLT" in the last 72 hours.  No results for input(s): "NA", "K", "CL", "CO2", "BUN", "CREATININE", "GLUCOSE", "CALCIUM" in the last 72 hours.  No results for input(s): "LABPT", "INR" in the last 72 hours.  EXAM General - Patient is Alert, Appropriate, and Oriented Extremity - Neurovascular intact Sensation intact distally Intact pulses distally Dorsiflexion/Plantar flexion intact Dressing - dressing C/D/I and no drainage Motor Function - intact, moving foot and toes well on exam.  Ambulated 75 feet with physical therapy.  Past Medical History:  Diagnosis Date   Adrenal adenoma, left    benign   Anemia    CKD (chronic kidney disease) stage 4, GFR 15-29 ml/min (HCC)    Colitis    Complication of anesthesia    bp dropped during EGD/during hand surgery bp became elevated   GERD (gastroesophageal reflux disease)    Helicobacter pylori gastritis    Hiatal hernia    History of chicken pox    History of Helicobacter pylori infection    History of rectal bleeding    Left renal mass    Low vitamin D level    Neuropathy    Osteoporosis     Presbyesophagus    Primary hypertension    Primary osteoarthritis involving multiple joints    Renal insufficiency    Seasonal allergies    Spinal stenosis of lumbar region    Tinnitus     Assessment/Plan:   5 Days Post-Op Procedure(s) (LRB): ARTHROPLASTY, KNEE, TOTAL (Right) Principal Problem:   S/P total knee arthroplasty, right Active Problems:   Status post total knee replacement using cement, right  Estimated body mass index is 28.87 kg/m as calculated from the following:   Height as of this encounter: 5\' 3"  (1.6 m).   Weight as of this encounter: 73.9 kg. Advance diet Up with therapy Pain well controlled Vital signs are stable Work on bowel movement CM to assist with discharge to home with home health physical therapy.    DVT Prophylaxis - Lovenox, TED hose, and SCDs Weight-Bearing as tolerated to right leg  Dedra Skeens, PA-C [facility] 05/27/2023, 6:38 AM

## 2023-05-27 NOTE — Progress Notes (Signed)
 Physical Therapy Treatment Patient Details Name: Teresa Johns MRN: [id number] DOB: [birth year] Today's Date: 05/27/2023   History of Present Illness Patient is a 89 year old with right knee osteoarthritis. S/p right total knee arthroplasty. History of back surgery    PT Comments  Pt reported more pain in R Knee this pm session; RN made aware and gave pt pain meds.  Limited gait to functional task of ambulating to and from bathroom; pt performing standing functional tasks with steadiness at an overall supervision to CGA level.  Returned to recliner and pt performed seat LE there. ex for strengthening at [facility].  Continued PT will assist pt towards greater dynamic standing balance, LE strengthening, and activity tolerance to increase safety and independence and decrease burden of care with functional mobility.     If plan is discharge home, recommend the following: A lot of help with bathing/dressing/bathroom;Assistance with cooking/housework;Direct supervision/assist for medications management;Direct supervision/assist for financial management;Assist for transportation;Help with stairs or ramp for entrance;Supervision due to cognitive status;A little help with walking and/or transfers   Can travel by private vehicle     Yes  Equipment Recommendations  Rolling walker (2 wheels);BSC/3in1    Recommendations for Other Services       Precautions / Restrictions Precautions Precautions: Fall;Knee Precaution Booklet Issued: Yes (comment) Recall of Precautions/Restrictions: Intact Restrictions Weight Bearing Restrictions Per Provider Order: Yes RLE Weight Bearing Per Provider Order: Weight bearing as tolerated     Mobility  Bed Mobility Overal bed mobility: Needs Assistance Bed Mobility: Supine to Sit, Sit to Supine     Supine to sit: Min assist, HOB elevated, Used rails     General bed mobility comments: NT, pt in recliner pre/post session.    Transfers Overall transfer level:  Needs assistance Equipment used: Rolling walker (2 wheels) Transfers: Sit to/from Stand, Bed to chair/wheelchair/BSC Sit to Stand: Min assist   Step pivot transfers: Min assist       General transfer comment: Pt was able to stand from recliner/BSC with  Min. no vcs needed for handplacement    Ambulation/Gait Ambulation/Gait assistance: Contact guard assist Gait Distance (Feet): 25 Feet (x2) Assistive device: Rolling walker (2 wheels) Gait Pattern/deviations: Step-to pattern, Step-through pattern, Ataxic, Trunk flexed Gait velocity: decreased     General Gait Details: progressed from step to pattern to step through pattern after a few steps.  Limited distance pm session 2/2 pt having more R knee pain and for time with seated LE ther ex.   Stairs             Wheelchair Mobility     Tilt Bed    Modified Rankin (Stroke Patients Only)       Balance Overall balance assessment: Needs assistance Sitting-balance support: Feet supported Sitting balance-Leahy Scale: Good     Standing balance support: During functional activity, Reliant on assistive device for balance, Single extremity supported, No upper extremity supported Standing balance-Leahy Scale: Good Standing balance comment: able to perform low level dynamic standing balance with 1-0 UEsupport but requires heavy UE support with walking activities.                            Communication Communication Communication: No apparent difficulties  Cognition Arousal: Alert Behavior During Therapy: WFL for tasks assessed/performed   PT - Cognitive impairments: History of cognitive impairments, Orientation, Awareness, Problem solving, Safety/Judgement  PT - Cognition Comments: Does consistently follow commands and remains pleasant throughout. Slower processing Following commands: Intact      Cueing Cueing Techniques: Verbal cues, Tactile cues  Exercises Total Joint  Exercises Ankle Circles/Pumps: AROM, Strengthening, 10 reps, Seated, Both Heel Slides: AAROM, Strengthening, Right, 10 reps Goniometric ROM: 2-90    General Comments        Pertinent Vitals/Pain Pain Assessment Pain Assessment: 0-10 Pain Score: 10-Worst pain ever Pain Location: R knee Pain Descriptors / Indicators: Discomfort, Stabbing Pain Intervention(s): Patient requesting pain meds-RN notified, Monitored during session, Limited activity within patient's tolerance    Home Living                          Prior Function            PT Goals (current goals can now be found in the care plan section) Acute Rehab PT Goals Patient Stated Goal: "Get well so I can return home." PT Goal Formulation: With patient Time For Goal Achievement: 06/06/23 Potential to Achieve Goals: Good Progress towards PT goals: Progressing toward goals    Frequency    BID      PT Plan      Co-evaluation              AM-PAC PT "6 Clicks" Mobility   Outcome Measure    Help needed moving from lying on your back to sitting on the side of a flat bed without using bedrails?: A Little Help needed moving to and from a bed to a chair (including a wheelchair)?: A Little   Help needed to walk in hospital room?: A Little Help needed climbing 3-5 steps with a railing? : A Lot 6 Click Score: 11    End of Session Equipment Utilized During Treatment: Gait belt Activity Tolerance: Patient limited by pain Patient left: in chair;with call bell/phone within reach;with chair alarm set Nurse Communication: Mobility status PT Visit Diagnosis: Difficulty in walking, not elsewhere classified (R26.2);Other abnormalities of gait and mobility (R26.89)     Time: 1610-9604 PT Time Calculation (min) (ACUTE ONLY): 26 min  Charges:    $Therapeutic Activity: 23-37 mins PT General Charges $$ ACUTE PT VISIT: 1 Visit                     Hortencia Conradi, PTA  05/27/23, 2:07 PM

## 2023-05-27 NOTE — Plan of Care (Signed)
  Problem: Education: Goal: Knowledge of the prescribed therapeutic regimen will improve Outcome: Progressing Goal: Individualized Educational Video(s) Outcome: Progressing   Problem: Activity: Goal: Ability to avoid complications of mobility impairment will improve Outcome: Progressing Goal: Range of joint motion will improve Outcome: Progressing   Problem: Activity: Goal: Range of joint motion will improve Outcome: Progressing   Problem: Clinical Measurements: Goal: Postoperative complications will be avoided or minimized Outcome: Progressing   Problem: Pain Management: Goal: Pain level will decrease with appropriate interventions Outcome: Progressing   Problem: Skin Integrity: Goal: Will show signs of wound healing Outcome: Progressing   Problem: Education: Goal: Knowledge of General Education information will improve Description: Including pain rating scale, medication(s)/side effects and non-pharmacologic comfort measures Outcome: Progressing

## 2023-05-28 NOTE — Plan of Care (Signed)
  Problem: Clinical Measurements: Goal: Postoperative complications will be avoided or minimized Outcome: Progressing   Problem: Pain Management: Goal: Pain level will decrease with appropriate interventions Outcome: Progressing   Problem: Clinical Measurements: Goal: Will remain free from infection Outcome: Progressing   

## 2023-05-28 NOTE — Progress Notes (Signed)
 Physical Therapy Treatment Patient Details Name: Teresa Johns MRN: [id number] DOB: [birth year] Today's Date: 05/28/2023   History of Present Illness Patient is a 89 year old with right knee osteoarthritis. S/p right total knee arthroplasty. History of back surgery    PT Comments  Pt in chair.  Participated in exercises as described below.  She is able to progress gait this AM 100' with RW and cga/min a x 1 for education about hand placements and navigation with RW.  Overall progressing well.  Does c/o B shoulder soreness from heavy reliance on walker.  Educated that that should improve as strength and reliance on RW decreases.  Remained in recliner after session.    If plan is discharge home, recommend the following: Assistance with cooking/housework;Direct supervision/assist for medications management;Direct supervision/assist for financial management;Assist for transportation;Help with stairs or ramp for entrance;Supervision due to cognitive status;A little help with walking and/or transfers;A little help with bathing/dressing/bathroom   Can travel by private vehicle     Yes  Equipment Recommendations  Rolling walker (2 wheels);BSC/3in1    Recommendations for Other Services       Precautions / Restrictions Precautions Precautions: Fall;Knee Precaution Booklet Issued: Yes (comment) Recall of Precautions/Restrictions: Intact Restrictions Weight Bearing Restrictions Per Provider Order: Yes RLE Weight Bearing Per Provider Order: Weight bearing as tolerated     Mobility  Bed Mobility               General bed mobility comments: NT, pt in recliner pre/post session. Patient Response: Cooperative  Transfers Overall transfer level: Needs assistance   Transfers: Sit to/from Stand Sit to Stand: Min assist   Step pivot transfers: Min assist       General transfer comment: Pt was able to stand from recliner/BSC with  Min. no vcs needed for handplacement     Ambulation/Gait Ambulation/Gait assistance: Contact guard assist, Min assist Gait Distance (Feet): 100 Feet Assistive device: Rolling walker (2 wheels) Gait Pattern/deviations: Step-to pattern, Step-through pattern, Ataxic, Trunk flexed Gait velocity: decreased         Stairs             Wheelchair Mobility     Tilt Bed Tilt Bed Patient Response: Cooperative  Modified Rankin (Stroke Patients Only)       Balance Overall balance assessment: Needs assistance Sitting-balance support: Feet supported Sitting balance-Leahy Scale: Good     Standing balance support: During functional activity, Reliant on assistive device for balance, Single extremity supported, No upper extremity supported Standing balance-Leahy Scale: Fair                              Communication    Cognition Arousal: Alert Behavior During Therapy: WFL for tasks assessed/performed   PT - Cognitive impairments: History of cognitive impairments, Orientation, Awareness, Problem solving, Safety/Judgement                         Following commands: Intact      Cueing Cueing Techniques: Verbal cues, Tactile cues  Exercises Total Joint Exercises Goniometric ROM: 4-90 Other Exercises Other Exercises: seated AROM and stretching prior to gait,  standing ex x 10 with walker support    General Comments        Pertinent Vitals/Pain Pain Assessment Pain Assessment: Faces Faces Pain Scale: Hurts little more Pain Location: R knee Pain Descriptors / Indicators: Discomfort, Stabbing Pain Intervention(s): Limited activity within patient's tolerance, Monitored  during session, Repositioned, Ice applied    Home Living                          Prior Function            PT Goals (current goals can now be found in the care plan section) Progress towards PT goals: Progressing toward goals    Frequency    BID      PT Plan      Co-evaluation               AM-PAC PT "6 Clicks" Mobility   Outcome Measure  Help needed turning from your back to your side while in a flat bed without using bedrails?: A Little Help needed moving from lying on your back to sitting on the side of a flat bed without using bedrails?: A Little Help needed moving to and from a bed to a chair (including a wheelchair)?: A Little Help needed standing up from a chair using your arms (e.g., wheelchair or bedside chair)?: A Little Help needed to walk in hospital room?: A Little Help needed climbing 3-5 steps with a railing? : A Lot 6 Click Score: 17    End of Session Equipment Utilized During Treatment: Gait belt Activity Tolerance: Patient limited by pain Patient left: in chair;with call bell/phone within reach;with chair alarm set Nurse Communication: Mobility status PT Visit Diagnosis: Difficulty in walking, not elsewhere classified (R26.2);Other abnormalities of gait and mobility (R26.89)     Time: 1914-7829 PT Time Calculation (min) (ACUTE ONLY): 17 min  Charges:    $Gait Training: 8-22 mins PT General Charges $$ ACUTE PT VISIT: 1 Visit                   Danielle Dess, PTA 05/28/23, 11:27 AM

## 2023-05-28 NOTE — Progress Notes (Signed)
 Mobility Specialist - Progress Note     05/28/23 1652  Mobility  Activity Ambulated with assistance in hallway;Stood at bedside  Level of Assistance Contact guard assist, steadying assist  Assistive Device Front wheel walker  Distance Ambulated (ft) 15 ft  Range of Motion/Exercises Active  RLE Weight Bearing Per Provider Order WBAT  Activity Response Tolerated well  Mobility Referral Yes  Mobility visit 1 Mobility  Mobility Specialist Start Time (ACUTE ONLY) 1629  Mobility Specialist Stop Time (ACUTE ONLY) 1653  Mobility Specialist Time Calculation (min) (ACUTE ONLY) 24 min   Pt resting on BSC upon entry on RA. Pt STS and ambulates in room around bed to perform hygiene tasks( brush teeth and wash face/body). Pt returned to recliner and ice pack reconnected. Pt chair alarm activated.   Johnathan Hausen Mobility Specialist 05/28/23, 5:04 PM

## 2023-05-28 NOTE — Plan of Care (Signed)
  Problem: Education: Goal: Knowledge of the prescribed therapeutic regimen will improve 05/28/2023 0635 by Evelena Asa, RN Outcome: Progressing 05/28/2023 0635 by Evelena Asa, RN Outcome: Progressing Goal: Individualized Educational Video(s) 05/28/2023 1610 by Evelena Asa, RN Outcome: Progressing 05/28/2023 0635 by Evelena Asa, RN Outcome: Progressing   Problem: Activity: Goal: Ability to avoid complications of mobility impairment will improve 05/28/2023 0635 by Evelena Asa, RN Outcome: Progressing 05/28/2023 0635 by Evelena Asa, RN Outcome: Progressing Goal: Range of joint motion will improve 05/28/2023 0635 by Evelena Asa, RN Outcome: Progressing 05/28/2023 0635 by Evelena Asa, RN Outcome: Progressing   Problem: Pain Management: Goal: Pain level will decrease with appropriate interventions 05/28/2023 0635 by Evelena Asa, RN Outcome: Progressing 05/28/2023 0635 by Evelena Asa, RN Outcome: Progressing   Problem: Skin Integrity: Goal: Will show signs of wound healing 05/28/2023 0635 by Evelena Asa, RN Outcome: Progressing 05/28/2023 0635 by Evelena Asa, RN Outcome: Progressing   Problem: Activity: Goal: Risk for activity intolerance will decrease 05/28/2023 0635 by Evelena Asa, RN Outcome: Progressing 05/28/2023 0635 by Evelena Asa, RN Outcome: Progressing   Problem: Activity: Goal: Risk for activity intolerance will decrease 05/28/2023 0635 by Evelena Asa, RN Outcome: Progressing 05/28/2023 0635 by Evelena Asa, RN Outcome: Progressing   Problem: Pain Managment: Goal: General experience of comfort will improve and/or be controlled 05/28/2023 0635 by Evelena Asa, RN Outcome: Progressing 05/28/2023 0635 by Evelena Asa, RN Outcome: Progressing   Problem: Safety: Goal: Ability to remain free from injury will improve 05/28/2023 0635 by Evelena Asa, RN Outcome: Progressing 05/28/2023 0635 by Evelena Asa, RN Outcome:  Progressing

## 2023-05-28 NOTE — Progress Notes (Signed)
   Subjective: 6 Days Post-Op Procedure(s) (LRB): ARTHROPLASTY, KNEE, TOTAL (Right) Patient reports pain as mild.   Patient is well, and has had no acute complaints or problems Denies any CP, SOB, ABD pain.  No nausea vomiting. + BM We will continue therapy today.  Plan is to go Skilled nursing facility after hospital stay.  Objective: Vital signs in last 24 hours: Temp:  [98 F (36.7 C)-98.9 F (37.2 C)] 98 F (36.7 C) (03/23 0801) Pulse Rate:  [75-96] 75 (03/23 0801) Resp:  [16] 16 (03/23 0801) BP: (115-140)/(50-60) 123/57 (03/23 0801) SpO2:  [94 %-96 %] 94 % (03/23 0801)  Intake/Output from previous day: 03/22 0701 - 03/23 0700 In: 240 [P.O.:240] Out: -  Intake/Output this shift: No intake/output data recorded.  No results for input(s): "HGB" in the last 72 hours.  No results for input(s): "WBC", "RBC", "HCT", "PLT" in the last 72 hours.  No results for input(s): "NA", "K", "CL", "CO2", "BUN", "CREATININE", "GLUCOSE", "CALCIUM" in the last 72 hours.  No results for input(s): "LABPT", "INR" in the last 72 hours.  EXAM General - Patient is Alert, Appropriate, and Oriented Extremity - Neurovascular intact Sensation intact distally Intact pulses distally Dorsiflexion/Plantar flexion intact -Homans' sign bilaterally Dressing - dressing C/D/I and no drainage Motor Function - intact, moving foot and toes well on exam.   Past Medical History:  Diagnosis Date   Adrenal adenoma, left    benign   Anemia    CKD (chronic kidney disease) stage 4, GFR 15-29 ml/min (HCC)    Colitis    Complication of anesthesia    bp dropped during EGD/during hand surgery bp became elevated   GERD (gastroesophageal reflux disease)    Helicobacter pylori gastritis    Hiatal hernia    History of chicken pox    History of Helicobacter pylori infection    History of rectal bleeding    Left renal mass    Low vitamin D level    Neuropathy    Osteoporosis    Presbyesophagus    Primary  hypertension    Primary osteoarthritis involving multiple joints    Renal insufficiency    Seasonal allergies    Spinal stenosis of lumbar region    Tinnitus     Assessment/Plan:   6 Days Post-Op Procedure(s) (LRB): ARTHROPLASTY, KNEE, TOTAL (Right) Principal Problem:   S/P total knee arthroplasty, right Active Problems:   Status post total knee replacement using cement, right  Estimated body mass index is 28.87 kg/m as calculated from the following:   Height as of this encounter: 5\' 3"  (1.6 m).   Weight as of this encounter: 73.9 kg. Advance diet Up with therapy Pain well controlled Vital signs are stable Slow progress of physical therapy CM to assist with discharge to SNF, hopefully Monday   DVT Prophylaxis - Lovenox, TED hose, and SCDs Weight-Bearing as tolerated to right leg   T. Cranston Neighbor, PA-C [facility] 05/28/2023, 8:14 AM

## 2023-05-29 NOTE — TOC Progression Note (Signed)
 Transition of Care [facility]) - Progression Note    Patient Details  Name: Teresa Johns MRN: [id number] Date of Birth: Sep 02, 1934  [facility] CM/SW Contact  Marlowe Sax, RN Phone Number: 05/29/2023, 12:01 PM  Clinical Narrative:     Sherron Monday with Dorathy Daft at [facility] she is submitting to [employer] to see if they are able to get Ins approval, [employer] told her to submit the request and they will approve       Expected Discharge Plan and Services         Expected Discharge Date: 05/27/23                                     Social Determinants of Health (SDOH) Interventions SDOH Screenings   Food Insecurity: No Food Insecurity (05/22/2023)  Housing: Low Risk  (05/22/2023)  Transportation Needs: No Transportation Needs (05/22/2023)  Utilities: Not At Risk (05/22/2023)  Depression (PHQ2-9): Low Risk  (01/17/2023)  Financial Resource Strain: Low Risk  (05/18/2023)   Received from [facility]  Social Connections: Socially Isolated (05/22/2023)  Tobacco Use: Low Risk  (05/22/2023)    Readmission Risk Interventions     No data to display

## 2023-05-29 NOTE — TOC Progression Note (Signed)
 Transition of Care [facility]) - Progression Note    Patient Details  Name: Teresa Johns MRN: [id number] Date of Birth: 08-Jun-1934  Transition [facility] Care [facility]) CM/SW Contact  Marlowe Sax, RN Phone Number: 05/29/2023, 3:29 PM  Clinical Narrative:     Spoke with [facility], they stated that [facility] is not in network I explained that I was told that the case ID [facility] was supposed to be with the Medical Director and a decision was supposed to be made in 24-48 hours, she stated that they will call [facility] and see if they can make a bed offer I called Her Grand daughter and explained what was said I called Tammy Sours the Son to see if they will accept a bed offer from [facility] They are agreeable to go to [facility],  [place], I requested [name] at [facility] to start Ins process for approval       Expected Discharge Plan and Services         Expected Discharge Date: 05/29/23                                     Social Determinants of Health (SDOH) Interventions SDOH Screenings   Food Insecurity: No Food Insecurity (05/22/2023)  Housing: Low Risk  (05/22/2023)  Transportation Needs: No Transportation Needs (05/22/2023)  Utilities: Not At Risk (05/22/2023)  Depression (PHQ2-9): Low Risk  (01/17/2023)  Financial Resource Strain: Low Risk  (05/18/2023)   Received from [facility]  Social Connections: Socially Isolated (05/22/2023)  Tobacco Use: Low Risk  (05/22/2023)    Readmission Risk Interventions     No data to display

## 2023-05-29 NOTE — Progress Notes (Signed)
   Subjective: 7 Days Post-Op Procedure(s) (LRB): ARTHROPLASTY, KNEE, TOTAL (Right) Patient reports pain as mild.   Patient is well, and has had no acute complaints or problems Denies any CP, SOB, ABD pain.  No nausea vomiting.  We will continue therapy today.  Plan is to go Skilled nursing facility after hospital stay.  Objective: Vital signs in last 24 hours: Temp:  [97.8 F (36.6 C)-98.3 F (36.8 C)] 97.8 F (36.6 C) (03/24 0826) Pulse Rate:  [68-79] 79 (03/24 0827) Resp:  [16-18] 18 (03/24 0430) BP: (132-145)/(55-77) 132/55 (03/24 0827) SpO2:  [98 %-100 %] 98 % (03/24 0827)  Intake/Output from previous day: 03/23 0701 - 03/24 0700 In: 720 [P.O.:720] Out: 800 [Urine:800] Intake/Output this shift: Total I/O In: 240 [P.O.:240] Out: -   No results for input(s): "HGB" in the last 72 hours.  No results for input(s): "WBC", "RBC", "HCT", "PLT" in the last 72 hours.  No results for input(s): "NA", "K", "CL", "CO2", "BUN", "CREATININE", "GLUCOSE", "CALCIUM" in the last 72 hours.  No results for input(s): "LABPT", "INR" in the last 72 hours.  EXAM General - Patient is Alert, Appropriate, and Oriented Extremity - Neurovascular intact Sensation intact distally Intact pulses distally Dorsiflexion/Plantar flexion intact -Homans' sign bilaterally Dressing - dressing C/D/I and no drainage Motor Function - intact, moving foot and toes well on exam.   Past Medical History:  Diagnosis Date   Adrenal adenoma, left    benign   Anemia    CKD (chronic kidney disease) stage 4, GFR 15-29 ml/min (HCC)    Colitis    Complication of anesthesia    bp dropped during EGD/during hand surgery bp became elevated   GERD (gastroesophageal reflux disease)    Helicobacter pylori gastritis    Hiatal hernia    History of chicken pox    History of Helicobacter pylori infection    History of rectal bleeding    Left renal mass    Low vitamin D level    Neuropathy    Osteoporosis     Presbyesophagus    Primary hypertension    Primary osteoarthritis involving multiple joints    Renal insufficiency    Seasonal allergies    Spinal stenosis of lumbar region    Tinnitus     Assessment/Plan:   7 Days Post-Op Procedure(s) (LRB): ARTHROPLASTY, KNEE, TOTAL (Right) Principal Problem:   S/P total knee arthroplasty, right Active Problems:   Status post total knee replacement using cement, right  Estimated body mass index is 28.87 kg/m as calculated from the following:   Height as of this encounter: 5\' 3"  (1.6 m).   Weight as of this encounter: 73.9 kg. Advance diet Up with therapy Pain well controlled Vital signs are stable Slow progress of physical therapy CM to assist with discharge to SNF, hopefully Today   DVT Prophylaxis - Lovenox, TED hose, and SCDs Weight-Bearing as tolerated to right leg   T. Cranston Neighbor, PA-C [facility] 05/29/2023, 10:55 AM

## 2023-05-29 NOTE — Progress Notes (Signed)
 Physical Therapy Treatment Patient Details Name: Teresa Johns MRN: [id number] DOB: [birth year] Today's Date: 05/29/2023   History of Present Illness Patient is a 89 year old with right knee osteoarthritis. S/p right total knee arthroplasty. History of back surgery    PT Comments  Pt stood and walked 110' with RW and cga/min a x 1 which is consistent today with AM session.  Participated in exercises as described below.  Pt progressing well with mobility but continues to need +1 for safety and exercise direction and stretching.  She needs some support and cues for transfers and hand placements.     If plan is discharge home, recommend the following: Assistance with cooking/housework;Direct supervision/assist for medications management;Direct supervision/assist for financial management;Assist for transportation;Help with stairs or ramp for entrance;Supervision due to cognitive status;A little help with walking and/or transfers;A little help with bathing/dressing/bathroom   Can travel by private vehicle        Equipment Recommendations  Rolling walker (2 wheels);BSC/3in1    Recommendations for Other Services       Precautions / Restrictions Precautions Precautions: Fall;Knee Precaution Booklet Issued: Yes (comment) Recall of Precautions/Restrictions: Intact Restrictions Weight Bearing Restrictions Per Provider Order: Yes RLE Weight Bearing Per Provider Order: Weight bearing as tolerated     Mobility  Bed Mobility               General bed mobility comments: NT, pt in recliner pre/post session. Patient Response: Cooperative  Transfers Overall transfer level: Needs assistance Equipment used: Rolling walker (2 wheels) Transfers: Sit to/from Stand Sit to Stand: Min assist                Ambulation/Gait Ambulation/Gait assistance: Contact guard assist, Min assist Gait Distance (Feet): 110 Feet Assistive device: Rolling walker (2 wheels) Gait Pattern/deviations:  Step-to pattern, Step-through pattern, Ataxic, Trunk flexed Gait velocity: decreased     General Gait Details: consistant gait with AM and PM session today   Stairs             Wheelchair Mobility     Tilt Bed Tilt Bed Patient Response: Cooperative  Modified Rankin (Stroke Patients Only)       Balance Overall balance assessment: Needs assistance Sitting-balance support: Feet supported Sitting balance-Leahy Scale: Good     Standing balance support: Bilateral upper extremity supported Standing balance-Leahy Scale: Fair                              Hotel manager: No apparent difficulties  Cognition Arousal: Alert Behavior During Therapy: WFL for tasks assessed/performed   PT - Cognitive impairments: History of cognitive impairments, Orientation, Awareness, Problem solving, Safety/Judgement                         Following commands: Intact      Cueing Cueing Techniques: Verbal cues, Tactile cues  Exercises Total Joint Exercises Goniometric ROM: 4-92 Other Exercises Other Exercises: long sitting ex in recliner for RLE x 10    General Comments        Pertinent Vitals/Pain Pain Assessment Pain Assessment: Faces Faces Pain Scale: Hurts little more Pain Location: R knee Pain Descriptors / Indicators: Discomfort, Stabbing Pain Intervention(s): Limited activity within patient's tolerance, Monitored during session, Repositioned, Ice applied    Home Living  Prior Function            PT Goals (current goals can now be found in the care plan section) Progress towards PT goals: Progressing toward goals    Frequency    BID      PT Plan      Co-evaluation              AM-PAC PT "6 Clicks" Mobility   Outcome Measure  Help needed turning from your back to your side while in a flat bed without using bedrails?: A Little Help needed moving from lying on your  back to sitting on the side of a flat bed without using bedrails?: A Little Help needed moving to and from a bed to a chair (including a wheelchair)?: A Little Help needed standing up from a chair using your arms (e.g., wheelchair or bedside chair)?: A Little Help needed to walk in hospital room?: A Little Help needed climbing 3-5 steps with a railing? : A Lot 6 Click Score: 17    End of Session Equipment Utilized During Treatment: Gait belt Activity Tolerance: Patient limited by pain Patient left: in chair;with call bell/phone within reach;with chair alarm set;with family/visitor present Nurse Communication: Mobility status PT Visit Diagnosis: Difficulty in walking, not elsewhere classified (R26.2);Other abnormalities of gait and mobility (R26.89)     Time: 1349-1405 PT Time Calculation (min) (ACUTE ONLY): 16 min  Charges:    $Gait Training: 8-22 mins PT General Charges $$ ACUTE PT VISIT: 1 Visit                   Danielle Dess, PTA 05/29/23, 2:26 PM

## 2023-05-29 NOTE — Progress Notes (Signed)
 Physical Therapy Treatment Patient Details Name: Teresa Johns MRN: [id number] DOB: April 07, 1934 Today's Date: 05/29/2023   History of Present Illness Patient is a 89 year old with right knee osteoarthritis. S/p right total knee arthroplasty. History of back surgery    PT Comments  In chair.  She c/o some increased pain today.  Is able to stand with min a x 1 and walk 110' with RW and cga x 1.  To bathroom where she struggles to sit on commode without raised seat.  She then walks out to [facility] and is able to have medium formed BM.  Returns to chair for seated ex and stretching but limited by general fatigue.     If plan is discharge home, recommend the following: Assistance with cooking/housework;Direct supervision/assist for medications management;Direct supervision/assist for financial management;Assist for transportation;Help with stairs or ramp for entrance;Supervision due to cognitive status;A little help with walking and/or transfers;A little help with bathing/dressing/bathroom   Can travel by private vehicle        Equipment Recommendations  Rolling walker (2 wheels);BSC/3in1    Recommendations for Other Services       Precautions / Restrictions Precautions Precautions: Fall;Knee Precaution Booklet Issued: Yes (comment) Recall of Precautions/Restrictions: Intact Restrictions Weight Bearing Restrictions Per Provider Order: Yes RLE Weight Bearing Per Provider Order: Weight bearing as tolerated     Mobility  Bed Mobility               General bed mobility comments: NT, pt in recliner pre/post session. Patient Response: Cooperative  Transfers Overall transfer level: Needs assistance Equipment used: Rolling walker (2 wheels) Transfers: Sit to/from Stand Sit to Stand: Min assist                Ambulation/Gait Ambulation/Gait assistance: Contact guard assist, Min assist Gait Distance (Feet): 110 Feet Assistive device: Rolling walker (2 wheels) Gait  Pattern/deviations: Step-to pattern, Step-through pattern, Ataxic, Trunk flexed Gait velocity: decreased         Stairs             Wheelchair Mobility     Tilt Bed Tilt Bed Patient Response: Cooperative  Modified Rankin (Stroke Patients Only)       Balance Overall balance assessment: Needs assistance Sitting-balance support: Feet supported Sitting balance-Leahy Scale: Good     Standing balance support: Bilateral upper extremity supported Standing balance-Leahy Scale: Fair                              Hotel manager: No apparent difficulties  Cognition Arousal: Alert Behavior During Therapy: WFL for tasks assessed/performed   PT - Cognitive impairments: History of cognitive impairments, Orientation, Awareness, Problem solving, Safety/Judgement                         Following commands: Intact      Cueing Cueing Techniques: Verbal cues, Tactile cues  Exercises Total Joint Exercises Goniometric ROM: 4-92 Other Exercises Other Exercises: seated AROM after gait/toiletting    General Comments        Pertinent Vitals/Pain Pain Assessment Pain Assessment: Faces Faces Pain Scale: Hurts little more Pain Location: R knee Pain Descriptors / Indicators: Discomfort, Stabbing Pain Intervention(s): Limited activity within patient's tolerance, Monitored during session, Repositioned, Ice applied    Home Living  Prior Function            PT Goals (current goals can now be found in the care plan section) Progress towards PT goals: Progressing toward goals    Frequency    BID      PT Plan      Co-evaluation              AM-PAC PT "6 Clicks" Mobility   Outcome Measure  Help needed turning from your back to your side while in a flat bed without using bedrails?: A Little Help needed moving from lying on your back to sitting on the side of a flat bed without  using bedrails?: A Little Help needed moving to and from a bed to a chair (including a wheelchair)?: A Little Help needed standing up from a chair using your arms (e.g., wheelchair or bedside chair)?: A Little Help needed to walk in hospital room?: A Little Help needed climbing 3-5 steps with a railing? : A Lot 6 Click Score: 17    End of Session Equipment Utilized During Treatment: Gait belt Activity Tolerance: Patient limited by pain Patient left: in chair;with call bell/phone within reach;with chair alarm set;with family/visitor present Nurse Communication: Mobility status PT Visit Diagnosis: Difficulty in walking, not elsewhere classified (R26.2);Other abnormalities of gait and mobility (R26.89)     Time: 1610-9604 PT Time Calculation (min) (ACUTE ONLY): 22 min  Charges:    $Gait Training: 8-22 mins PT General Charges $$ ACUTE PT VISIT: 1 Visit                   Danielle Dess, PTA 05/29/23, 1:20 PM

## 2023-05-30 MED ORDER — DIPHENHYDRAMINE HCL 25 MG PO CAPS
25.0000 mg | ORAL_CAPSULE | Freq: Four times a day (QID) | ORAL | Status: DC | PRN
  Administered 2023-05-30: 25 mg via ORAL
  Filled 2023-05-30: qty 1

## 2023-05-30 NOTE — Plan of Care (Signed)

## 2023-05-30 NOTE — Progress Notes (Signed)
 Physical Therapy Treatment Patient Details Name: Teresa Johns MRN: [id number] DOB: 08/24/1934 Today's Date: 05/30/2023   History of Present Illness Patient is a 89 year old with right knee osteoarthritis. S/p right total knee arthroplasty. History of back surgery    PT Comments  Pt ready for session after medicated.  She stands and walks to BR then walks 120' in hallway, opts to return to bed after session.  Insurance has not been approved at this time.  Discussed with son and pt back up plan in case it is not available.  Son stated they are unable to provide care at this time due to work schedules.  Pt needs +1 assist for general mobility and safety.   If plan is discharge home, recommend the following: Assistance with cooking/housework;Direct supervision/assist for medications management;Direct supervision/assist for financial management;Assist for transportation;Help with stairs or ramp for entrance;Supervision due to cognitive status;A little help with walking and/or transfers;A little help with bathing/dressing/bathroom   Can travel by private vehicle        Equipment Recommendations  Rolling walker (2 wheels);BSC/3in1    Recommendations for Other Services       Precautions / Restrictions Precautions Precautions: Fall;Knee Precaution Booklet Issued: Yes (comment) Recall of Precautions/Restrictions: Intact Restrictions Weight Bearing Restrictions Per Provider Order: Yes RLE Weight Bearing Per Provider Order: Weight bearing as tolerated     Mobility  Bed Mobility Overal bed mobility: Needs Assistance Bed Mobility: Sit to Supine       Sit to supine: Min assist, Mod assist   General bed mobility comments: NT, pt in recliner pre/post session. Patient Response: Cooperative  Transfers Overall transfer level: Needs assistance Equipment used: Rolling walker (2 wheels) Transfers: Sit to/from Stand Sit to Stand: Min assist                 Ambulation/Gait Ambulation/Gait assistance: Contact guard assist, Min assist Gait Distance (Feet): 120 Feet Assistive device: Rolling walker (2 wheels) Gait Pattern/deviations: Step-to pattern, Step-through pattern, Ataxic, Trunk flexed Gait velocity: decreased     General Gait Details: consistant gait with AM and PM session today   Stairs             Wheelchair Mobility     Tilt Bed Tilt Bed Patient Response: Cooperative  Modified Rankin (Stroke Patients Only)       Balance Overall balance assessment: Needs assistance Sitting-balance support: Feet supported Sitting balance-Leahy Scale: Good     Standing balance support: Bilateral upper extremity supported Standing balance-Leahy Scale: Fair                              Hotel manager: No apparent difficulties  Cognition Arousal: Alert Behavior During Therapy: WFL for tasks assessed/performed   PT - Cognitive impairments: History of cognitive impairments, Orientation, Awareness, Problem solving, Safety/Judgement                         Following commands: Intact      Cueing Cueing Techniques: Verbal cues, Tactile cues  Exercises Total Joint Exercises Goniometric ROM: 4-92 limited by discomfort Other Exercises Other Exercises: seated AROM and stretching, to BSC to void    General Comments        Pertinent Vitals/Pain Pain Assessment Pain Assessment: Faces Faces Pain Scale: Hurts little more Pain Location: R knee Pain Descriptors / Indicators: Discomfort, Stabbing Pain Intervention(s): Limited activity within patient's tolerance, Monitored during session, Premedicated  before session, Ice applied, Repositioned    Home Living                          Prior Function            PT Goals (current goals can now be found in the care plan section) Progress towards PT goals: Progressing toward goals    Frequency    BID      PT  Plan      Co-evaluation              AM-PAC PT "6 Clicks" Mobility   Outcome Measure  Help needed turning from your back to your side while in a flat bed without using bedrails?: A Little Help needed moving from lying on your back to sitting on the side of a flat bed without using bedrails?: A Little Help needed moving to and from a bed to a chair (including a wheelchair)?: A Little Help needed standing up from a chair using your arms (e.g., wheelchair or bedside chair)?: A Little Help needed to walk in hospital room?: A Little Help needed climbing 3-5 steps with a railing? : A Lot 6 Click Score: 17    End of Session Equipment Utilized During Treatment: Gait belt Activity Tolerance: Patient limited by pain Patient left: in chair;with call bell/phone within reach;with chair alarm set;with family/visitor present Nurse Communication: Mobility status PT Visit Diagnosis: Difficulty in walking, not elsewhere classified (R26.2);Other abnormalities of gait and mobility (R26.89)     Time: 1254-1311 PT Time Calculation (min) (ACUTE ONLY): 17 min  Charges:    $Gait Training: 8-22 mins PT General Charges $$ ACUTE PT VISIT: 1 Visit                   Danielle Dess, PTA 05/30/23, 1:23 PM

## 2023-05-30 NOTE — Plan of Care (Signed)
  Problem: Education: Goal: Knowledge of the prescribed therapeutic regimen will improve Outcome: Progressing Goal: Individualized Educational Video(s) Outcome: Progressing   Problem: Activity: Goal: Ability to avoid complications of mobility impairment will improve Outcome: Progressing Goal: Range of joint motion will improve Outcome: Progressing   Problem: Clinical Measurements: Goal: Postoperative complications will be avoided or minimized Outcome: Progressing   Problem: Pain Management: Goal: Pain level will decrease with appropriate interventions Outcome: Progressing   Problem: Skin Integrity: Goal: Will show signs of wound healing Outcome: Progressing   Problem: Education: Goal: Knowledge of General Education information will improve Description: Including pain rating scale, medication(s)/side effects and non-pharmacologic comfort measures Outcome: Progressing   Problem: Health Behavior/Discharge Planning: Goal: Ability to manage health-related needs will improve Outcome: Progressing   Problem: Clinical Measurements: Goal: Ability to maintain clinical measurements within normal limits will improve Outcome: Progressing Goal: Will remain free from infection Outcome: Progressing Goal: Diagnostic test results will improve Outcome: Progressing Goal: Respiratory complications will improve Outcome: Progressing Goal: Cardiovascular complication will be avoided Outcome: Progressing   Problem: Activity: Goal: Risk for activity intolerance will decrease Outcome: Progressing   Problem: Nutrition: Goal: Adequate nutrition will be maintained Outcome: Progressing   Problem: Coping: Goal: Level of anxiety will decrease Outcome: Progressing   Problem: Elimination: Goal: Will not experience complications related to bowel motility Outcome: Progressing Goal: Will not experience complications related to urinary retention Outcome: Progressing   Problem: Pain  Managment: Goal: General experience of comfort will improve and/or be controlled Outcome: Progressing   Problem: Safety: Goal: Ability to remain free from injury will improve Outcome: Progressing   Problem: Skin Integrity: Goal: Risk for impaired skin integrity will decrease Outcome: Progressing

## 2023-05-30 NOTE — Progress Notes (Signed)
 Physical Therapy Treatment Patient Details Name: Teresa Johns MRN: [id number] DOB: 09/05/1934 Today's Date: 05/30/2023   History of Present Illness Patient is a 89 year old with right knee osteoarthritis. S/p right total knee arthroplasty. History of back surgery    PT Comments  Completed gait,BSC to void and seated ex/stretching with min a x 1 .  She continued to need general safety cues for hand placements with transfers and keeping walker with her/fully turning to seated surface before sitting.  She is progressing well towards goal and overall mobility but will benefit from <3 hrs a day therapies upon discharge.     If plan is discharge home, recommend the following: Assistance with cooking/housework;Direct supervision/assist for medications management;Direct supervision/assist for financial management;Assist for transportation;Help with stairs or ramp for entrance;Supervision due to cognitive status;A little help with walking and/or transfers;A little help with bathing/dressing/bathroom   Can travel by private vehicle        Equipment Recommendations  Rolling walker (2 wheels);BSC/3in1    Recommendations for Other Services       Precautions / Restrictions Precautions Precautions: Fall;Knee Precaution Booklet Issued: Yes (comment) Recall of Precautions/Restrictions: Intact Restrictions Weight Bearing Restrictions Per Provider Order: Yes RLE Weight Bearing Per Provider Order: Weight bearing as tolerated     Mobility  Bed Mobility               General bed mobility comments: NT, pt in recliner pre/post session. Patient Response: Cooperative  Transfers Overall transfer level: Needs assistance Equipment used: Rolling walker (2 wheels) Transfers: Sit to/from Stand Sit to Stand: Min assist                Ambulation/Gait Ambulation/Gait assistance: Contact guard assist, Min assist Gait Distance (Feet): 120 Feet Assistive device: Rolling walker (2 wheels) Gait  Pattern/deviations: Step-to pattern, Step-through pattern, Ataxic, Trunk flexed Gait velocity: decreased     General Gait Details: consistant gait with AM and PM session today   Stairs             Wheelchair Mobility     Tilt Bed Tilt Bed Patient Response: Cooperative  Modified Rankin (Stroke Patients Only)       Balance Overall balance assessment: Needs assistance Sitting-balance support: Feet supported Sitting balance-Leahy Scale: Good     Standing balance support: Bilateral upper extremity supported Standing balance-Leahy Scale: Fair                              Hotel manager: No apparent difficulties  Cognition Arousal: Alert Behavior During Therapy: WFL for tasks assessed/performed   PT - Cognitive impairments: History of cognitive impairments, Orientation, Awareness, Problem solving, Safety/Judgement                         Following commands: Intact      Cueing Cueing Techniques: Verbal cues, Tactile cues  Exercises Total Joint Exercises Goniometric ROM: 4-92 limited by discomfort Other Exercises Other Exercises: seated AROM and stretching, to BSC to void    General Comments        Pertinent Vitals/Pain Pain Assessment Pain Assessment: Faces Faces Pain Scale: Hurts a little bit Pain Location: R knee Pain Descriptors / Indicators: Discomfort, Stabbing Pain Intervention(s): Limited activity within patient's tolerance, Monitored during session    Home Living  Prior Function            PT Goals (current goals can now be found in the care plan section) Progress towards PT goals: Progressing toward goals    Frequency    BID      PT Plan      Co-evaluation              AM-PAC PT "6 Clicks" Mobility   Outcome Measure  Help needed turning from your back to your side while in a flat bed without using bedrails?: A Little Help needed moving  from lying on your back to sitting on the side of a flat bed without using bedrails?: A Little Help needed moving to and from a bed to a chair (including a wheelchair)?: A Little Help needed standing up from a chair using your arms (e.g., wheelchair or bedside chair)?: A Little Help needed to walk in hospital room?: A Little Help needed climbing 3-5 steps with a railing? : A Lot 6 Click Score: 17    End of Session Equipment Utilized During Treatment: Gait belt Activity Tolerance: Patient limited by pain Patient left: in chair;with call bell/phone within reach;with chair alarm set;with family/visitor present Nurse Communication: Mobility status PT Visit Diagnosis: Difficulty in walking, not elsewhere classified (R26.2);Other abnormalities of gait and mobility (R26.89)     Time: 4098-1191 PT Time Calculation (min) (ACUTE ONLY): 21 min  Charges:    $Gait Training: 8-22 mins PT General Charges $$ ACUTE PT VISIT: 1 Visit                   Danielle Dess, PTA 05/30/23, 8:54 AM

## 2023-05-30 NOTE — TOC Progression Note (Signed)
 Transition of Care [facility]) - Progression Note    Patient Details  Name: Teresa Johns MRN: [id number] Date of Birth: 30-May-1934  [facility] of [facility]) CM/SW Contact  Marlowe Sax, RN Phone Number: 05/30/2023, 11:15 AM  Clinical Narrative:     Spoke to [employer] and I explained that Ins is pending to go to [facility], she stated that they will deny the request to go to Out of network facility       Expected Discharge Plan and Services         Expected Discharge Date: 05/29/23                                     Social Determinants of Health (SDOH) Interventions SDOH Screenings   Food Insecurity: No Food Insecurity (05/29/2023)  Housing: Low Risk  (05/29/2023)  Transportation Needs: No Transportation Needs (05/29/2023)  Utilities: Not At Risk (05/29/2023)  Depression (PHQ2-9): Low Risk  (01/17/2023)  Financial Resource Strain: Low Risk  (05/18/2023)   Received from [facility]  Social Connections: Socially Isolated (05/29/2023)  Tobacco Use: Low Risk  (05/22/2023)    Readmission Risk Interventions     No data to display

## 2023-05-30 NOTE — Progress Notes (Signed)
   Subjective: 8 Days Post-Op Procedure(s) (LRB): ARTHROPLASTY, KNEE, TOTAL (Right) Patient reports pain as mild.   Patient is well, and has had no acute complaints or problems Denies any CP, SOB, ABD pain.  No nausea vomiting.  We will continue therapy today.  Plan is to go Skilled nursing facility after hospital stay.  Objective: Vital signs in last 24 hours: Temp:  [97.3 F (36.3 C)-98.2 F (36.8 C)] 98 F (36.7 C) (03/25 0906) Pulse Rate:  [75-90] 90 (03/25 0906) Resp:  [17] 17 (03/25 0906) BP: (122-143)/(44-68) 122/44 (03/25 0906) SpO2:  [97 %-100 %] 99 % (03/25 0906) Weight:  [73.9 kg] 73.9 kg (03/24 1951)  Intake/Output from previous day: 03/24 0701 - 03/25 0700 In: 480 [P.O.:480] Out: -  Intake/Output this shift: Total I/O In: 240 [P.O.:240] Out: -   No results for input(s): "HGB" in the last 72 hours.  No results for input(s): "WBC", "RBC", "HCT", "PLT" in the last 72 hours.  No results for input(s): "NA", "K", "CL", "CO2", "BUN", "CREATININE", "GLUCOSE", "CALCIUM" in the last 72 hours.  No results for input(s): "LABPT", "INR" in the last 72 hours.  EXAM General - Patient is Alert, Appropriate, and Oriented Extremity - Neurovascular intact Sensation intact distally Intact pulses distally Dorsiflexion/Plantar flexion intact -Homans' sign bilaterally Dressing - dressing C/D/I and no drainage Motor Function - intact, moving foot and toes well on exam.   Past Medical History:  Diagnosis Date   Adrenal adenoma, left    benign   Anemia    CKD (chronic kidney disease) stage 4, GFR 15-29 ml/min (HCC)    Colitis    Complication of anesthesia    bp dropped during EGD/during hand surgery bp became elevated   GERD (gastroesophageal reflux disease)    Helicobacter pylori gastritis    Hiatal hernia    History of chicken pox    History of Helicobacter pylori infection    History of rectal bleeding    Left renal mass    Low vitamin D level    Neuropathy     Osteoporosis    Presbyesophagus    Primary hypertension    Primary osteoarthritis involving multiple joints    Renal insufficiency    Seasonal allergies    Spinal stenosis of lumbar region    Tinnitus     Assessment/Plan:   8 Days Post-Op Procedure(s) (LRB): ARTHROPLASTY, KNEE, TOTAL (Right) Principal Problem:   S/P total knee arthroplasty, right Active Problems:   Status post total knee replacement using cement, right  Estimated body mass index is 28.87 kg/m as calculated from the following:   Height as of this encounter: 5\' 3"  (1.6 m).   Weight as of this encounter: 73.9 kg. Advance diet Up with therapy Pain well controlled Vital signs are stable Slow progress with physical therapy CM to assist with discharge to SNF   DVT Prophylaxis - Lovenox, TED hose, and SCDs Weight-Bearing as tolerated to right leg   T. Cranston Neighbor, PA-C [facility] 05/30/2023, 10:50 AM

## 2023-05-31 NOTE — Progress Notes (Addendum)
 Patient returned to room and d/c cancelled due to "facility issue". TOC to call family and give them an update.   Addendum: CN given update.

## 2023-05-31 NOTE — TOC Transition Note (Signed)
 Transition of Care [facility]) - Discharge Note   Patient Details  Name: Teresa Johns MRN: [id number] Date of Birth: Aug 28, 1934  Transition of Care [facility]) CM/SW Contact:  Marlowe Sax, RN Phone Number: 05/31/2023, 11:53 AM   Clinical Narrative:     Patient going to room 317P at [facility], her family to transport her, Nurse to call report        Patient Goals and CMS Choice            Discharge Placement                       Discharge Plan and Services Additional resources added to the After Visit Summary for                                       Social Drivers of Health (SDOH) Interventions SDOH Screenings   Food Insecurity: No Food Insecurity (05/29/2023)  Housing: Low Risk  (05/29/2023)  Transportation Needs: No Transportation Needs (05/29/2023)  Utilities: Not At Risk (05/29/2023)  Depression (PHQ2-9): Low Risk  (01/17/2023)  Financial Resource Strain: Low Risk  (05/18/2023)   Received from [facility]  Social Connections: Socially Isolated (05/29/2023)  Tobacco Use: Low Risk  (05/22/2023)     Readmission Risk Interventions     No data to display

## 2023-05-31 NOTE — TOC Progression Note (Addendum)
 Transition of Care [facility]) - Progression Note    Patient Details  Name: Teresa Johns MRN: [id number] Date of Birth: October 28, 1934  [facility] Care [facility]) CM/SW Contact  Marlowe Sax, RN Phone Number: 05/31/2023, 8:32 AM  Clinical Narrative:     Received a call from [facility] asking for a call back about the authorization for this patient to go to [facility], I called back [phone] and left a VM asking for a call back   Update, UM nurse with [facility] called me back to let me know that they are going to process the request, I explained that the patient has been in the hospital far too long waiting on this Ins company She stated she will ask for it to be rushed    Expected Discharge Plan and Services         Expected Discharge Date: 05/29/23                                     Social Determinants of Health (SDOH) Interventions SDOH Screenings   Food Insecurity: No Food Insecurity (05/29/2023)  Housing: Low Risk  (05/29/2023)  Transportation Needs: No Transportation Needs (05/29/2023)  Utilities: Not At Risk (05/29/2023)  Depression (PHQ2-9): Low Risk  (01/17/2023)  Financial Resource Strain: Low Risk  (05/18/2023)   Received from [facility]  Social Connections: Socially Isolated (05/29/2023)  Tobacco Use: Low Risk  (05/22/2023)    Readmission Risk Interventions     No data to display

## 2023-05-31 NOTE — TOC Progression Note (Signed)
 Transition of Care [facility]) - Progression Note    Patient Details  Name: Teresa Johns MRN: [id number] Date of Birth: [birth year]  Transition of Care [facility]) CM/SW Contact  Marlowe Sax, RN Phone Number: 05/31/2023, 9:57 AM  Clinical Narrative:     [phone] to go to [facility] Approved 3/26-4/2       Expected Discharge Plan and Services         Expected Discharge Date: 05/29/23                                     Social Determinants of Health (SDOH) Interventions SDOH Screenings   Food Insecurity: No Food Insecurity (05/29/2023)  Housing: Low Risk  (05/29/2023)  Transportation Needs: No Transportation Needs (05/29/2023)  Utilities: Not At Risk (05/29/2023)  Depression (PHQ2-9): Low Risk  (01/17/2023)  Financial Resource Strain: Low Risk  (05/18/2023)   Received from [facility]  Social Connections: Socially Isolated (05/29/2023)  Tobacco Use: Low Risk  (05/22/2023)    Readmission Risk Interventions     No data to display

## 2023-05-31 NOTE — Progress Notes (Signed)
   Subjective: 9 Days Post-Op Procedure(s) (LRB): ARTHROPLASTY, KNEE, TOTAL (Right) Patient reports pain as mild.   Patient is well, and has had no acute complaints or problems Denies any CP, SOB, ABD pain.  No nausea vomiting.  We will continue therapy today.  Plan is to go Skilled nursing facility after hospital stay.  Objective: Vital signs in last 24 hours: Temp:  [97.9 F (36.6 C)-98.3 F (36.8 C)] 97.9 F (36.6 C) (03/26 0744) Pulse Rate:  [71-94] 72 (03/26 0744) Resp:  [16-20] 16 (03/26 0744) BP: (125-138)/(54-62) 125/54 (03/26 0744) SpO2:  [97 %-100 %] 97 % (03/26 0744)  Intake/Output from previous day: 03/25 0701 - 03/26 0700 In: 360 [P.O.:360] Out: -  Intake/Output this shift: No intake/output data recorded.  No results for input(s): "HGB" in the last 72 hours.  No results for input(s): "WBC", "RBC", "HCT", "PLT" in the last 72 hours.  No results for input(s): "NA", "K", "CL", "CO2", "BUN", "CREATININE", "GLUCOSE", "CALCIUM" in the last 72 hours.  No results for input(s): "LABPT", "INR" in the last 72 hours.  EXAM General - Patient is Alert, Appropriate, and Oriented Extremity - Neurovascular intact Sensation intact distally Intact pulses distally Dorsiflexion/Plantar flexion intact -Homans' sign bilaterally Dressing - dressing C/D/I and no drainage Motor Function - intact, moving foot and toes well on exam.   Past Medical History:  Diagnosis Date   Adrenal adenoma, left    benign   Anemia    CKD (chronic kidney disease) stage 4, GFR 15-29 ml/min (HCC)    Colitis    Complication of anesthesia    bp dropped during EGD/during hand surgery bp became elevated   GERD (gastroesophageal reflux disease)    Helicobacter pylori gastritis    Hiatal hernia    History of chicken pox    History of Helicobacter pylori infection    History of rectal bleeding    Left renal mass    Low vitamin D level    Neuropathy    Osteoporosis    Presbyesophagus     Primary hypertension    Primary osteoarthritis involving multiple joints    Renal insufficiency    Seasonal allergies    Spinal stenosis of lumbar region    Tinnitus     Assessment/Plan:   9 Days Post-Op Procedure(s) (LRB): ARTHROPLASTY, KNEE, TOTAL (Right) Principal Problem:   S/P total knee arthroplasty, right Active Problems:   Status post total knee replacement using cement, right  Estimated body mass index is 28.87 kg/m as calculated from the following:   Height as of this encounter: 5\' 3"  (1.6 m).   Weight as of this encounter: 73.9 kg. Advance diet Up with therapy Pain well controlled Vital signs are stable Slow progress with physical therapy CM to assist with discharge to SNF   DVT Prophylaxis - Lovenox, TED hose, and SCDs Weight-Bearing as tolerated to right leg   T. Cranston Neighbor, PA-C [facility] 05/31/2023, 9:13 AM

## 2023-05-31 NOTE — Plan of Care (Signed)
  Problem: Activity: Goal: Ability to avoid complications of mobility impairment will improve Outcome: Progressing   

## 2023-05-31 NOTE — TOC Progression Note (Addendum)
 Transition of Care [facility]) - Progression Note    Patient Details  Name: Teresa Johns MRN: [id number] Date of Birth: 03-Jul-1934  Transition of [facility]) CM/SW Contact  Marlowe Sax, RN Phone Number: 05/31/2023, 10:03 AM  Clinical Narrative:      Spoke with Inis Sizer, I explained we have Ins approval and the family will need to transport to [facility] due to her being able to get out of bed, Tammy Sours stated to call him and he will arrange to leave work to take her to [facility]      Expected Discharge Plan and Services         Expected Discharge Date: 05/29/23                                     Social Determinants of Health (SDOH) Interventions SDOH Screenings   Food Insecurity: No Food Insecurity (05/29/2023)  Housing: Low Risk  (05/29/2023)  Transportation Needs: No Transportation Needs (05/29/2023)  Utilities: Not At Risk (05/29/2023)  Depression (PHQ2-9): Low Risk  (01/17/2023)  Financial Resource Strain: Low Risk  (05/18/2023)   Received from [facility]  Social Connections: Socially Isolated (05/29/2023)  Tobacco Use: Low Risk  (05/22/2023)    Readmission Risk Interventions     No data to display

## 2023-05-31 NOTE — Progress Notes (Addendum)
 Called [facility] to give report. Pt to go to room 317P  Report given to [facility], questions asked and answered. Pt to be transported by family to [facility].   Called Tammy Sours re: transport to [facility]. He states that his brother who is at the bedside can take her. Spoke with family and they will take her over. Pt transported to lobby via [facility] and taking personal car to [facility].

## 2023-05-31 NOTE — TOC Progression Note (Addendum)
 Transition of Care [facility]) - Progression Note    Patient Details  Name: Teresa Johns MRN: [id number] Date of Birth: 1934/07/25  Transition of Care [facility]) CM/SW Contact  Marlowe Sax, RN Phone Number: 05/31/2023, 12:25 PM  Clinical Narrative:   [name] at [facility] called and stated that they have had an urgent issue and they can not take her today maybe tomorrow, I called son Trey Paula to notify and was not able to reach, I called son Tammy Sours and let him know         Expected Discharge Plan and Services         Expected Discharge Date: 05/31/23                                     Social Determinants of Health (SDOH) Interventions SDOH Screenings   Food Insecurity: No Food Insecurity (05/29/2023)  Housing: Low Risk  (05/29/2023)  Transportation Needs: No Transportation Needs (05/29/2023)  Utilities: Not At Risk (05/29/2023)  Depression (PHQ2-9): Low Risk  (01/17/2023)  Financial Resource Strain: Low Risk  (05/18/2023)   Received from [facility]  Social Connections: Socially Isolated (05/29/2023)  Tobacco Use: Low Risk  (05/22/2023)    Readmission Risk Interventions     No data to display

## 2023-05-31 NOTE — Progress Notes (Signed)
 Physical Therapy Treatment Patient Details Name: Teresa Johns MRN: [id number] DOB: 1934-10-01 Today's Date: 05/31/2023   History of Present Illness Patient is a 89 year old with right knee osteoarthritis. S/p right total knee arthroplasty. History of back surgery    PT Comments  Patient alert, agreeable to PT, noted to anxious about discharge planning, did report 10/10 pain in R knee when she stood up, RN notified. The pt continued to need hands on assist for transfers, and verbal/tactile cues for safe hand placement, including during ambulation. Pt returned to room after ambulating ~113ft with RW/CGA-minA. The patient would benefit from further skilled PT intervention to continue to progress towards goals.     If plan is discharge home, recommend the following: Assistance with cooking/housework;Direct supervision/assist for medications management;Direct supervision/assist for financial management;Assist for transportation;Help with stairs or ramp for entrance;Supervision due to cognitive status;A little help with walking and/or transfers;A little help with bathing/dressing/bathroom   Can travel by private vehicle     Yes  Equipment Recommendations  Rolling walker (2 wheels);BSC/3in1    Recommendations for Other Services       Precautions / Restrictions Precautions Precautions: Fall;Knee Precaution Booklet Issued: Yes (comment) Recall of Precautions/Restrictions: Intact Restrictions Weight Bearing Restrictions Per Provider Order: Yes RLE Weight Bearing Per Provider Order: Weight bearing as tolerated     Mobility  Bed Mobility               General bed mobility comments: NT, pt in recliner pre/post session.    Transfers Overall transfer level: Needs assistance Equipment used: Rolling walker (2 wheels) Transfers: Sit to/from Stand Sit to Stand: Min assist           General transfer comment: verbal/tactile cues for safe hand placement, minA for steadying     Ambulation/Gait Ambulation/Gait assistance: Contact guard assist, Min assist Gait Distance (Feet): 120 Feet Assistive device: Rolling walker (2 wheels) Gait Pattern/deviations: Step-to pattern, Step-through pattern, Ataxic, Trunk flexed Gait velocity: decreased     General Gait Details: initially very decreased step length on RLE, did improve with time, minA for safety cues for RW use/positioning   Stairs             Wheelchair Mobility     Tilt Bed    Modified Rankin (Stroke Patients Only)       Balance Overall balance assessment: Needs assistance Sitting-balance support: Feet supported Sitting balance-Leahy Scale: Good     Standing balance support: Bilateral upper extremity supported Standing balance-Leahy Scale: Fair                              Communication    Cognition Arousal: Alert Behavior During Therapy: WFL for tasks assessed/performed, Anxious                                    Cueing    Exercises Total Joint Exercises Heel Slides: AAROM, Strengthening, Right, 10 reps Long Arc Quad: AROM, Right, 10 reps Goniometric ROM: ~90degrees today with AAROM    General Comments        Pertinent Vitals/Pain Pain Assessment Pain Assessment: 0-10 Pain Score: 10-Worst pain ever Pain Location: R knee Pain Descriptors / Indicators: Discomfort, Stabbing, Sharp Pain Intervention(s): Limited activity within patient's tolerance, Monitored during session, Repositioned, Ice applied    Home Living  Prior Function            PT Goals (current goals can now be found in the care plan section) Progress towards PT goals: Progressing toward goals    Frequency    BID      PT Plan      Co-evaluation              AM-PAC PT "6 Clicks" Mobility   Outcome Measure  Help needed turning from your back to your side while in a flat bed without using bedrails?: A Little Help needed  moving from lying on your back to sitting on the side of a flat bed without using bedrails?: A Little Help needed moving to and from a bed to a chair (including a wheelchair)?: A Little Help needed standing up from a chair using your arms (e.g., wheelchair or bedside chair)?: A Little Help needed to walk in hospital room?: A Little Help needed climbing 3-5 steps with a railing? : A Lot 6 Click Score: 17    End of Session Equipment Utilized During Treatment: Gait belt Activity Tolerance: Patient limited by pain Patient left: in chair;with call bell/phone within reach;with chair alarm set Nurse Communication: Mobility status PT Visit Diagnosis: Difficulty in walking, not elsewhere classified (R26.2);Other abnormalities of gait and mobility (R26.89)     Time: 8469-6295 PT Time Calculation (min) (ACUTE ONLY): 23 min  Charges:    $Therapeutic Exercise: 8-22 mins $Therapeutic Activity: 8-22 mins PT General Charges $$ ACUTE PT VISIT: 1 Visit                     Olga Coaster PT, DPT 9:50 AM,05/31/23

## 2023-05-31 NOTE — Progress Notes (Signed)
 Physical Therapy Treatment Patient Details Name: RHILEY TARVER MRN: [id number] DOB: [birth year] Today's Date: 05/31/2023   History of Present Illness Patient is a 89 year old with right knee osteoarthritis. S/p right total knee arthroplasty. History of back surgery    PT Comments  Patient alert, agreeable to PT, seated in recliner fully dressed due to potential discharge (cancelled due to facility reasons). She performed sit <> stand three times, minA with RW and steadying assist, as well as verbal cues for safe hand placement/technique. She was able to ambulate ~135ft, difficulty progressing distance due to endurance/activity tolerance. minA for lower body dressing/undressing, and pt without assistance at home for ADLs. Pt up in chair with needs in reach at end of session. The patient would benefit from further skilled PT intervention to continue to progress towards goals.    If plan is discharge home, recommend the following: Assistance with cooking/housework;Direct supervision/assist for medications management;Direct supervision/assist for financial management;Assist for transportation;Help with stairs or ramp for entrance;Supervision due to cognitive status;A little help with walking and/or transfers;A little help with bathing/dressing/bathroom   Can travel by private vehicle     Yes  Equipment Recommendations  Rolling walker (2 wheels);BSC/3in1    Recommendations for Other Services       Precautions / Restrictions Precautions Precautions: Fall;Knee Precaution Booklet Issued: Yes (comment) Recall of Precautions/Restrictions: Intact Restrictions Weight Bearing Restrictions Per Provider Order: Yes RLE Weight Bearing Per Provider Order: Weight bearing as tolerated     Mobility  Bed Mobility               General bed mobility comments: NT, pt in recliner pre/post session.    Transfers Overall transfer level: Needs assistance Equipment used: Rolling walker (2  wheels) Transfers: Sit to/from Stand Sit to Stand: Min assist           General transfer comment: minA for eccentric control, minA for steadying. performed three times. second rep needed less assist but still needed    Ambulation/Gait Ambulation/Gait assistance: Contact guard assist, Min assist Gait Distance (Feet): 120 Feet Assistive device: Rolling walker (2 wheels) Gait Pattern/deviations: Step-to pattern, Step-through pattern, Ataxic, Trunk flexed Gait velocity: decreased     General Gait Details: initially very decreased step length on RLE, did improve with time, minA for safety cues for RW use/positioning   Stairs             Wheelchair Mobility     Tilt Bed    Modified Rankin (Stroke Patients Only)       Balance Overall balance assessment: Needs assistance Sitting-balance support: Feet supported Sitting balance-Leahy Scale: Good     Standing balance support: Bilateral upper extremity supported Standing balance-Leahy Scale: Fair                              Hotel manager: No apparent difficulties  Cognition Arousal: Alert Behavior During Therapy: WFL for tasks assessed/performed, Anxious   PT - Cognitive impairments: History of cognitive impairments, Orientation, Awareness, Problem solving, Safety/Judgement                         Following commands: Intact      Cueing Cueing Techniques: Verbal cues, Tactile cues  Exercises Total Joint Exercises Heel Slides: AAROM, Strengthening, Right, 10 reps Long Arc Quad: AROM, Right, 10 reps Goniometric ROM: ~90degrees today with AAROM Other Exercises Other Exercises: pt able to doff upper body  clothes with supervision, extra time. able to doff socks especially normal socks. needed minA to complete lower body undressing/dressing    General Comments        Pertinent Vitals/Pain Pain Assessment Pain Assessment: 0-10 Pain Score: 10-Worst pain  ever Pain Location: R knee Pain Descriptors / Indicators: Discomfort, Stabbing, Sharp Pain Intervention(s): Limited activity within patient's tolerance, Monitored during session, Repositioned, Ice applied    Home Living                          Prior Function            PT Goals (current goals can now be found in the care plan section) Progress towards PT goals: Progressing toward goals    Frequency    BID      PT Plan      Co-evaluation              AM-PAC PT "6 Clicks" Mobility   Outcome Measure  Help needed turning from your back to your side while in a flat bed without using bedrails?: A Little Help needed moving from lying on your back to sitting on the side of a flat bed without using bedrails?: A Little Help needed moving to and from a bed to a chair (including a wheelchair)?: A Little Help needed standing up from a chair using your arms (e.g., wheelchair or bedside chair)?: A Little Help needed to walk in hospital room?: A Little Help needed climbing 3-5 steps with a railing? : A Lot 6 Click Score: 17    End of Session Equipment Utilized During Treatment: Gait belt Activity Tolerance: Patient tolerated treatment well Patient left: in chair;with call bell/phone within reach;with chair alarm set Nurse Communication: Mobility status PT Visit Diagnosis: Difficulty in walking, not elsewhere classified (R26.2);Other abnormalities of gait and mobility (R26.89)     Time: 0272-5366 PT Time Calculation (min) (ACUTE ONLY): 20 min  Charges:    $Therapeutic Activity: 8-22 mins PT General Charges $$ ACUTE PT VISIT: 1 Visit                     Olga Coaster PT, DPT 3:15 PM,05/31/23

## 2023-05-31 NOTE — Consult Note (Signed)
 [facility] Liaison Note  05/31/2023  [name] [birth year] [id number]  Location: RN Hospital Liaison screened the patient remotely at [facility].  Insurance: [employer]   Teresa Johns is a 89 y.o. female who is a Primary Care Patient of Barbette Reichmann, MD [facility]. The patient was screened for readmission hospitalization with noted low risk score for unplanned readmission risk with 2 IP in 6 months.  The patient was assessed for potential Care Management service needs for post hospital transition for care coordination. Review of patient's electronic medical record reveals patient was admitted for s/p total knee arthroplasty. Pt recommended for SNF level of care for ongoing for STR. Pt will transition today and the facility will continue to address pt's needs.   [facility] Care Management/Population Health does not replace or interfere with any arrangements made by the Inpatient Transition of Care team.   For questions contact:   Elliot Cousin, RN, BSN Hospital Liaison [place]    [facility] Office Hours MTWF  8:00 am-6:00 pm Direct Dial: [phone] mobile [email or web address]

## 2023-06-01 NOTE — Progress Notes (Signed)
 Physical Therapy Treatment Patient Details Name: Teresa Johns MRN: [id number] DOB: [birth year] Today's Date: 06/01/2023   History of Present Illness Patient is a 89 year old with right knee osteoarthritis. S/p right total knee arthroplasty. History of back surgery    PT Comments  Pt alert, seated in recliner. Pt/PT discussed importance of BLE elevation and heel propped to help TKE, PT noted each visit today and yesterday the pt has been sitting in her recliner with her feet down. Pt verbalized understanding. Overall an improvement in steadiness noted, but pt reported continued fear with transfers due to pain/instability. Several ADLs performed during session as well, CGA/minA steadying for pericare, supervision to wash and brush teeth at sink. Pt with legs elevated and RN in room at end of session. The patient would benefit from further skilled PT intervention to continue to progress towards goals.    If plan is discharge home, recommend the following: Assistance with cooking/housework;Direct supervision/assist for medications management;Direct supervision/assist for financial management;Assist for transportation;Help with stairs or ramp for entrance;Supervision due to cognitive status;A little help with walking and/or transfers;A little help with bathing/dressing/bathroom   Can travel by private vehicle     Yes  Equipment Recommendations  Rolling walker (2 wheels);BSC/3in1    Recommendations for Other Services       Precautions / Restrictions Precautions Precautions: Fall;Knee Restrictions Weight Bearing Restrictions Per Provider Order: Yes RLE Weight Bearing Per Provider Order: Weight bearing as tolerated     Mobility  Bed Mobility               General bed mobility comments: NT, pt in recliner pre/post session.    Transfers Overall transfer level: Needs assistance Equipment used: Rolling walker (2 wheels) Transfers: Sit to/from Stand Sit to Stand: Contact guard  assist, Min assist           General transfer comment: several sit <> stands, CGA for first and last repetition    Ambulation/Gait Ambulation/Gait assistance: Contact guard assist Gait Distance (Feet): 130 Feet Assistive device: Rolling walker (2 wheels)         General Gait Details: antalgia still present 1-2 standing rest breaks due to leg heaviness and bilat arm fatigue per pt   Stairs             Wheelchair Mobility     Tilt Bed    Modified Rankin (Stroke Patients Only)       Balance Overall balance assessment: Needs assistance Sitting-balance support: Feet supported Sitting balance-Leahy Scale: Good     Standing balance support: Single extremity supported Standing balance-Leahy Scale: Fair Standing balance comment: able to stand at sink and brush teeth, CGA-minA for steadying with pericare in standing                            Communication    Cognition Arousal: Alert Behavior During Therapy: WFL for tasks assessed/performed   PT - Cognitive impairments: History of cognitive impairments, Orientation, Awareness, Problem solving, Safety/Judgement                         Following commands: Intact      Cueing Cueing Techniques: Verbal cues, Tactile cues  Exercises Total Joint Exercises Heel Slides: AAROM, Strengthening, Right, 15 reps Long Arc Quad: AROM, Right, 15 reps Goniometric ROM: -2 - 90 degrees, less AAROM needed    General Comments        Pertinent Vitals/Pain  Pain Assessment Pain Assessment: 0-10 Pain Score: 7  Pain Location: R knee Pain Descriptors / Indicators: Aching, Heaviness Pain Intervention(s): Limited activity within patient's tolerance, Monitored during session, Repositioned    Home Living                          Prior Function            PT Goals (current goals can now be found in the care plan section) Progress towards PT goals: Progressing toward goals    Frequency     BID      PT Plan      Co-evaluation              AM-PAC PT "6 Clicks" Mobility   Outcome Measure  Help needed turning from your back to your side while in a flat bed without using bedrails?: A Little Help needed moving from lying on your back to sitting on the side of a flat bed without using bedrails?: A Little Help needed moving to and from a bed to a chair (including a wheelchair)?: A Little Help needed standing up from a chair using your arms (e.g., wheelchair or bedside chair)?: A Little Help needed to walk in hospital room?: A Little Help needed climbing 3-5 steps with a railing? : A Lot 6 Click Score: 17    End of Session Equipment Utilized During Treatment: Gait belt Activity Tolerance: Patient tolerated treatment well Patient left: in chair;with call bell/phone within reach;with chair alarm set;with nursing/sitter in room Nurse Communication: Mobility status PT Visit Diagnosis: Difficulty in walking, not elsewhere classified (R26.2);Other abnormalities of gait and mobility (R26.89)     Time: 1610-9604 PT Time Calculation (min) (ACUTE ONLY): 23 min  Charges:    $Therapeutic Exercise: 8-22 mins $Therapeutic Activity: 8-22 mins PT General Charges $$ ACUTE PT VISIT: 1 Visit                     Olga Coaster PT, DPT 9:33 AM,06/01/23

## 2023-06-01 NOTE — Plan of Care (Signed)
  Problem: Education: Goal: Knowledge of the prescribed therapeutic regimen will improve Outcome: Progressing   Problem: Activity: Goal: Risk for activity intolerance will decrease Outcome: Progressing   Problem: Elimination: Goal: Will not experience complications related to bowel motility Outcome: Progressing   Problem: Pain Managment: Goal: General experience of comfort will improve and/or be controlled Outcome: Progressing

## 2023-06-01 NOTE — Care Management Important Message (Signed)
 Important Message  Patient Details  Name: Teresa Johns MRN: [id number] Date of Birth: [birth year]   Important Message Given:  Yes - Medicare IM     Maron Stanzione, Stephan Minister 06/01/2023, 2:29 PM

## 2023-06-01 NOTE — Progress Notes (Signed)
   Subjective: 10 Days Post-Op Procedure(s) (LRB): ARTHROPLASTY, KNEE, TOTAL (Right) Patient reports pain as mild.   Patient is well, and has had no acute complaints or problems Denies any CP, SOB, ABD pain.  No nausea vomiting.  We will continue therapy today.  Plan is to go Skilled nursing facility after hospital stay.  Objective: Vital signs in last 24 hours: Temp:  [97.6 F (36.4 C)-98.1 F (36.7 C)] 98.1 F (36.7 C) (03/27 0839) Pulse Rate:  [81-88] 88 (03/27 0839) Resp:  [16-18] 16 (03/27 0839) BP: (124-164)/(44-74) 124/53 (03/27 0839) SpO2:  [96 %-97 %] 97 % (03/27 0839)  Intake/Output from previous day: 03/26 0701 - 03/27 0700 In: 420 [P.O.:420] Out: -  Intake/Output this shift: No intake/output data recorded.  No results for input(s): "HGB" in the last 72 hours.  No results for input(s): "WBC", "RBC", "HCT", "PLT" in the last 72 hours.  No results for input(s): "NA", "K", "CL", "CO2", "BUN", "CREATININE", "GLUCOSE", "CALCIUM" in the last 72 hours.  No results for input(s): "LABPT", "INR" in the last 72 hours.  EXAM General - Patient is Alert, Appropriate, and Oriented Extremity - Neurovascular intact Sensation intact distally Intact pulses distally Dorsiflexion/Plantar flexion intact -Homans' sign bilaterally Dressing - dressing C/D/I and no drainage Motor Function - intact, moving foot and toes well on exam.   Past Medical History:  Diagnosis Date   Adrenal adenoma, left    benign   Anemia    CKD (chronic kidney disease) stage 4, GFR 15-29 ml/min (HCC)    Colitis    Complication of anesthesia    bp dropped during EGD/during hand surgery bp became elevated   GERD (gastroesophageal reflux disease)    Helicobacter pylori gastritis    Hiatal hernia    History of chicken pox    History of Helicobacter pylori infection    History of rectal bleeding    Left renal mass    Low vitamin D level    Neuropathy    Osteoporosis    Presbyesophagus     Primary hypertension    Primary osteoarthritis involving multiple joints    Renal insufficiency    Seasonal allergies    Spinal stenosis of lumbar region    Tinnitus     Assessment/Plan:   10 Days Post-Op Procedure(s) (LRB): ARTHROPLASTY, KNEE, TOTAL (Right) Principal Problem:   S/P total knee arthroplasty, right Active Problems:   Status post total knee replacement using cement, right  Estimated body mass index is 28.87 kg/m as calculated from the following:   Height as of this encounter: 5\' 3"  (1.6 m).   Weight as of this encounter: 73.9 kg. Advance diet Up with therapy Pain well controlled Vital signs are stable Slow progress with physical therapy CM to assist with discharge to SNF   DVT Prophylaxis - Lovenox, TED hose, and SCDs Weight-Bearing as tolerated to right leg   T. Cranston Neighbor, PA-C [facility] 06/01/2023, 10:21 AM

## 2023-06-01 NOTE — TOC Progression Note (Signed)
 Transition of Care [facility]) - Progression Note    Patient Details  Name: Teresa Johns MRN: [id number] Date of Birth: 10-14-1934  Transition of Care [facility]) CM/SW Contact  Marlowe Sax, RN Phone Number: 06/01/2023, 8:54 AM  Clinical Narrative:     Reached out to [name] at [facility] to inquire if the patient can go there today, awaiting a response       Expected Discharge Plan and Services         Expected Discharge Date: 05/31/23                                     Social Determinants of Health (SDOH) Interventions SDOH Screenings   Food Insecurity: No Food Insecurity (05/29/2023)  Housing: Low Risk  (05/29/2023)  Transportation Needs: No Transportation Needs (05/29/2023)  Utilities: Not At Risk (05/29/2023)  Depression (PHQ2-9): Low Risk  (01/17/2023)  Financial Resource Strain: Low Risk  (05/18/2023)   Received from [facility]  Social Connections: Socially Isolated (05/29/2023)  Tobacco Use: Low Risk  (05/22/2023)    Readmission Risk Interventions     No data to display

## 2023-06-01 NOTE — Progress Notes (Signed)
 Physical Therapy Treatment Patient Details Name: Teresa Johns MRN: [id number] DOB: August 28, 1934 Today's Date: 06/01/2023   History of Present Illness Patient is a 89 year old with right knee osteoarthritis. S/p right total knee arthroplasty. History of back surgery    PT Comments  Patient alert, agreeable to PT, family at bedside. Pt reported increased knee stiffness, and more difficult in PM with transfers which was accurate. At least minA for successful sit <> stand despite time and chances with CGA. She ambulated ~170ft, self limited distance due to fatigue, and required minA to return to supine. A few exercises performed as well, AAROM-AROM for SLR on RLE. The patient would benefit from further skilled PT intervention to continue to progress towards goals.     If plan is discharge home, recommend the following: Assistance with cooking/housework;Direct supervision/assist for medications management;Direct supervision/assist for financial management;Assist for transportation;Help with stairs or ramp for entrance;Supervision due to cognitive status;A little help with walking and/or transfers;A little help with bathing/dressing/bathroom   Can travel by private vehicle     Yes  Equipment Recommendations  Rolling walker (2 wheels);BSC/3in1    Recommendations for Other Services       Precautions / Restrictions Precautions Precautions: Fall;Knee Precaution Booklet Issued: Yes (comment) Recall of Precautions/Restrictions: Intact Restrictions Weight Bearing Restrictions Per Provider Order: Yes RLE Weight Bearing Per Provider Order: Weight bearing as tolerated     Mobility  Bed Mobility Overal bed mobility: Needs Assistance Bed Mobility: Sit to Supine     Supine to sit: HOB elevated, Used rails, Min assist     General bed mobility comments: assist needed for BLE into bed pt unable    Transfers Overall transfer level: Needs assistance Equipment used: Rolling walker (2  wheels) Transfers: Sit to/from Stand Sit to Stand: Min assist           General transfer comment: pt unable to stand with CGA, needed at least minA    Ambulation/Gait Ambulation/Gait assistance: Contact guard assist Gait Distance (Feet): 110 Feet Assistive device: Rolling walker (2 wheels) Gait Pattern/deviations: Step-to pattern, Step-through pattern, Ataxic, Trunk flexed       General Gait Details: antalgia still present 1-2 standing rest breaks due to leg heaviness and bilat arm fatigue per pt   Stairs             Wheelchair Mobility     Tilt Bed    Modified Rankin (Stroke Patients Only)       Balance Overall balance assessment: Needs assistance Sitting-balance support: Feet supported Sitting balance-Leahy Scale: Good     Standing balance support: Single extremity supported Standing balance-Leahy Scale: Fair Standing balance comment: able to stand at sink and brush teeth, CGA-minA for steadying with pericare in standing                            Communication    Cognition Arousal: Alert Behavior During Therapy: WFL for tasks assessed/performed   PT - Cognitive impairments: History of cognitive impairments, Orientation, Awareness, Problem solving, Safety/Judgement                         Following commands: Intact      Cueing Cueing Techniques: Verbal cues, Tactile cues  Exercises Total Joint Exercises Quad Sets: AROM, Strengthening, Right, Supine, 10 reps Heel Slides: AAROM, Strengthening, Right, 15 reps Straight Leg Raises: AAROM, Strengthening, Right, Supine, AROM, 10 reps Long Arc Quad: AROM, Right,  15 reps Goniometric ROM: decreasing AAROM for SLR, last two nearly AROM    General Comments        Pertinent Vitals/Pain Pain Assessment Pain Assessment: 0-10 Pain Score: 5  Pain Location: R knee Pain Descriptors / Indicators: Aching, Heaviness, Sharp, Grimacing Pain Intervention(s): Limited activity within  patient's tolerance, Monitored during session, Repositioned, Ice applied    Home Living                          Prior Function            PT Goals (current goals can now be found in the care plan section) Progress towards PT goals: Progressing toward goals    Frequency    BID      PT Plan      Co-evaluation              AM-PAC PT "6 Clicks" Mobility   Outcome Measure  Help needed turning from your back to your side while in a flat bed without using bedrails?: A Little Help needed moving from lying on your back to sitting on the side of a flat bed without using bedrails?: A Little Help needed moving to and from a bed to a chair (including a wheelchair)?: A Little Help needed standing up from a chair using your arms (e.g., wheelchair or bedside chair)?: A Little Help needed to walk in hospital room?: A Little Help needed climbing 3-5 steps with a railing? : A Little 6 Click Score: 18    End of Session Equipment Utilized During Treatment: Gait belt Activity Tolerance: Patient tolerated treatment well Patient left: in bed;with call bell/phone within reach;with bed alarm set Nurse Communication: Mobility status PT Visit Diagnosis: Difficulty in walking, not elsewhere classified (R26.2);Other abnormalities of gait and mobility (R26.89)     Time: 4010-2725 PT Time Calculation (min) (ACUTE ONLY): 17 min  Charges:    $Therapeutic Exercise: 8-22 mins PT General Charges $$ ACUTE PT VISIT: 1 Visit                     Olga Coaster PT, DPT 3:26 PM,06/01/23

## 2023-06-02 NOTE — Progress Notes (Signed)
   Subjective: 11 Days Post-Op Procedure(s) (LRB): ARTHROPLASTY, KNEE, TOTAL (Right) Patient reports pain as mild.   Patient is well, and has had no acute complaints or problems Denies any CP, SOB, ABD pain.  No nausea vomiting.  We will continue therapy today.  Plan is to go Skilled nursing facility after hospital stay.  Objective: Vital signs in last 24 hours: Temp:  [97.7 F (36.5 C)-98.4 F (36.9 C)] 97.7 F (36.5 C) (03/28 0730) Pulse Rate:  [74-88] 83 (03/28 0730) Resp:  [16-18] 16 (03/28 0730) BP: (124-152)/(43-70) 152/70 (03/28 0730) SpO2:  [97 %-99 %] 99 % (03/28 0730)  Intake/Output from previous day: 03/27 0701 - 03/28 0700 In: 290 [P.O.:290] Out: -  Intake/Output this shift: No intake/output data recorded.  No results for input(s): "HGB" in the last 72 hours.  No results for input(s): "WBC", "RBC", "HCT", "PLT" in the last 72 hours.  No results for input(s): "NA", "K", "CL", "CO2", "BUN", "CREATININE", "GLUCOSE", "CALCIUM" in the last 72 hours.  No results for input(s): "LABPT", "INR" in the last 72 hours.  EXAM General - Patient is Alert, Appropriate, and Oriented Extremity - Neurovascular intact Sensation intact distally Intact pulses distally Dorsiflexion/Plantar flexion intact -Homans' sign bilaterally Dressing - dressing C/D/I and no drainage Motor Function - intact, moving foot and toes well on exam.   Past Medical History:  Diagnosis Date   Adrenal adenoma, left    benign   Anemia    CKD (chronic kidney disease) stage 4, GFR 15-29 ml/min (HCC)    Colitis    Complication of anesthesia    bp dropped during EGD/during hand surgery bp became elevated   GERD (gastroesophageal reflux disease)    Helicobacter pylori gastritis    Hiatal hernia    History of chicken pox    History of Helicobacter pylori infection    History of rectal bleeding    Left renal mass    Low vitamin D level    Neuropathy    Osteoporosis    Presbyesophagus     Primary hypertension    Primary osteoarthritis involving multiple joints    Renal insufficiency    Seasonal allergies    Spinal stenosis of lumbar region    Tinnitus     Assessment/Plan:   11 Days Post-Op Procedure(s) (LRB): ARTHROPLASTY, KNEE, TOTAL (Right) Principal Problem:   S/P total knee arthroplasty, right Active Problems:   Status post total knee replacement using cement, right  Estimated body mass index is 28.87 kg/m as calculated from the following:   Height as of this encounter: 5\' 3"  (1.6 m).   Weight as of this encounter: 73.9 kg. Advance diet Up with therapy Pain well controlled Vital signs are stable CM to assist with discharge to SNF.  Patient is stable and ready for discharge to SNF today   DVT Prophylaxis - Lovenox, TED hose, and SCDs Weight-Bearing as tolerated to right leg   T. Cranston Neighbor, PA-C [facility] 06/02/2023, 8:34 AM

## 2023-06-02 NOTE — TOC Progression Note (Signed)
 Transition of Care [facility]) - Progression Note    Patient Details  Name: Teresa Johns MRN: [id number] Date of Birth: 12/09/1934  [facility]) CM/SW Contact  Marlowe Sax, RN Phone Number: 06/02/2023, 9:01 AM  Clinical Narrative:     Reached out to [facility] to inquire if the patient is able to admit there today, awaiting on a response        Expected Discharge Plan and Services         Expected Discharge Date: 06/02/23                                     Social Determinants of Health (SDOH) Interventions SDOH Screenings   Food Insecurity: No Food Insecurity (05/29/2023)  Housing: Low Risk  (05/29/2023)  Transportation Needs: No Transportation Needs (05/29/2023)  Utilities: Not At Risk (05/29/2023)  Depression (PHQ2-9): Low Risk  (01/17/2023)  Financial Resource Strain: Low Risk  (05/18/2023)   Received from [facility]  Social Connections: Socially Isolated (05/29/2023)  Tobacco Use: Low Risk  (05/22/2023)    Readmission Risk Interventions     No data to display

## 2023-06-02 NOTE — TOC Progression Note (Signed)
 Transition of Care [facility]) - Progression Note    Patient Details  Name: Teresa Johns MRN: [id number] Date of Birth: [birth year]  Transition of Care [facility]) CM/SW Contact  Marlowe Sax, RN Phone Number: 06/02/2023, 3:46 PM  Clinical Narrative:    [employer] ins pending to go to [employer] [facility]. Spoke with Florentina Addison at [employer] [employer] and she will continue to call for approval and hope to DC to [employer] [facility] on Monday        Expected Discharge Plan and Services         Expected Discharge Date: 06/02/23                                     Social Determinants of Health (SDOH) Interventions SDOH Screenings   Food Insecurity: No Food Insecurity (05/29/2023)  Housing: Low Risk  (05/29/2023)  Transportation Needs: No Transportation Needs (05/29/2023)  Utilities: Not At Risk (05/29/2023)  Depression (PHQ2-9): Low Risk  (01/17/2023)  Financial Resource Strain: Low Risk  (05/18/2023)   Received from [facility]  Social Connections: Socially Isolated (05/29/2023)  Tobacco Use: Low Risk  (05/22/2023)    Readmission Risk Interventions     No data to display

## 2023-06-02 NOTE — Plan of Care (Signed)

## 2023-06-02 NOTE — Evaluation (Signed)
 Occupational Therapy Evaluation Patient Details Name: Teresa Johns MRN: [id number] DOB: 1934/07/17 Today's Date: 06/02/2023   History of Present Illness   Patient is a 89 year old with right knee osteoarthritis. S/p right total knee arthroplasty. History of back surgery     Clinical Impressions Pt was seen for OT evaluation this date. Prior to hospital admission, pt was living at home alone with intermittent check ins from family. Pt was IND/MOD I with ADLs/IADLs and mobility using SPC or furniture surfing at baseline.  Pt presents to acute OT demonstrating impaired ADL performance and functional mobility 2/2 weakness, pain, low activity tolerance and balance deficits. Pt currently requires MIN/CGA for STS from recliner to RW with increased time and cueing for positioning. Ambulated to the bathroom and back using RW with CGA. Toilet transfer to [facility] over top of toilet and peri-care in standing with CGA. Pt stood at sink to perform hand hygiene with CGA. Pt demo ability to reach her feet, however would likely need Min/Mod A for LB ADL management.  Pt would benefit from skilled OT services to address noted impairments and functional limitations (see below for any additional details) in order to maximize safety and independence while minimizing falls risk and caregiver burden. Do anticipate the need for follow up OT services upon acute hospital DC.      If plan is discharge home, recommend the following:   A little help with walking and/or transfers;A little help with bathing/dressing/bathroom;Assistance with cooking/housework;Assist for transportation;Help with stairs or ramp for entrance     Functional Status Assessment   Patient has had a recent decline in their functional status and demonstrates the ability to make significant improvements in function in a reasonable and predictable amount of time.     Equipment Recommendations   BSC/3in1     Recommendations for Other  Services         Precautions/Restrictions   Precautions Precautions: Fall;Knee Precaution Booklet Issued: Yes (comment) Recall of Precautions/Restrictions: Intact Restrictions Weight Bearing Restrictions Per Provider Order: Yes RLE Weight Bearing Per Provider Order: Weight bearing as tolerated     Mobility Bed Mobility               General bed mobility comments: in recliner pre/post session    Transfers Overall transfer level: Needs assistance Equipment used: Rolling walker (2 wheels) Transfers: Sit to/from Stand Sit to Stand: Contact guard assist, Min assist           General transfer comment: Min to CGA for STS from recliner and BSC over top of toilet      Balance Overall balance assessment: Needs assistance Sitting-balance support: Feet supported Sitting balance-Leahy Scale: Good Sitting balance - Comments: able to touch bil feet while seated in recliner   Standing balance support: Bilateral upper extremity supported Standing balance-Leahy Scale: Fair Standing balance comment: able to stand at sink to perform hand hygiene                           ADL either performed or assessed with clinical judgement   ADL Overall ADL's : Needs assistance/impaired     Grooming: Wash/dry hands;Contact guard assist;Standing                   Toilet Transfer: Contact guard assist;Rolling walker (2 wheels);Grab bars   Toileting- Clothing Manipulation and Hygiene: Contact guard assist;Sit to/from stand Toileting - Clothing Manipulation Details (indicate cue type and reason): anterior hygiene  Functional mobility during ADLs: Contact guard assist;Rolling walker (2 wheels)       Vision         Perception         Praxis         Pertinent Vitals/Pain Pain Assessment Pain Assessment: Faces Faces Pain Scale: Hurts a little bit Pain Location: R knee Pain Descriptors / Indicators: Aching, Heaviness, Sharp, Grimacing Pain  Intervention(s): Monitored during session, Repositioned     Extremity/Trunk Assessment Upper Extremity Assessment Upper Extremity Assessment: Generalized weakness (limited R shoulder ROM at baseline)   Lower Extremity Assessment Lower Extremity Assessment: Generalized weakness RLE Deficits / Details: s/p r TKA       Communication Communication Communication: No apparent difficulties   Cognition Arousal: Alert Behavior During Therapy: WFL for tasks assessed/performed                                 Following commands: Intact       Cueing  General Comments   Cueing Techniques: Verbal cues;Tactile cues      Exercises Other Exercises Other Exercises: Edu on role of OT in acute setting.   Shoulder Instructions      Home Living Family/patient expects to be discharged to:: Private residence Living Arrangements: Alone Available Help at Discharge: Family;Available PRN/intermittently Type of Home: House Home Access: Stairs to enter [employer] of Steps: 2 Entrance Stairs-Rails: Right Home Layout: One level     Bathroom Shower/Tub: Tub/shower unit         Home Equipment: Agricultural consultant (2 wheels);Rollator (4 wheels);Cane - single point;Shower seat;Grab bars - tub/shower          Prior Functioning/Environment Prior Level of Function : Independent/Modified Independent             Mobility Comments: Mod I with single point cane and often uses furniture for support ADLs Comments: Mod I with shower seat for showering    OT Problem List: Decreased strength;Decreased activity tolerance;Impaired balance (sitting and/or standing)   OT Treatment/Interventions: Self-care/ADL training;Balance training;Therapeutic exercise;Therapeutic activities;DME and/or AE instruction;Patient/family education      OT Goals(Current goals can be found in the care plan section)   Acute Rehab OT Goals Patient Stated Goal: return home OT Goal Formulation:  With patient Time For Goal Achievement: 06/16/23 Potential to Achieve Goals: Good ADL Goals Pt Will Perform Lower Body Bathing: with supervision;with contact guard assist;sit to/from stand;sitting/lateral leans Pt Will Perform Lower Body Dressing: with supervision;with contact guard assist;sitting/lateral leans;sit to/from stand Pt Will Transfer to Toilet: with supervision;with contact guard assist;bedside commode;ambulating Pt Will Perform Toileting - Clothing Manipulation and hygiene: with supervision;with modified independence;sitting/lateral leans;sit to/from stand   OT Frequency:  Min 2X/week    Co-evaluation              AM-PAC OT "6 Clicks" Daily Activity     Outcome Measure Help from another person eating meals?: None Help from another person taking care of personal grooming?: A Little Help from another person toileting, which includes using toliet, bedpan, or urinal?: A Little Help from another person bathing (including washing, rinsing, drying)?: A Little Help from another person to put on and taking off regular upper body clothing?: A Little Help from another person to put on and taking off regular lower body clothing?: A Little 6 Click Score: 19   End of Session Equipment Utilized During Treatment: Rolling walker (2 wheels) Nurse Communication: Mobility status  Activity Tolerance: Patient tolerated treatment well Patient left: in chair;with call bell/phone within reach;with chair alarm set  OT Visit Diagnosis: Other abnormalities of gait and mobility (R26.89);Unsteadiness on feet (R26.81);Muscle weakness (generalized) (M62.81)                Time: 1610-9604 OT Time Calculation (min): 22 min Charges:  OT General Charges $OT Visit: 1 Visit OT Evaluation $OT Eval Moderate Complexity: 1 Mod OT Treatments $Self Care/Home Management : 8-22 mins Aura Bibby, OTR/L 06/02/23, 11:04 AM  Ramel Tobon E Tiyana Galla 06/02/2023, 11:01 AM

## 2023-06-02 NOTE — Progress Notes (Signed)
 Physical Therapy Treatment Patient Details Name: Teresa Johns MRN: [id number] DOB: 29-Sep-1934 Today's Date: 06/02/2023   History of Present Illness Patient is a 89 year old with right knee osteoarthritis. S/p right total knee arthroplasty. History of back surgery    PT Comments  Pt in chair,  stands and completes x 1 lap on unit today with slow but steady gait.  She continues to need +1 for transitions with cues for hand placements and light support.  Assist provided for stretching and supine AAROM.  Continues to struggle with independent SLR in long sitting but no buckling noted with gait.  Pt does do well in AM but is inconsistent in PM often needing more assist for transitions and decreased gait quality.    Plan remains for SNF.     If plan is discharge home, recommend the following: Assistance with cooking/housework;Direct supervision/assist for medications management;Direct supervision/assist for financial management;Assist for transportation;Help with stairs or ramp for entrance;Supervision due to cognitive status;A little help with walking and/or transfers;A little help with bathing/dressing/bathroom   Can travel by private vehicle        Equipment Recommendations  Rolling walker (2 wheels);BSC/3in1    Recommendations for Other Services       Precautions / Restrictions Precautions Precautions: Fall;Knee Precaution Booklet Issued: Yes (comment) Recall of Precautions/Restrictions: Intact Restrictions Weight Bearing Restrictions Per Provider Order: Yes RLE Weight Bearing Per Provider Order: Weight bearing as tolerated     Mobility  Bed Mobility               General bed mobility comments: in recliner before and after Patient Response: Cooperative  Transfers Overall transfer level: Needs assistance Equipment used: Rolling walker (2 wheels) Transfers: Sit to/from Stand Sit to Stand: Contact guard assist, Min assist                 Ambulation/Gait Ambulation/Gait assistance: Contact guard assist Gait Distance (Feet): 200 Feet Assistive device: Rolling walker (2 wheels) Gait Pattern/deviations: Step-to pattern, Step-through pattern, Ataxic, Trunk flexed Gait velocity: decreased     General Gait Details: slow but able to increase distance today   Stairs             Wheelchair Mobility     Tilt Bed Tilt Bed Patient Response: Cooperative  Modified Rankin (Stroke Patients Only)       Balance Overall balance assessment: Needs assistance Sitting-balance support: Feet supported Sitting balance-Leahy Scale: Good     Standing balance support: Bilateral upper extremity supported Standing balance-Leahy Scale: Fair                              Hotel manager: No apparent difficulties  Cognition Arousal: Alert Behavior During Therapy: WFL for tasks assessed/performed   PT - Cognitive impairments: History of cognitive impairments, Orientation, Awareness, Problem solving, Safety/Judgement                         Following commands: Intact      Cueing Cueing Techniques: Verbal cues, Tactile cues  Exercises Total Joint Exercises Goniometric ROM: 2-95 Other Exercises Other Exercises: seated AROM and stretching and supine AAROM.    General Comments        Pertinent Vitals/Pain Pain Assessment Pain Assessment: Faces Faces Pain Scale: Hurts a little bit Pain Location: R knee Pain Descriptors / Indicators: Aching, Heaviness, Sharp, Grimacing Pain Intervention(s): Limited activity within patient's tolerance, Monitored during session,  Repositioned    Home Living                          Prior Function            PT Goals (current goals can now be found in the care plan section) Progress towards PT goals: Progressing toward goals    Frequency    7x      PT Plan      Co-evaluation              AM-PAC PT "6  Clicks" Mobility   Outcome Measure  Help needed turning from your back to your side while in a flat bed without using bedrails?: A Little Help needed moving from lying on your back to sitting on the side of a flat bed without using bedrails?: A Little Help needed moving to and from a bed to a chair (including a wheelchair)?: A Little Help needed standing up from a chair using your arms (e.g., wheelchair or bedside chair)?: A Little Help needed to walk in hospital room?: A Little Help needed climbing 3-5 steps with a railing? : A Little 6 Click Score: 18    End of Session Equipment Utilized During Treatment: Gait belt Activity Tolerance: Patient tolerated treatment well Patient left: in chair;with chair alarm set;with call bell/phone within reach Nurse Communication: Mobility status PT Visit Diagnosis: Difficulty in walking, not elsewhere classified (R26.2);Other abnormalities of gait and mobility (R26.89)     Time: 0945-1000 PT Time Calculation (min) (ACUTE ONLY): 15 min  Charges:    $Gait Training: 8-22 mins PT General Charges $$ ACUTE PT VISIT: 1 Visit                   Danielle Dess, PTA 06/02/23, 10:32 AM

## 2023-06-02 NOTE — TOC Progression Note (Signed)
 Transition of Care [facility]) - Progression Note    Patient Details  Name: Teresa Johns MRN: [id number] Date of Birth: 07/29/1934  Transition of Care [facility]) CM/SW Contact  Marlowe Sax, RN Phone Number: 06/02/2023, 11:58 AM  Clinical Narrative:     Called [facility] and requested to change Ins auth to [employer], out of network facility due to [facility] not being able to accept admissions, awaiting a response        Expected Discharge Plan and Services         Expected Discharge Date: 06/02/23                                     Social Determinants of Health (SDOH) Interventions SDOH Screenings   Food Insecurity: No Food Insecurity (05/29/2023)  Housing: Low Risk  (05/29/2023)  Transportation Needs: No Transportation Needs (05/29/2023)  Utilities: Not At Risk (05/29/2023)  Depression (PHQ2-9): Low Risk  (01/17/2023)  Financial Resource Strain: Low Risk  (05/18/2023)   Received from [facility]  Social Connections: Socially Isolated (05/29/2023)  Tobacco Use: Low Risk  (05/22/2023)    Readmission Risk Interventions     No data to display

## 2023-06-03 MED ORDER — LORATADINE 10 MG PO TABS
10.0000 mg | ORAL_TABLET | Freq: Every day | ORAL | Status: DC
  Administered 2023-06-03 – 2023-06-05 (×3): 10 mg via ORAL
  Filled 2023-06-03 (×3): qty 1

## 2023-06-03 NOTE — Progress Notes (Signed)
 Physical Therapy Treatment Patient Details Name: Teresa Johns MRN: [id number] DOB: 02/24/1935 Today's Date: 06/03/2023   History of Present Illness Patient is a 89 year old with right knee osteoarthritis. S/p right total knee arthroplasty. History of back surgery    PT Comments  Pt was I'ly getting OOB to EOB. She endorses frustration on lack of assistance when hitting the call bell." Ive been waiting for ~ 45 minutes and I need to go to the BR." Author assisted pt to [facility] for successful, large, formed BM prior to pt standing and ambulating into hallway ~ 200 ft total with use of RW. No LOB. Pt remains verbally upset throughout session that limited overall session progression.  Antalgic step to pattern during ambulation at first that quickly improved to step through pattern. Pt is doing well. Dc recs updated to reflect pt's progression. She will continue to benefit from skilled PT to maximize her independence and safety with all ADLs.      If plan is discharge home, recommend the following: Assistance with cooking/housework;Direct supervision/assist for medications management;Direct supervision/assist for financial management;Assist for transportation;Help with stairs or ramp for entrance;Supervision due to cognitive status;A little help with walking and/or transfers;A little help with bathing/dressing/bathroom     Equipment Recommendations  Rolling walker (2 wheels);BSC/3in1       Precautions / Restrictions Precautions Precautions: Fall;Knee Precaution Booklet Issued: Yes (comment) Recall of Precautions/Restrictions: Intact Restrictions Weight Bearing Restrictions Per Provider Order: Yes RLE Weight Bearing Per Provider Order: Weight bearing as tolerated     Mobility  Bed Mobility  General bed mobility comments: pt was independently getting to EOB upon arrival due to inability to wait longer on RN staff to assist her with getting to BR    Transfers Overall transfer level: Needs  assistance Equipment used: Rolling walker (2 wheels) Transfers: Sit to/from Stand Sit to Stand: Supervision  General transfer comment: pt stood from EOB to RW with close SBA only    Ambulation/Gait Ambulation/Gait assistance: Contact guard assist, Supervision Gait Distance (Feet): 200 Feet Assistive device: Rolling walker (2 wheels) Gait Pattern/deviations: Step-to pattern, Step-through pattern, Antalgic Gait velocity: decreased  General Gait Details: step to gait that porgressed quickly to step through pattern after ~ 5-10 steps with BUE RW. pt refused to do stairs even when encouraged to do so. " I'm just too out of sorts today to do stairs."   Stairs Stairs:  (unwilling)     Balance Overall balance assessment: Needs assistance Sitting-balance support: Feet supported Sitting balance-Leahy Scale: Good     Standing balance support: Bilateral upper extremity supported, During functional activity, Reliant on assistive device for balance Standing balance-Leahy Scale: Fair      Hotel manager: No apparent difficulties  Cognition Arousal: Alert Behavior During Therapy: WFL for tasks assessed/performed   PT - Cognitive impairments: History of cognitive impairments, Orientation, Awareness, Problem solving, Safety/Judgement   Orientation impairments: Situation, Time    PT - Cognition Comments: Does consistently follow commands and remains pleasant throughout. Slower processing. very upset/anxious/ and frustrated upon arrival Following commands: Intact      Cueing Cueing Techniques: Verbal cues, Tactile cues     General Comments General comments (skin integrity, edema, etc.): Author assisted pt on/off BSC after successful large formed BM      Pertinent Vitals/Pain Pain Assessment Pain Assessment: 0-10 Pain Score: 4  Pain Location: face pain on L sid eof face/headache (this is chronic per pt) Pain Descriptors / Indicators: Aching, Heaviness,  Sharp, Grimacing Pain  Intervention(s): Limited activity within patient's tolerance, Monitored during session, Patient requesting pain meds-RN notified, Repositioned, Ice applied     PT Goals (current goals can now be found in the care plan section) Acute Rehab PT Goals Patient Stated Goal: "Get well so I can leave this place." Progress towards PT goals: Progressing toward goals    Frequency    7X/week       AM-PAC PT "6 Clicks" Mobility   Outcome Measure  Help needed turning from your back to your side while in a flat bed without using bedrails?: A Little Help needed moving from lying on your back to sitting on the side of a flat bed without using bedrails?: A Little Help needed moving to and from a bed to a chair (including a wheelchair)?: A Little Help needed standing up from a chair using your arms (e.g., wheelchair or bedside chair)?: A Little Help needed to walk in hospital room?: A Little Help needed climbing 3-5 steps with a railing? : A Little 6 Click Score: 18    End of Session   Activity Tolerance: Patient tolerated treatment well Patient left: in chair;with chair alarm set;with call bell/phone within reach;with family/visitor present (sister arrived at conclusion of PT session) Nurse Communication: Mobility status PT Visit Diagnosis: Difficulty in walking, not elsewhere classified (R26.2);Other abnormalities of gait and mobility (R26.89)     Time: 1610-9604 PT Time Calculation (min) (ACUTE ONLY): 14 min  Charges:    $Gait Training: 8-22 mins PT General Charges $$ ACUTE PT VISIT: 1 Visit                     Jetta Lout PTA 06/03/23, 2:39 PM

## 2023-06-03 NOTE — Progress Notes (Signed)
   Subjective: 12 Days Post-Op Procedure(s) (LRB): ARTHROPLASTY, KNEE, TOTAL (Right) Patient reports pain as mild in the right knee. Reporting some dry mouth, esp at night. Patient is well, and has had no acute complaints or problems Denies any CP, SOB, ABD pain.  No nausea vomiting.  We will continue therapy today.  Plan is to go Skilled nursing facility after hospital stay.  Objective: Vital signs in last 24 hours: Temp:  [97.7 F (36.5 C)-98.3 F (36.8 C)] 97.7 F (36.5 C) (03/29 0525) Pulse Rate:  [74-85] 78 (03/29 0525) Resp:  [16-18] 16 (03/29 0525) BP: (134-150)/(55-66) 134/58 (03/29 0525) SpO2:  [96 %-97 %] 97 % (03/29 0525)  Intake/Output from previous day: 03/28 0701 - 03/29 0700 In: 480 [P.O.:480] Out: -  Intake/Output this shift: No intake/output data recorded.  No results for input(s): "HGB" in the last 72 hours.  No results for input(s): "WBC", "RBC", "HCT", "PLT" in the last 72 hours.  No results for input(s): "NA", "K", "CL", "CO2", "BUN", "CREATININE", "GLUCOSE", "CALCIUM" in the last 72 hours.  No results for input(s): "LABPT", "INR" in the last 72 hours.  EXAM General - Patient is Alert, Appropriate, and Oriented Extremity - Neurovascular intact Sensation intact distally Intact pulses distally Dorsiflexion/Plantar flexion intact -Homans' sign bilaterally Dressing - dressing C/D/I and no drainage Motor Function - intact, moving foot and toes well on exam.   Past Medical History:  Diagnosis Date   Adrenal adenoma, left    benign   Anemia    CKD (chronic kidney disease) stage 4, GFR 15-29 ml/min (HCC)    Colitis    Complication of anesthesia    bp dropped during EGD/during hand surgery bp became elevated   GERD (gastroesophageal reflux disease)    Helicobacter pylori gastritis    Hiatal hernia    History of chicken pox    History of Helicobacter pylori infection    History of rectal bleeding    Left renal mass    Low vitamin D level     Neuropathy    Osteoporosis    Presbyesophagus    Primary hypertension    Primary osteoarthritis involving multiple joints    Renal insufficiency    Seasonal allergies    Spinal stenosis of lumbar region    Tinnitus     Assessment/Plan:   12 Days Post-Op Procedure(s) (LRB): ARTHROPLASTY, KNEE, TOTAL (Right) Principal Problem:   S/P total knee arthroplasty, right Active Problems:   Status post total knee replacement using cement, right  Estimated body mass index is 28.87 kg/m as calculated from the following:   Height as of this encounter: 5\' 3"  (1.6 m).   Weight as of this encounter: 73.9 kg. Advance diet Up with therapy Pain well controlled Vital signs are stable CM to assist with discharge to SNF.  Patient is stable and ready for discharge to SNF today Added claritin for possible allergy related dry mouth at night.  Encouraged continued oral liquid intake.  DVT Prophylaxis - Lovenox, TED hose, and SCDs Weight-Bearing as tolerated to right leg   J. Horris Latino, PA-C [facility] 06/03/2023, 7:38 AM

## 2023-06-04 NOTE — Plan of Care (Signed)
  Problem: Education: Goal: Knowledge of the prescribed therapeutic regimen will improve Outcome: Progressing   Problem: Activity: Goal: Ability to avoid complications of mobility impairment will improve Outcome: Progressing   Problem: Clinical Measurements: Goal: Postoperative complications will be avoided or minimized Outcome: Progressing   Problem: Pain Management: Goal: Pain level will decrease with appropriate interventions Outcome: Progressing   Problem: Nutrition: Goal: Adequate nutrition will be maintained Outcome: Progressing   Problem: Safety: Goal: Ability to remain free from injury will improve Outcome: Progressing   Problem: Skin Integrity: Goal: Risk for impaired skin integrity will decrease Outcome: Progressing

## 2023-06-04 NOTE — Progress Notes (Signed)
 Subjective: 13 Days Post-Op Procedure(s) (LRB): ARTHROPLASTY, KNEE, TOTAL (Right) Patient reports pain as mild in the right knee. Patient is well, and has had no acute complaints or problems Denies any CP, SOB, ABD pain.  No nausea vomiting.  We will continue therapy today.  Plan is to go Skilled nursing facility after hospital stay. Still waiting on insurance approval for discharge to SNF.  Objective: Vital signs in last 24 hours: Temp:  [98.1 F (36.7 C)-99 F (37.2 C)] 98.2 F (36.8 C) (03/30 0957) Pulse Rate:  [81-95] 83 (03/30 0957) Resp:  [15-17] 17 (03/30 0957) BP: (134-149)/(54-69) 135/60 (03/30 0957) SpO2:  [96 %-98 %] 98 % (03/30 0957)  Intake/Output from previous day: 03/29 0701 - 03/30 0700 In: 240 [P.O.:240] Out: -  Intake/Output this shift: Total I/O In: 120 [P.O.:120] Out: -   No results for input(s): "HGB" in the last 72 hours.  No results for input(s): "WBC", "RBC", "HCT", "PLT" in the last 72 hours.  No results for input(s): "NA", "K", "CL", "CO2", "BUN", "CREATININE", "GLUCOSE", "CALCIUM" in the last 72 hours.  No results for input(s): "LABPT", "INR" in the last 72 hours.  EXAM General - Patient is Alert, Appropriate, and Oriented Extremity - Neurovascular intact Sensation intact distally Intact pulses distally Dorsiflexion/Plantar flexion intact -Homans' sign bilaterally Dressing - dressing C/D/I and no drainage Motor Function - intact, moving foot and toes well on exam.   Past Medical History:  Diagnosis Date   Adrenal adenoma, left    benign   Anemia    CKD (chronic kidney disease) stage 4, GFR 15-29 ml/min (HCC)    Colitis    Complication of anesthesia    bp dropped during EGD/during hand surgery bp became elevated   GERD (gastroesophageal reflux disease)    Helicobacter pylori gastritis    Hiatal hernia    History of chicken pox    History of Helicobacter pylori infection    History of rectal bleeding    Left renal mass    Low  vitamin D level    Neuropathy    Osteoporosis    Presbyesophagus    Primary hypertension    Primary osteoarthritis involving multiple joints    Renal insufficiency    Seasonal allergies    Spinal stenosis of lumbar region    Tinnitus    Assessment/Plan:   13 Days Post-Op Procedure(s) (LRB): ARTHROPLASTY, KNEE, TOTAL (Right) Principal Problem:   S/P total knee arthroplasty, right Active Problems:   Status post total knee replacement using cement, right  Estimated body mass index is 28.87 kg/m as calculated from the following:   Height as of this encounter: 5\' 3"  (1.6 m).   Weight as of this encounter: 73.9 kg. Advance diet Up with therapy Pain well controlled Vital signs are stable CM to assist with discharge to SNF.  Patient is stable and ready for discharge to SNF today  DVT Prophylaxis - Lovenox, TED hose, and SCDs Weight-Bearing as tolerated to right leg  J. Horris Latino, PA-C [facility] 06/04/2023, 12:08 PM

## 2023-06-04 NOTE — Progress Notes (Signed)
 Physical Therapy Treatment Patient Details Name: Teresa Johns MRN: [id number] DOB: 04/06/1934 Today's Date: 06/04/2023   History of Present Illness Patient is a 89 year old with right knee osteoarthritis. S/p right total knee arthroplasty. History of back surgery    PT Comments  To BSC to void, completes lap and participated in exercises as described below. Pt continues to progress well with therapies.  She remains firm that she needs rehab and that her family cannot provide assist as needed.  She still requires +1 assist for general mobility for safety, ADL task, meals and transportation.  She does seem to have increased difficulty in PM vs AM sessions requiring some increased assist for mobility due to decreased gait quality and balance.     If plan is discharge home, recommend the following: Assistance with cooking/housework;Direct supervision/assist for medications management;Direct supervision/assist for financial management;Assist for transportation;Help with stairs or ramp for entrance;Supervision due to cognitive status;A little help with walking and/or transfers;A little help with bathing/dressing/bathroom   Can travel by private vehicle        Equipment Recommendations  Rolling walker (2 wheels);BSC/3in1    Recommendations for Other Services       Precautions / Restrictions Precautions Precautions: Fall;Knee Precaution Booklet Issued: Yes (comment) Recall of Precautions/Restrictions: Intact Restrictions Weight Bearing Restrictions Per Provider Order: Yes RLE Weight Bearing Per Provider Order: Weight bearing as tolerated     Mobility  Bed Mobility               General bed mobility comments: sitting EOB upon arrival Patient Response: Cooperative  Transfers Overall transfer level: Needs assistance Equipment used: Rolling walker (2 wheels) Transfers: Sit to/from Stand Sit to Stand: Supervision                Ambulation/Gait Ambulation/Gait  assistance: Contact guard assist, Supervision Gait Distance (Feet): 200 Feet Assistive device: Rolling walker (2 wheels) Gait Pattern/deviations: Step-to pattern, Step-through pattern, Antalgic Gait velocity: decreased     General Gait Details: completes x 1 lap   Stairs             Wheelchair Mobility     Tilt Bed Tilt Bed Patient Response: Cooperative  Modified Rankin (Stroke Patients Only)       Balance Overall balance assessment: Needs assistance Sitting-balance support: Feet supported Sitting balance-Leahy Scale: Good     Standing balance support: Bilateral upper extremity supported, During functional activity, Reliant on assistive device for balance Standing balance-Leahy Scale: Fair                              Hotel manager: No apparent difficulties  Cognition Arousal: Alert Behavior During Therapy: WFL for tasks assessed/performed   PT - Cognitive impairments: History of cognitive impairments, Awareness, Problem solving                       PT - Cognition Comments: Does consistently follow commands and remains pleasant throughout. Slower processing. Following commands: Intact      Cueing Cueing Techniques: Verbal cues, Tactile cues  Exercises Total Joint Exercises Goniometric ROM: 2-95 limited by chair Other Exercises Other Exercises: seated AROM and supine AAROM in long sitting in recliner    General Comments        Pertinent Vitals/Pain Pain Assessment Pain Assessment: Faces Faces Pain Scale: Hurts a little bit Pain Location: knee Pain Intervention(s): Monitored during session, Repositioned    Home Living  Prior Function            PT Goals (current goals can now be found in the care plan section) Progress towards PT goals: Progressing toward goals    Frequency    7X/week      PT Plan      Co-evaluation              AM-PAC PT  "6 Clicks" Mobility   Outcome Measure  Help needed turning from your back to your side while in a flat bed without using bedrails?: A Little Help needed moving from lying on your back to sitting on the side of a flat bed without using bedrails?: A Little Help needed moving to and from a bed to a chair (including a wheelchair)?: A Little Help needed standing up from a chair using your arms (e.g., wheelchair or bedside chair)?: A Little Help needed to walk in hospital room?: A Little Help needed climbing 3-5 steps with a railing? : A Little 6 Click Score: 18    End of Session Equipment Utilized During Treatment: Gait belt Activity Tolerance: Patient tolerated treatment well Patient left: in chair;with chair alarm set;with call bell/phone within reach Nurse Communication: Mobility status PT Visit Diagnosis: Difficulty in walking, not elsewhere classified (R26.2);Other abnormalities of gait and mobility (R26.89)     Time: 1610-9604 PT Time Calculation (min) (ACUTE ONLY): 18 min  Charges:    $Gait Training: 8-22 mins PT General Charges $$ ACUTE PT VISIT: 1 Visit                   Danielle Dess, PTA 06/04/23, 10:56 AM

## 2023-06-05 MED ORDER — ENOXAPARIN SODIUM 30 MG/0.3ML IJ SOSY: 30.0000 mg | PREFILLED_SYRINGE | INTRAMUSCULAR | Status: AC

## 2023-06-05 MED ORDER — ASPIRIN 81 MG PO TBEC: 81.0000 mg | DELAYED_RELEASE_TABLET | Freq: Two times a day (BID) | ORAL | Status: AC

## 2023-06-05 NOTE — Progress Notes (Signed)
 Occupational Therapy Treatment Patient Details Name: Teresa Johns MRN: [id number] DOB: 05-27-1934 Today's Date: 06/05/2023   History of present illness Patient is a 89 year old with right knee osteoarthritis. S/p right total knee arthroplasty. History of back surgery   OT comments  Pt received upright in recliner, agreeable to OT tx session. Able to direct OT to request BSC over reg commode for ease of toilet transfer. Completes STS with CGA progressing to supervision from various surface levels, supervision for pericare after BM, and functional mobility 1 lap on unit with RW + CGA for safety. Discussed voiding schedule with pt as she voices concerns of urinary incontinence. Handoff to PT end of session. Recommending +1 assist at home for ADLs/mobility. Pt making progress towards goals, OT will continue to follow.       If plan is discharge home, recommend the following:  A little help with walking and/or transfers;A little help with bathing/dressing/bathroom;Assistance with cooking/housework;Assist for transportation;Help with stairs or ramp for entrance   Equipment Recommendations  BSC/3in1       Precautions / Restrictions Precautions Precautions: Fall;Knee Precaution Booklet Issued: Yes (comment) Recall of Precautions/Restrictions: Intact Restrictions Weight Bearing Restrictions Per Provider Order: Yes RLE Weight Bearing Per Provider Order: Weight bearing as tolerated       Mobility Bed Mobility               General bed mobility comments: NT, in recliner pre- -post session    Transfers Overall transfer level: Needs assistance Equipment used: Rolling walker (2 wheels) Transfers: Sit to/from Stand Sit to Stand: Supervision                 Balance Overall balance assessment: Needs assistance Sitting-balance support: Feet supported Sitting balance-Leahy Scale: Good     Standing balance support: Bilateral upper extremity supported, During functional  activity, Reliant on assistive device for balance Standing balance-Leahy Scale: Fair Standing balance comment: able to stand at sink to perform hand hygiene                           ADL either performed or assessed with clinical judgement   ADL Overall ADL's : Needs assistance/impaired     Grooming: Wash/dry hands;Contact guard assist;Standing Grooming Details (indicate cue type and reason): no LOB while reaching down to open trash can and throw away paper towel                 Toilet Transfer: Supervision/safety;BSC/3in1;Rolling walker (2 wheels) Toilet Transfer Details (indicate cue type and reason): BSC over commode Toileting- Clothing Manipulation and Hygiene: Supervision/safety;Sit to/from stand       Functional mobility during ADLs: Contact guard assist;Rolling walker (2 wheels)       Communication Communication Communication: No apparent difficulties   Cognition Arousal: Alert Behavior During Therapy: WFL for tasks assessed/performed                                 Following commands: Intact        Cueing   Cueing Techniques: Verbal cues, Tactile cues             Pertinent Vitals/ Pain       Pain Assessment Pain Assessment: Faces Faces Pain Scale: Hurts a little bit Pain Location: R knee during STS Pain Descriptors / Indicators: Sore Pain Intervention(s): Limited activity within patient's tolerance   Frequency  Min 2X/week  Progress Toward Goals  OT Goals(current goals can now be found in the care plan section)  Progress towards OT goals: Progressing toward goals  Acute Rehab OT Goals OT Goal Formulation: With patient Time For Goal Achievement: 06/16/23 Potential to Achieve Goals: Good ADL Goals Pt Will Perform Lower Body Bathing: with supervision;with contact guard assist;sit to/from stand;sitting/lateral leans Pt Will Perform Lower Body Dressing: with supervision;with contact guard  assist;sitting/lateral leans;sit to/from stand Pt Will Transfer to Toilet: with supervision;with contact guard assist;bedside commode;ambulating Pt Will Perform Toileting - Clothing Manipulation and hygiene: with supervision;with modified independence;sitting/lateral leans;sit to/from stand  Plan         AM-PAC OT "6 Clicks" Daily Activity     Outcome Measure   Help from another person eating meals?: None Help from another person taking care of personal grooming?: A Little Help from another person toileting, which includes using toliet, bedpan, or urinal?: A Little Help from another person bathing (including washing, rinsing, drying)?: A Little Help from another person to put on and taking off regular upper body clothing?: A Little Help from another person to put on and taking off regular lower body clothing?: A Little 6 Click Score: 19    End of Session Equipment Utilized During Treatment: Rolling walker (2 wheels);Gait belt  OT Visit Diagnosis: Other abnormalities of gait and mobility (R26.89);Unsteadiness on feet (R26.81);Muscle weakness (generalized) (M62.81)   Activity Tolerance Patient tolerated treatment well   Patient Left in chair;with call bell/phone within reach;with chair alarm set   Nurse Communication Mobility status        Time: 4098-1191 OT Time Calculation (min): 23 min  Charges: OT General Charges $OT Visit: 1 Visit OT Treatments $Self Care/Home Management : 23-37 mins  Marquesha Robideau L. Geovanny Sartin, OTR/L  06/05/23, 12:46 PM

## 2023-06-05 NOTE — Progress Notes (Signed)
   Subjective: 14 Days Post-Op Procedure(s) (LRB): ARTHROPLASTY, KNEE, TOTAL (Right) Patient reports pain as mild.   Patient is well, and has had no acute complaints or problems Denies any CP, SOB, ABD pain.  No nausea vomiting.  We will continue therapy today.  Plan is to go Skilled nursing facility after hospital stay.  Objective: Vital signs in last 24 hours: Temp:  [98 F (36.7 C)-99.1 F (37.3 C)] 98 F (36.7 C) (03/31 0713) Pulse Rate:  [74-96] 74 (03/31 0713) Resp:  [17-18] 17 (03/31 0713) BP: (127-137)/(54-72) 128/72 (03/31 0713) SpO2:  [94 %-98 %] 95 % (03/31 0713)  Intake/Output from previous day: 03/30 0701 - 03/31 0700 In: 120 [P.O.:120] Out: -  Intake/Output this shift: No intake/output data recorded.  No results for input(s): "HGB" in the last 72 hours.  No results for input(s): "WBC", "RBC", "HCT", "PLT" in the last 72 hours.  No results for input(s): "NA", "K", "CL", "CO2", "BUN", "CREATININE", "GLUCOSE", "CALCIUM" in the last 72 hours.  No results for input(s): "LABPT", "INR" in the last 72 hours.  EXAM General - Patient is Alert, Appropriate, and Oriented Extremity - Neurovascular intact Sensation intact distally Intact pulses distally Dorsiflexion/Plantar flexion intact -Homans' sign bilaterally Dressing - dressing C/D/I and no drainage Motor Function - intact, moving foot and toes well on exam.   Past Medical History:  Diagnosis Date   Adrenal adenoma, left    benign   Anemia    CKD (chronic kidney disease) stage 4, GFR 15-29 ml/min (HCC)    Colitis    Complication of anesthesia    bp dropped during EGD/during hand surgery bp became elevated   GERD (gastroesophageal reflux disease)    Helicobacter pylori gastritis    Hiatal hernia    History of chicken pox    History of Helicobacter pylori infection    History of rectal bleeding    Left renal mass    Low vitamin D level    Neuropathy    Osteoporosis    Presbyesophagus    Primary  hypertension    Primary osteoarthritis involving multiple joints    Renal insufficiency    Seasonal allergies    Spinal stenosis of lumbar region    Tinnitus     Assessment/Plan:   14 Days Post-Op Procedure(s) (LRB): ARTHROPLASTY, KNEE, TOTAL (Right) Principal Problem:   S/P total knee arthroplasty, right Active Problems:   Status post total knee replacement using cement, right  Estimated body mass index is 28.87 kg/m as calculated from the following:   Height as of this encounter: 5\' 3"  (1.6 m).   Weight as of this encounter: 73.9 kg. Advance diet Up with therapy Pain well controlled Vital signs are stable + BM CM to assist with discharge to SNF.  Patient is stable and ready for discharge to SNF today   DVT Prophylaxis - Lovenox, TED hose, and SCDs Weight-Bearing as tolerated to right leg   T. Cranston Neighbor, PA-C [facility] 06/05/2023, 8:10 AM

## 2023-06-05 NOTE — Progress Notes (Signed)
 Physical Therapy Treatment Patient Details Name: Teresa Johns MRN: [id number] DOB: [birth year] Today's Date: 06/05/2023   History of Present Illness Patient is a 89 year old with right knee osteoarthritis. S/p right total knee arthroplasty. History of back surgery    PT Comments  In  chair after finishing OT session which included one lap on unit.  She is wheeled to rehab gym for energy conservation for stair training.  She climbs up./down with ease and R rail stating she has been doing it this way for a long time.  She is able to continue gait back to room without rest.  Safe and steady gait.  While pt remains hopeful for SNF, she has progressed and met all acute goals.  Discussed with TOC as SNF has again been denied.  Would encourage +1 assist from family and HHPT/OT to progress.    If plan is discharge home, recommend the following: Assistance with cooking/housework;Direct supervision/assist for medications management;Direct supervision/assist for financial management;Assist for transportation;Help with stairs or ramp for entrance;Supervision due to cognitive status;A little help with walking and/or transfers;A little help with bathing/dressing/bathroom   Can travel by private vehicle        Equipment Recommendations  Rolling walker (2 wheels);BSC/3in1    Recommendations for Other Services       Precautions / Restrictions Precautions Precautions: Fall;Knee Precaution Booklet Issued: Yes (comment) Recall of Precautions/Restrictions: Intact Restrictions Weight Bearing Restrictions Per Provider Order: Yes RLE Weight Bearing Per Provider Order: Weight bearing as tolerated     Mobility  Bed Mobility               General bed mobility comments: in recliner after OT session finishing Patient Response: Cooperative  Transfers Overall transfer level: Needs assistance Equipment used: Rolling walker (2 wheels) Transfers: Sit to/from Stand Sit to Stand: Supervision                 Ambulation/Gait Ambulation/Gait assistance: Supervision Gait Distance (Feet): 200 Feet Assistive device: Rolling walker (2 wheels)   Gait velocity: decreased         Stairs Stairs: Yes Stairs assistance: Supervision, Contact guard assist Stair Management: One rail Right, Forwards Number of Stairs: 4 General stair comments: with ease   Wheelchair Mobility     Tilt Bed Tilt Bed Patient Response: Cooperative  Modified Rankin (Stroke Patients Only)       Balance Overall balance assessment: Needs assistance Sitting-balance support: Feet supported Sitting balance-Leahy Scale: Good     Standing balance support: Bilateral upper extremity supported, During functional activity, Reliant on assistive device for balance Standing balance-Leahy Scale: Fair                              Hotel manager: No apparent difficulties  Cognition Arousal: Alert Behavior During Therapy: WFL for tasks assessed/performed   PT - Cognitive impairments: History of cognitive impairments, Awareness, Problem solving                         Following commands: Intact      Cueing Cueing Techniques: Verbal cues, Tactile cues  Exercises      General Comments        Pertinent Vitals/Pain Pain Assessment Pain Assessment: Faces Faces Pain Scale: Hurts a little bit Pain Location: knee with transitions but overall doing well Pain Descriptors / Indicators: Sore Pain Intervention(s): Limited activity within patient's tolerance, Monitored during session, Premedicated  before session    Home Living                          Prior Function            PT Goals (current goals can now be found in the care plan section) Progress towards PT goals: Progressing toward goals    Frequency    7X/week      PT Plan      Co-evaluation              AM-PAC PT "6 Clicks" Mobility   Outcome Measure  Help needed  turning from your back to your side while in a flat bed without using bedrails?: A Little Help needed moving from lying on your back to sitting on the side of a flat bed without using bedrails?: None Help needed moving to and from a bed to a chair (including a wheelchair)?: None Help needed standing up from a chair using your arms (e.g., wheelchair or bedside chair)?: None Help needed to walk in hospital room?: A Little Help needed climbing 3-5 steps with a railing? : A Little 6 Click Score: 21    End of Session Equipment Utilized During Treatment: Gait belt Activity Tolerance: Patient tolerated treatment well Patient left: in chair;with chair alarm set;with call bell/phone within reach Nurse Communication: Mobility status PT Visit Diagnosis: Difficulty in walking, not elsewhere classified (R26.2);Other abnormalities of gait and mobility (R26.89)     Time: 1324-4010 PT Time Calculation (min) (ACUTE ONLY): 11 min  Charges:    $Gait Training: 8-22 mins PT General Charges $$ ACUTE PT VISIT: 1 Visit                   Danielle Dess, PTA 06/05/23, 10:36 AM

## 2023-06-05 NOTE — Plan of Care (Signed)
  Problem: Education: Goal: Knowledge of the prescribed therapeutic regimen will improve Outcome: Progressing Goal: Individualized Educational Video(s) Outcome: Progressing   Problem: Activity: Goal: Ability to avoid complications of mobility impairment will improve Outcome: Progressing Goal: Range of joint motion will improve Outcome: Progressing   Problem: Clinical Measurements: Goal: Postoperative complications will be avoided or minimized Outcome: Progressing   Problem: Pain Management: Goal: Pain level will decrease with appropriate interventions Outcome: Progressing   Problem: Skin Integrity: Goal: Will show signs of wound healing Outcome: Progressing   Problem: Education: Goal: Knowledge of General Education information will improve Description: Including pain rating scale, medication(s)/side effects and non-pharmacologic comfort measures Outcome: Progressing   Problem: Health Behavior/Discharge Planning: Goal: Ability to manage health-related needs will improve Outcome: Progressing   Problem: Clinical Measurements: Goal: Ability to maintain clinical measurements within normal limits will improve Outcome: Progressing Goal: Will remain free from infection Outcome: Progressing Goal: Diagnostic test results will improve Outcome: Progressing Goal: Respiratory complications will improve Outcome: Progressing Goal: Cardiovascular complication will be avoided Outcome: Progressing   Problem: Activity: Goal: Risk for activity intolerance will decrease Outcome: Progressing   Problem: Nutrition: Goal: Adequate nutrition will be maintained Outcome: Progressing   Problem: Coping: Goal: Level of anxiety will decrease Outcome: Progressing   Problem: Elimination: Goal: Will not experience complications related to bowel motility Outcome: Progressing Goal: Will not experience complications related to urinary retention Outcome: Progressing   Problem: Pain  Managment: Goal: General experience of comfort will improve and/or be controlled Outcome: Progressing   Problem: Safety: Goal: Ability to remain free from injury will improve Outcome: Progressing   Problem: Skin Integrity: Goal: Risk for impaired skin integrity will decrease Outcome: Progressing

## 2023-06-05 NOTE — TOC Progression Note (Addendum)
 Transition of Care [facility]) - Progression Note    Patient Details  Name: Teresa Johns MRN: [id number] Date of Birth: 11/22/1934  Transition of Care [facility]) CM/SW Contact  Marlowe Sax, RN Phone Number: 06/05/2023, 12:00 PM  Clinical Narrative:   [facility] call from [facility] and they denied going to [place] with the son Tammy Sours they are agreeable to go home with [facility] PT, [facility] accepted the patient for [facility], she will need a 3 in1, [employer] to deliver to the bedside prior to DC   Expected Discharge Plan: Home w Home Health Services Barriers to Discharge: Barriers Resolved  Expected Discharge Plan and Services   Discharge Planning Services: CM Consult   Living arrangements for the past 2 months: Single Family Home Expected Discharge Date: 06/05/23               DME Arranged: 3-N-1 DME Agency: [employer] Date DME Agency Contacted: 06/05/23 Time DME Agency Contacted: [phone] Representative spoke with at DME Agency: Osvaldo Angst Arranged: PT HH Agency: [facility] ([facility] Date HH Agency Contacted: 06/05/23 Time HH Agency Contacted: [phone] Representative spoke with at [facility] Agency: Immunologist   Social Determinants of Health (SDOH) Interventions SDOH Screenings   Food Insecurity: No Food Insecurity (05/29/2023)  Housing: Low Risk  (05/29/2023)  Transportation Needs: No Transportation Needs (05/29/2023)  Utilities: Not At Risk (05/29/2023)  Depression (PHQ2-9): Low Risk  (01/17/2023)  Financial Resource Strain: Low Risk  (05/18/2023)   Received from [facility]  Social Connections: Socially Isolated (05/29/2023)  Tobacco Use: Low Risk  (05/22/2023)    Readmission Risk Interventions     No data to display

## 2023-06-05 NOTE — Progress Notes (Signed)
 Patient is not able to walk the distance required to go the bathroom, or he/she is unable to safely negotiate stairs required to access the bathroom.  A 3in1 BSC will alleviate this problem

## 2023-06-08 DIAGNOSIS — M1711 Unilateral primary osteoarthritis, right knee: Secondary | ICD-10-CM | POA: Diagnosis not present

## 2023-06-13 DIAGNOSIS — K219 Gastro-esophageal reflux disease without esophagitis: Secondary | ICD-10-CM | POA: Diagnosis not present

## 2023-06-13 DIAGNOSIS — J302 Other seasonal allergic rhinitis: Secondary | ICD-10-CM | POA: Diagnosis not present

## 2023-06-13 DIAGNOSIS — N2889 Other specified disorders of kidney and ureter: Secondary | ICD-10-CM | POA: Diagnosis not present

## 2023-06-13 DIAGNOSIS — N184 Chronic kidney disease, stage 4 (severe): Secondary | ICD-10-CM | POA: Diagnosis not present

## 2023-06-13 DIAGNOSIS — G629 Polyneuropathy, unspecified: Secondary | ICD-10-CM | POA: Diagnosis not present

## 2023-06-13 DIAGNOSIS — M48062 Spinal stenosis, lumbar region with neurogenic claudication: Secondary | ICD-10-CM | POA: Diagnosis not present

## 2023-06-13 DIAGNOSIS — K529 Noninfective gastroenteritis and colitis, unspecified: Secondary | ICD-10-CM | POA: Diagnosis not present

## 2023-06-13 DIAGNOSIS — Z96651 Presence of right artificial knee joint: Secondary | ICD-10-CM | POA: Diagnosis not present

## 2023-06-13 DIAGNOSIS — K449 Diaphragmatic hernia without obstruction or gangrene: Secondary | ICD-10-CM | POA: Diagnosis not present

## 2023-06-13 DIAGNOSIS — Z7982 Long term (current) use of aspirin: Secondary | ICD-10-CM | POA: Diagnosis not present

## 2023-06-13 DIAGNOSIS — E559 Vitamin D deficiency, unspecified: Secondary | ICD-10-CM | POA: Diagnosis not present

## 2023-06-13 DIAGNOSIS — Z471 Aftercare following joint replacement surgery: Secondary | ICD-10-CM | POA: Diagnosis not present

## 2023-06-13 DIAGNOSIS — M810 Age-related osteoporosis without current pathological fracture: Secondary | ICD-10-CM | POA: Diagnosis not present

## 2023-06-15 DIAGNOSIS — K529 Noninfective gastroenteritis and colitis, unspecified: Secondary | ICD-10-CM | POA: Diagnosis not present

## 2023-06-15 DIAGNOSIS — E559 Vitamin D deficiency, unspecified: Secondary | ICD-10-CM | POA: Diagnosis not present

## 2023-06-15 DIAGNOSIS — Z96651 Presence of right artificial knee joint: Secondary | ICD-10-CM | POA: Diagnosis not present

## 2023-06-15 DIAGNOSIS — J302 Other seasonal allergic rhinitis: Secondary | ICD-10-CM | POA: Diagnosis not present

## 2023-06-15 DIAGNOSIS — Z7982 Long term (current) use of aspirin: Secondary | ICD-10-CM | POA: Diagnosis not present

## 2023-06-15 DIAGNOSIS — K449 Diaphragmatic hernia without obstruction or gangrene: Secondary | ICD-10-CM | POA: Diagnosis not present

## 2023-06-15 DIAGNOSIS — M48062 Spinal stenosis, lumbar region with neurogenic claudication: Secondary | ICD-10-CM | POA: Diagnosis not present

## 2023-06-15 DIAGNOSIS — Z471 Aftercare following joint replacement surgery: Secondary | ICD-10-CM | POA: Diagnosis not present

## 2023-06-15 DIAGNOSIS — N2889 Other specified disorders of kidney and ureter: Secondary | ICD-10-CM | POA: Diagnosis not present

## 2023-06-15 DIAGNOSIS — M810 Age-related osteoporosis without current pathological fracture: Secondary | ICD-10-CM | POA: Diagnosis not present

## 2023-06-15 DIAGNOSIS — G629 Polyneuropathy, unspecified: Secondary | ICD-10-CM | POA: Diagnosis not present

## 2023-06-15 DIAGNOSIS — K219 Gastro-esophageal reflux disease without esophagitis: Secondary | ICD-10-CM | POA: Diagnosis not present

## 2023-06-15 DIAGNOSIS — N184 Chronic kidney disease, stage 4 (severe): Secondary | ICD-10-CM | POA: Diagnosis not present

## 2023-06-19 DIAGNOSIS — Z7982 Long term (current) use of aspirin: Secondary | ICD-10-CM | POA: Diagnosis not present

## 2023-06-19 DIAGNOSIS — K219 Gastro-esophageal reflux disease without esophagitis: Secondary | ICD-10-CM | POA: Diagnosis not present

## 2023-06-19 DIAGNOSIS — Z471 Aftercare following joint replacement surgery: Secondary | ICD-10-CM | POA: Diagnosis not present

## 2023-06-19 DIAGNOSIS — G629 Polyneuropathy, unspecified: Secondary | ICD-10-CM | POA: Diagnosis not present

## 2023-06-19 DIAGNOSIS — K529 Noninfective gastroenteritis and colitis, unspecified: Secondary | ICD-10-CM | POA: Diagnosis not present

## 2023-06-19 DIAGNOSIS — M810 Age-related osteoporosis without current pathological fracture: Secondary | ICD-10-CM | POA: Diagnosis not present

## 2023-06-19 DIAGNOSIS — E559 Vitamin D deficiency, unspecified: Secondary | ICD-10-CM | POA: Diagnosis not present

## 2023-06-19 DIAGNOSIS — N2889 Other specified disorders of kidney and ureter: Secondary | ICD-10-CM | POA: Diagnosis not present

## 2023-06-19 DIAGNOSIS — Z96651 Presence of right artificial knee joint: Secondary | ICD-10-CM | POA: Diagnosis not present

## 2023-06-19 DIAGNOSIS — J302 Other seasonal allergic rhinitis: Secondary | ICD-10-CM | POA: Diagnosis not present

## 2023-06-19 DIAGNOSIS — M48062 Spinal stenosis, lumbar region with neurogenic claudication: Secondary | ICD-10-CM | POA: Diagnosis not present

## 2023-06-19 DIAGNOSIS — N184 Chronic kidney disease, stage 4 (severe): Secondary | ICD-10-CM | POA: Diagnosis not present

## 2023-06-19 DIAGNOSIS — K449 Diaphragmatic hernia without obstruction or gangrene: Secondary | ICD-10-CM | POA: Diagnosis not present

## 2023-06-20 DIAGNOSIS — M48062 Spinal stenosis, lumbar region with neurogenic claudication: Secondary | ICD-10-CM | POA: Diagnosis not present

## 2023-06-20 DIAGNOSIS — Z471 Aftercare following joint replacement surgery: Secondary | ICD-10-CM | POA: Diagnosis not present

## 2023-06-20 DIAGNOSIS — J302 Other seasonal allergic rhinitis: Secondary | ICD-10-CM | POA: Diagnosis not present

## 2023-06-20 DIAGNOSIS — K449 Diaphragmatic hernia without obstruction or gangrene: Secondary | ICD-10-CM | POA: Diagnosis not present

## 2023-06-20 DIAGNOSIS — E559 Vitamin D deficiency, unspecified: Secondary | ICD-10-CM | POA: Diagnosis not present

## 2023-06-20 DIAGNOSIS — Z7982 Long term (current) use of aspirin: Secondary | ICD-10-CM | POA: Diagnosis not present

## 2023-06-20 DIAGNOSIS — K219 Gastro-esophageal reflux disease without esophagitis: Secondary | ICD-10-CM | POA: Diagnosis not present

## 2023-06-20 DIAGNOSIS — K529 Noninfective gastroenteritis and colitis, unspecified: Secondary | ICD-10-CM | POA: Diagnosis not present

## 2023-06-20 DIAGNOSIS — N184 Chronic kidney disease, stage 4 (severe): Secondary | ICD-10-CM | POA: Diagnosis not present

## 2023-06-20 DIAGNOSIS — G629 Polyneuropathy, unspecified: Secondary | ICD-10-CM | POA: Diagnosis not present

## 2023-06-20 DIAGNOSIS — M810 Age-related osteoporosis without current pathological fracture: Secondary | ICD-10-CM | POA: Diagnosis not present

## 2023-06-20 DIAGNOSIS — N2889 Other specified disorders of kidney and ureter: Secondary | ICD-10-CM | POA: Diagnosis not present

## 2023-06-20 DIAGNOSIS — Z96651 Presence of right artificial knee joint: Secondary | ICD-10-CM | POA: Diagnosis not present

## 2023-06-21 DIAGNOSIS — M48062 Spinal stenosis, lumbar region with neurogenic claudication: Secondary | ICD-10-CM | POA: Diagnosis not present

## 2023-06-21 DIAGNOSIS — G629 Polyneuropathy, unspecified: Secondary | ICD-10-CM | POA: Diagnosis not present

## 2023-06-21 DIAGNOSIS — Z471 Aftercare following joint replacement surgery: Secondary | ICD-10-CM | POA: Diagnosis not present

## 2023-06-21 DIAGNOSIS — M810 Age-related osteoporosis without current pathological fracture: Secondary | ICD-10-CM | POA: Diagnosis not present

## 2023-06-21 DIAGNOSIS — N2889 Other specified disorders of kidney and ureter: Secondary | ICD-10-CM | POA: Diagnosis not present

## 2023-06-21 DIAGNOSIS — K529 Noninfective gastroenteritis and colitis, unspecified: Secondary | ICD-10-CM | POA: Diagnosis not present

## 2023-06-21 DIAGNOSIS — K219 Gastro-esophageal reflux disease without esophagitis: Secondary | ICD-10-CM | POA: Diagnosis not present

## 2023-06-21 DIAGNOSIS — N184 Chronic kidney disease, stage 4 (severe): Secondary | ICD-10-CM | POA: Diagnosis not present

## 2023-06-21 DIAGNOSIS — E559 Vitamin D deficiency, unspecified: Secondary | ICD-10-CM | POA: Diagnosis not present

## 2023-06-21 DIAGNOSIS — Z96651 Presence of right artificial knee joint: Secondary | ICD-10-CM | POA: Diagnosis not present

## 2023-06-21 DIAGNOSIS — K449 Diaphragmatic hernia without obstruction or gangrene: Secondary | ICD-10-CM | POA: Diagnosis not present

## 2023-06-21 DIAGNOSIS — Z7982 Long term (current) use of aspirin: Secondary | ICD-10-CM | POA: Diagnosis not present

## 2023-06-21 DIAGNOSIS — J302 Other seasonal allergic rhinitis: Secondary | ICD-10-CM | POA: Diagnosis not present

## 2023-06-27 DIAGNOSIS — Z96651 Presence of right artificial knee joint: Secondary | ICD-10-CM | POA: Diagnosis not present

## 2023-06-27 DIAGNOSIS — M810 Age-related osteoporosis without current pathological fracture: Secondary | ICD-10-CM | POA: Diagnosis not present

## 2023-06-27 DIAGNOSIS — I129 Hypertensive chronic kidney disease with stage 1 through stage 4 chronic kidney disease, or unspecified chronic kidney disease: Secondary | ICD-10-CM | POA: Diagnosis not present

## 2023-06-27 DIAGNOSIS — G629 Polyneuropathy, unspecified: Secondary | ICD-10-CM | POA: Diagnosis not present

## 2023-06-27 DIAGNOSIS — E559 Vitamin D deficiency, unspecified: Secondary | ICD-10-CM | POA: Diagnosis not present

## 2023-06-27 DIAGNOSIS — M48062 Spinal stenosis, lumbar region with neurogenic claudication: Secondary | ICD-10-CM | POA: Diagnosis not present

## 2023-06-27 DIAGNOSIS — N2889 Other specified disorders of kidney and ureter: Secondary | ICD-10-CM | POA: Diagnosis not present

## 2023-06-27 DIAGNOSIS — K219 Gastro-esophageal reflux disease without esophagitis: Secondary | ICD-10-CM | POA: Diagnosis not present

## 2023-06-27 DIAGNOSIS — K449 Diaphragmatic hernia without obstruction or gangrene: Secondary | ICD-10-CM | POA: Diagnosis not present

## 2023-06-27 DIAGNOSIS — N184 Chronic kidney disease, stage 4 (severe): Secondary | ICD-10-CM | POA: Diagnosis not present

## 2023-06-27 DIAGNOSIS — K529 Noninfective gastroenteritis and colitis, unspecified: Secondary | ICD-10-CM | POA: Diagnosis not present

## 2023-06-27 DIAGNOSIS — Z471 Aftercare following joint replacement surgery: Secondary | ICD-10-CM | POA: Diagnosis not present

## 2023-06-27 DIAGNOSIS — Z7982 Long term (current) use of aspirin: Secondary | ICD-10-CM | POA: Diagnosis not present

## 2023-06-28 DIAGNOSIS — E559 Vitamin D deficiency, unspecified: Secondary | ICD-10-CM | POA: Diagnosis not present

## 2023-06-28 DIAGNOSIS — Z471 Aftercare following joint replacement surgery: Secondary | ICD-10-CM | POA: Diagnosis not present

## 2023-06-28 DIAGNOSIS — K529 Noninfective gastroenteritis and colitis, unspecified: Secondary | ICD-10-CM | POA: Diagnosis not present

## 2023-06-28 DIAGNOSIS — Z7982 Long term (current) use of aspirin: Secondary | ICD-10-CM | POA: Diagnosis not present

## 2023-06-28 DIAGNOSIS — I129 Hypertensive chronic kidney disease with stage 1 through stage 4 chronic kidney disease, or unspecified chronic kidney disease: Secondary | ICD-10-CM | POA: Diagnosis not present

## 2023-06-28 DIAGNOSIS — Z96651 Presence of right artificial knee joint: Secondary | ICD-10-CM | POA: Diagnosis not present

## 2023-06-28 DIAGNOSIS — N2889 Other specified disorders of kidney and ureter: Secondary | ICD-10-CM | POA: Diagnosis not present

## 2023-06-28 DIAGNOSIS — K219 Gastro-esophageal reflux disease without esophagitis: Secondary | ICD-10-CM | POA: Diagnosis not present

## 2023-06-28 DIAGNOSIS — N184 Chronic kidney disease, stage 4 (severe): Secondary | ICD-10-CM | POA: Diagnosis not present

## 2023-06-28 DIAGNOSIS — K449 Diaphragmatic hernia without obstruction or gangrene: Secondary | ICD-10-CM | POA: Diagnosis not present

## 2023-06-28 DIAGNOSIS — M48062 Spinal stenosis, lumbar region with neurogenic claudication: Secondary | ICD-10-CM | POA: Diagnosis not present

## 2023-06-28 DIAGNOSIS — G629 Polyneuropathy, unspecified: Secondary | ICD-10-CM | POA: Diagnosis not present

## 2023-06-28 DIAGNOSIS — M810 Age-related osteoporosis without current pathological fracture: Secondary | ICD-10-CM | POA: Diagnosis not present

## 2023-06-29 DIAGNOSIS — M48062 Spinal stenosis, lumbar region with neurogenic claudication: Secondary | ICD-10-CM | POA: Diagnosis not present

## 2023-06-29 DIAGNOSIS — I129 Hypertensive chronic kidney disease with stage 1 through stage 4 chronic kidney disease, or unspecified chronic kidney disease: Secondary | ICD-10-CM | POA: Diagnosis not present

## 2023-06-29 DIAGNOSIS — G629 Polyneuropathy, unspecified: Secondary | ICD-10-CM | POA: Diagnosis not present

## 2023-06-29 DIAGNOSIS — E559 Vitamin D deficiency, unspecified: Secondary | ICD-10-CM | POA: Diagnosis not present

## 2023-06-29 DIAGNOSIS — Z7982 Long term (current) use of aspirin: Secondary | ICD-10-CM | POA: Diagnosis not present

## 2023-06-29 DIAGNOSIS — M810 Age-related osteoporosis without current pathological fracture: Secondary | ICD-10-CM | POA: Diagnosis not present

## 2023-06-29 DIAGNOSIS — Z96651 Presence of right artificial knee joint: Secondary | ICD-10-CM | POA: Diagnosis not present

## 2023-06-29 DIAGNOSIS — K219 Gastro-esophageal reflux disease without esophagitis: Secondary | ICD-10-CM | POA: Diagnosis not present

## 2023-06-29 DIAGNOSIS — K449 Diaphragmatic hernia without obstruction or gangrene: Secondary | ICD-10-CM | POA: Diagnosis not present

## 2023-06-29 DIAGNOSIS — N2889 Other specified disorders of kidney and ureter: Secondary | ICD-10-CM | POA: Diagnosis not present

## 2023-06-29 DIAGNOSIS — K529 Noninfective gastroenteritis and colitis, unspecified: Secondary | ICD-10-CM | POA: Diagnosis not present

## 2023-06-29 DIAGNOSIS — Z471 Aftercare following joint replacement surgery: Secondary | ICD-10-CM | POA: Diagnosis not present

## 2023-06-29 DIAGNOSIS — N184 Chronic kidney disease, stage 4 (severe): Secondary | ICD-10-CM | POA: Diagnosis not present

## 2023-07-03 DIAGNOSIS — E559 Vitamin D deficiency, unspecified: Secondary | ICD-10-CM | POA: Diagnosis not present

## 2023-07-03 DIAGNOSIS — K529 Noninfective gastroenteritis and colitis, unspecified: Secondary | ICD-10-CM | POA: Diagnosis not present

## 2023-07-03 DIAGNOSIS — K219 Gastro-esophageal reflux disease without esophagitis: Secondary | ICD-10-CM | POA: Diagnosis not present

## 2023-07-03 DIAGNOSIS — G629 Polyneuropathy, unspecified: Secondary | ICD-10-CM | POA: Diagnosis not present

## 2023-07-03 DIAGNOSIS — N184 Chronic kidney disease, stage 4 (severe): Secondary | ICD-10-CM | POA: Diagnosis not present

## 2023-07-03 DIAGNOSIS — K449 Diaphragmatic hernia without obstruction or gangrene: Secondary | ICD-10-CM | POA: Diagnosis not present

## 2023-07-03 DIAGNOSIS — Z7982 Long term (current) use of aspirin: Secondary | ICD-10-CM | POA: Diagnosis not present

## 2023-07-03 DIAGNOSIS — Z471 Aftercare following joint replacement surgery: Secondary | ICD-10-CM | POA: Diagnosis not present

## 2023-07-03 DIAGNOSIS — M810 Age-related osteoporosis without current pathological fracture: Secondary | ICD-10-CM | POA: Diagnosis not present

## 2023-07-03 DIAGNOSIS — I129 Hypertensive chronic kidney disease with stage 1 through stage 4 chronic kidney disease, or unspecified chronic kidney disease: Secondary | ICD-10-CM | POA: Diagnosis not present

## 2023-07-03 DIAGNOSIS — Z96651 Presence of right artificial knee joint: Secondary | ICD-10-CM | POA: Diagnosis not present

## 2023-07-03 DIAGNOSIS — N2889 Other specified disorders of kidney and ureter: Secondary | ICD-10-CM | POA: Diagnosis not present

## 2023-07-03 DIAGNOSIS — M48062 Spinal stenosis, lumbar region with neurogenic claudication: Secondary | ICD-10-CM | POA: Diagnosis not present

## 2023-07-04 DIAGNOSIS — Z96651 Presence of right artificial knee joint: Secondary | ICD-10-CM | POA: Diagnosis not present

## 2023-07-07 DIAGNOSIS — M48062 Spinal stenosis, lumbar region with neurogenic claudication: Secondary | ICD-10-CM | POA: Diagnosis not present

## 2023-07-07 DIAGNOSIS — N184 Chronic kidney disease, stage 4 (severe): Secondary | ICD-10-CM | POA: Diagnosis not present

## 2023-07-07 DIAGNOSIS — Z7982 Long term (current) use of aspirin: Secondary | ICD-10-CM | POA: Diagnosis not present

## 2023-07-07 DIAGNOSIS — M19011 Primary osteoarthritis, right shoulder: Secondary | ICD-10-CM | POA: Diagnosis not present

## 2023-07-07 DIAGNOSIS — I129 Hypertensive chronic kidney disease with stage 1 through stage 4 chronic kidney disease, or unspecified chronic kidney disease: Secondary | ICD-10-CM | POA: Diagnosis not present

## 2023-07-07 DIAGNOSIS — K219 Gastro-esophageal reflux disease without esophagitis: Secondary | ICD-10-CM | POA: Diagnosis not present

## 2023-07-07 DIAGNOSIS — M810 Age-related osteoporosis without current pathological fracture: Secondary | ICD-10-CM | POA: Diagnosis not present

## 2023-07-07 DIAGNOSIS — E559 Vitamin D deficiency, unspecified: Secondary | ICD-10-CM | POA: Diagnosis not present

## 2023-07-07 DIAGNOSIS — Z471 Aftercare following joint replacement surgery: Secondary | ICD-10-CM | POA: Diagnosis not present

## 2023-07-07 DIAGNOSIS — N2889 Other specified disorders of kidney and ureter: Secondary | ICD-10-CM | POA: Diagnosis not present

## 2023-07-07 DIAGNOSIS — G629 Polyneuropathy, unspecified: Secondary | ICD-10-CM | POA: Diagnosis not present

## 2023-07-07 DIAGNOSIS — K449 Diaphragmatic hernia without obstruction or gangrene: Secondary | ICD-10-CM | POA: Diagnosis not present

## 2023-07-07 DIAGNOSIS — K529 Noninfective gastroenteritis and colitis, unspecified: Secondary | ICD-10-CM | POA: Diagnosis not present

## 2023-07-07 DIAGNOSIS — Z96651 Presence of right artificial knee joint: Secondary | ICD-10-CM | POA: Diagnosis not present

## 2023-07-10 DIAGNOSIS — I129 Hypertensive chronic kidney disease with stage 1 through stage 4 chronic kidney disease, or unspecified chronic kidney disease: Secondary | ICD-10-CM | POA: Diagnosis not present

## 2023-07-10 DIAGNOSIS — M48062 Spinal stenosis, lumbar region with neurogenic claudication: Secondary | ICD-10-CM | POA: Diagnosis not present

## 2023-07-10 DIAGNOSIS — K219 Gastro-esophageal reflux disease without esophagitis: Secondary | ICD-10-CM | POA: Diagnosis not present

## 2023-07-10 DIAGNOSIS — N184 Chronic kidney disease, stage 4 (severe): Secondary | ICD-10-CM | POA: Diagnosis not present

## 2023-07-10 DIAGNOSIS — Z471 Aftercare following joint replacement surgery: Secondary | ICD-10-CM | POA: Diagnosis not present

## 2023-07-10 DIAGNOSIS — Z96651 Presence of right artificial knee joint: Secondary | ICD-10-CM | POA: Diagnosis not present

## 2023-07-10 DIAGNOSIS — K449 Diaphragmatic hernia without obstruction or gangrene: Secondary | ICD-10-CM | POA: Diagnosis not present

## 2023-07-10 DIAGNOSIS — E559 Vitamin D deficiency, unspecified: Secondary | ICD-10-CM | POA: Diagnosis not present

## 2023-07-10 DIAGNOSIS — K529 Noninfective gastroenteritis and colitis, unspecified: Secondary | ICD-10-CM | POA: Diagnosis not present

## 2023-07-10 DIAGNOSIS — M810 Age-related osteoporosis without current pathological fracture: Secondary | ICD-10-CM | POA: Diagnosis not present

## 2023-07-10 DIAGNOSIS — G629 Polyneuropathy, unspecified: Secondary | ICD-10-CM | POA: Diagnosis not present

## 2023-07-10 DIAGNOSIS — N2889 Other specified disorders of kidney and ureter: Secondary | ICD-10-CM | POA: Diagnosis not present

## 2023-07-10 DIAGNOSIS — Z7982 Long term (current) use of aspirin: Secondary | ICD-10-CM | POA: Diagnosis not present

## 2023-07-11 DIAGNOSIS — G629 Polyneuropathy, unspecified: Secondary | ICD-10-CM | POA: Diagnosis not present

## 2023-07-11 DIAGNOSIS — K529 Noninfective gastroenteritis and colitis, unspecified: Secondary | ICD-10-CM | POA: Diagnosis not present

## 2023-07-11 DIAGNOSIS — N2889 Other specified disorders of kidney and ureter: Secondary | ICD-10-CM | POA: Diagnosis not present

## 2023-07-11 DIAGNOSIS — M810 Age-related osteoporosis without current pathological fracture: Secondary | ICD-10-CM | POA: Diagnosis not present

## 2023-07-11 DIAGNOSIS — I129 Hypertensive chronic kidney disease with stage 1 through stage 4 chronic kidney disease, or unspecified chronic kidney disease: Secondary | ICD-10-CM | POA: Diagnosis not present

## 2023-07-11 DIAGNOSIS — K449 Diaphragmatic hernia without obstruction or gangrene: Secondary | ICD-10-CM | POA: Diagnosis not present

## 2023-07-11 DIAGNOSIS — N184 Chronic kidney disease, stage 4 (severe): Secondary | ICD-10-CM | POA: Diagnosis not present

## 2023-07-11 DIAGNOSIS — Z471 Aftercare following joint replacement surgery: Secondary | ICD-10-CM | POA: Diagnosis not present

## 2023-07-11 DIAGNOSIS — K219 Gastro-esophageal reflux disease without esophagitis: Secondary | ICD-10-CM | POA: Diagnosis not present

## 2023-07-11 DIAGNOSIS — E559 Vitamin D deficiency, unspecified: Secondary | ICD-10-CM | POA: Diagnosis not present

## 2023-07-11 DIAGNOSIS — M48062 Spinal stenosis, lumbar region with neurogenic claudication: Secondary | ICD-10-CM | POA: Diagnosis not present

## 2023-07-11 DIAGNOSIS — Z96651 Presence of right artificial knee joint: Secondary | ICD-10-CM | POA: Diagnosis not present

## 2023-07-11 DIAGNOSIS — Z7982 Long term (current) use of aspirin: Secondary | ICD-10-CM | POA: Diagnosis not present

## 2023-07-12 DIAGNOSIS — N184 Chronic kidney disease, stage 4 (severe): Secondary | ICD-10-CM | POA: Diagnosis not present

## 2023-07-12 DIAGNOSIS — K219 Gastro-esophageal reflux disease without esophagitis: Secondary | ICD-10-CM | POA: Diagnosis not present

## 2023-07-12 DIAGNOSIS — I129 Hypertensive chronic kidney disease with stage 1 through stage 4 chronic kidney disease, or unspecified chronic kidney disease: Secondary | ICD-10-CM | POA: Diagnosis not present

## 2023-07-12 DIAGNOSIS — Z7982 Long term (current) use of aspirin: Secondary | ICD-10-CM | POA: Diagnosis not present

## 2023-07-12 DIAGNOSIS — N2889 Other specified disorders of kidney and ureter: Secondary | ICD-10-CM | POA: Diagnosis not present

## 2023-07-12 DIAGNOSIS — K449 Diaphragmatic hernia without obstruction or gangrene: Secondary | ICD-10-CM | POA: Diagnosis not present

## 2023-07-12 DIAGNOSIS — M810 Age-related osteoporosis without current pathological fracture: Secondary | ICD-10-CM | POA: Diagnosis not present

## 2023-07-12 DIAGNOSIS — Z471 Aftercare following joint replacement surgery: Secondary | ICD-10-CM | POA: Diagnosis not present

## 2023-07-12 DIAGNOSIS — Z96651 Presence of right artificial knee joint: Secondary | ICD-10-CM | POA: Diagnosis not present

## 2023-07-12 DIAGNOSIS — G629 Polyneuropathy, unspecified: Secondary | ICD-10-CM | POA: Diagnosis not present

## 2023-07-12 DIAGNOSIS — M48062 Spinal stenosis, lumbar region with neurogenic claudication: Secondary | ICD-10-CM | POA: Diagnosis not present

## 2023-07-12 DIAGNOSIS — E559 Vitamin D deficiency, unspecified: Secondary | ICD-10-CM | POA: Diagnosis not present

## 2023-07-12 DIAGNOSIS — K529 Noninfective gastroenteritis and colitis, unspecified: Secondary | ICD-10-CM | POA: Diagnosis not present

## 2023-07-14 ENCOUNTER — Ambulatory Visit
Admission: RE | Admit: 2023-07-14 | Discharge: 2023-07-14 | Disposition: A | Discharge status: Home / Self Care | Source: Ambulatory Visit | Attending: Urology | Admitting: Urology

## 2023-07-14 DIAGNOSIS — K8020 Calculus of gallbladder without cholecystitis without obstruction: Secondary | ICD-10-CM | POA: Diagnosis not present

## 2023-07-14 DIAGNOSIS — D1803 Hemangioma of intra-abdominal structures: Secondary | ICD-10-CM | POA: Diagnosis not present

## 2023-07-14 DIAGNOSIS — N289 Disorder of kidney and ureter, unspecified: Secondary | ICD-10-CM | POA: Insufficient documentation

## 2023-07-14 DIAGNOSIS — K7689 Other specified diseases of liver: Secondary | ICD-10-CM | POA: Diagnosis not present

## 2023-07-14 DIAGNOSIS — N281 Cyst of kidney, acquired: Secondary | ICD-10-CM | POA: Diagnosis not present

## 2023-07-14 LAB — POCT I-STAT CREATININE: Creatinine, Ser: 1.6 mg/dL — ABNORMAL HIGH (ref 0.44–1.00)

## 2023-07-14 MED ORDER — IOHEXOL 300 MG/ML  SOLN
100.0000 mL | Freq: Once | INTRAMUSCULAR | Status: AC | PRN
  Administered 2023-07-14: 100 mL via INTRAVENOUS

## 2023-07-17 DIAGNOSIS — M48062 Spinal stenosis, lumbar region with neurogenic claudication: Secondary | ICD-10-CM | POA: Diagnosis not present

## 2023-07-17 DIAGNOSIS — Z96651 Presence of right artificial knee joint: Secondary | ICD-10-CM | POA: Diagnosis not present

## 2023-07-17 DIAGNOSIS — Z471 Aftercare following joint replacement surgery: Secondary | ICD-10-CM | POA: Diagnosis not present

## 2023-07-17 DIAGNOSIS — K219 Gastro-esophageal reflux disease without esophagitis: Secondary | ICD-10-CM | POA: Diagnosis not present

## 2023-07-17 DIAGNOSIS — E559 Vitamin D deficiency, unspecified: Secondary | ICD-10-CM | POA: Diagnosis not present

## 2023-07-17 DIAGNOSIS — M810 Age-related osteoporosis without current pathological fracture: Secondary | ICD-10-CM | POA: Diagnosis not present

## 2023-07-17 DIAGNOSIS — K449 Diaphragmatic hernia without obstruction or gangrene: Secondary | ICD-10-CM | POA: Diagnosis not present

## 2023-07-17 DIAGNOSIS — K529 Noninfective gastroenteritis and colitis, unspecified: Secondary | ICD-10-CM | POA: Diagnosis not present

## 2023-07-17 DIAGNOSIS — I129 Hypertensive chronic kidney disease with stage 1 through stage 4 chronic kidney disease, or unspecified chronic kidney disease: Secondary | ICD-10-CM | POA: Diagnosis not present

## 2023-07-17 DIAGNOSIS — N2889 Other specified disorders of kidney and ureter: Secondary | ICD-10-CM | POA: Diagnosis not present

## 2023-07-17 DIAGNOSIS — G629 Polyneuropathy, unspecified: Secondary | ICD-10-CM | POA: Diagnosis not present

## 2023-07-17 DIAGNOSIS — N184 Chronic kidney disease, stage 4 (severe): Secondary | ICD-10-CM | POA: Diagnosis not present

## 2023-07-17 DIAGNOSIS — Z7982 Long term (current) use of aspirin: Secondary | ICD-10-CM | POA: Diagnosis not present

## 2023-08-07 ENCOUNTER — Ambulatory Visit: Payer: No Typology Code available for payment source | Admitting: Urology

## 2023-08-09 ENCOUNTER — Encounter: Payer: Self-pay | Admitting: Urology

## 2023-08-09 ENCOUNTER — Ambulatory Visit (INDEPENDENT_AMBULATORY_CARE_PROVIDER_SITE_OTHER): Payer: Self-pay | Admitting: Urology

## 2023-08-09 VITALS — BP 120/65 | HR 96 | Ht 60.0 in | Wt 163.0 lb

## 2023-08-09 DIAGNOSIS — N2889 Other specified disorders of kidney and ureter: Secondary | ICD-10-CM

## 2023-08-09 DIAGNOSIS — Z6829 Body mass index (BMI) 29.0-29.9, adult: Secondary | ICD-10-CM | POA: Diagnosis not present

## 2023-08-09 DIAGNOSIS — N289 Disorder of kidney and ureter, unspecified: Secondary | ICD-10-CM | POA: Diagnosis not present

## 2023-08-09 DIAGNOSIS — M48061 Spinal stenosis, lumbar region without neurogenic claudication: Secondary | ICD-10-CM | POA: Diagnosis not present

## 2023-08-09 DIAGNOSIS — I10 Essential (primary) hypertension: Secondary | ICD-10-CM | POA: Diagnosis not present

## 2023-08-09 DIAGNOSIS — G500 Trigeminal neuralgia: Secondary | ICD-10-CM | POA: Diagnosis not present

## 2023-08-09 DIAGNOSIS — Z96651 Presence of right artificial knee joint: Secondary | ICD-10-CM | POA: Diagnosis not present

## 2023-08-09 NOTE — Patient Instructions (Signed)
[phone].    

## 2023-08-09 NOTE — Progress Notes (Signed)
 08/09/2023 1:19 PM   Teresa Johns 29-Jul-1934 [id number]  Referring provider: Antonio Baumgarten, MD [address] [facility] [place],  [place] [address]  Chief Complaint  Patient presents with   Results   Urologic history: 1.  Left renal mass 1.2 x 1.0 cm enhancing left renal mass on MRI November 2024 Elected active surveillance   HPI: Teresa Johns is a 89 y.o. female presents for 40-month follow-up visit.  Doing well since last visit and underwent right total knee replacement March 2025 Has progressed from walker to a cane.  Has had some increased nocturia and urge incontinence Follow-up CT performed 07/14/2023 remarkable for a stable 1.3 x 1.1 cm enhancing left lower pole renal mass. No flank/abdominal pain Denies gross hematuria  PMH: Past Medical History:  Diagnosis Date   Adrenal adenoma, left    benign   Anemia    CKD (chronic kidney disease) stage 4, GFR 15-29 ml/min (HCC)    Colitis    Complication of anesthesia    bp dropped during EGD/during hand surgery bp became elevated   GERD (gastroesophageal reflux disease)    Helicobacter pylori gastritis    Hiatal hernia    History of chicken pox    History of Helicobacter pylori infection    History of rectal bleeding    Left renal mass    Low vitamin D level    Neuropathy    Osteoporosis    Presbyesophagus    Primary hypertension    Primary osteoarthritis involving multiple joints    Renal insufficiency    Seasonal allergies    Spinal stenosis of lumbar region    Tinnitus     Surgical History: Past Surgical History:  Procedure Laterality Date   COLONOSCOPY     several   ESOPHAGOGASTRODUODENOSCOPY N/A 11/20/2020   Procedure: ESOPHAGOGASTRODUODENOSCOPY (EGD);  Surgeon: Quintin Buckle, DO;  [facility] END[facility];  Service: Gastroenterology;  Laterality: N/A;   GANGLION CYST EXCISION Right    index finger   LUMBAR LAMINECTOMY/DECOMPRESSION MICRODISCECTOMY N/A 01/25/2023    Procedure: L2-S1 DECOMPRESSION;  Surgeon: Jodeen Munch, MD;  Location: [facility] ORS;  Service: Neurosurgery;  Laterality: N/A;   PAROTID STONE REMOVAL     ROTATOR CUFF TEAR SURGERY Right    TOTAL KNEE ARTHROPLASTY Right 05/22/2023   Procedure: ARTHROPLASTY, KNEE, TOTAL;  Surgeon: Venus Ginsberg, MD;  Location: [facility] ORS;  Service: Orthopedics;  Laterality: Right;   VAGINAL HYSTERECTOMY     partial    Home Medications:  Allergies as of 08/09/2023   No Known Allergies      Medication List        Accurate as of August 09, 2023  1:19 PM. If you have any questions, ask your nurse or doctor.          acetaminophen  500 MG tablet Commonly known as: TYLENOL  Take 2 tablets (1,000 mg total) by mouth every 8 (eight) hours.   amLODipine  10 MG tablet Commonly known as: NORVASC  Take 10 mg by mouth every morning.   bisacodyl  10 MG suppository Commonly known as: DULCOLAX Place 1 suppository (10 mg total) rectally daily as needed for moderate constipation.   bismuth  subsalicylate 262 MG chewable tablet Commonly known as: PEPTO BISMOL Chew 262 mg by mouth as needed for indigestion or diarrhea or loose stools.   cyanocobalamin  1000 MCG tablet Commonly known as: VITAMIN B12 Take 1,000 mcg by mouth daily.   diphenhydramine -acetaminophen  25-500 MG Tabs tablet Commonly known as: TYLENOL  PM Take 1 tablet  by mouth at bedtime as needed (sleep).   docusate sodium  100 MG capsule Commonly known as: COLACE Take 1 capsule (100 mg total) by mouth 2 (two) times daily.   enoxaparin  30 MG/0.3ML injection Commonly known as: LOVENOX  Inject 0.3 mLs (30 mg total) into the skin daily for 7 days. Start aspirin  81 mg BID x 3 weeks after completion of lovenox    gabapentin  300 MG capsule Commonly known as: NEURONTIN  Take 300 mg by mouth 3 (three) times daily.   loratadine  10 MG tablet Commonly known as: CLARITIN  Take 10 mg by mouth daily as needed for allergies.   losartan  50 MG tablet Commonly  known as: COZAAR  Take 50 mg by mouth every morning.   multivitamin with minerals tablet Take 1 tablet by mouth daily. With omega 3 gummy   naphazoline-glycerin  0.012-0.25 % Soln Commonly known as: CLEAR EYES REDNESS Place 1-2 drops into both eyes daily as needed for eye irritation.   ondansetron  4 MG tablet Commonly known as: ZOFRAN  Take 1 tablet (4 mg total) by mouth every 6 (six) hours as needed for nausea.   oxyCODONE  5 MG immediate release tablet Commonly known as: Roxicodone  Take 0.5 tablets (2.5 mg total) by mouth every 8 (eight) hours as needed for breakthrough pain.   pantoprazole  40 MG tablet Commonly known as: PROTONIX  Take 40 mg by mouth every morning.   senna 8.6 MG Tabs tablet Commonly known as: SENOKOT Take 1 tablet (8.6 mg total) by mouth 2 (two) times daily as needed for mild constipation.   traMADol  50 MG tablet Commonly known as: ULTRAM  Take 1 tablet (50 mg total) by mouth every 6 (six) hours as needed for moderate pain (pain score 4-6).   triamterene -hydrochlorothiazide  37.5-25 MG capsule Commonly known as: DYAZIDE  Take 1 capsule by mouth every morning.        Allergies: No Known Allergies  Family History: Family History  Problem Relation Age of Onset   Heart disease Mother    Lymphoma Father    Breast cancer Maternal Aunt 41    Social History:  reports that she has never smoked. She has never used smokeless tobacco. She reports that she does not drink alcohol and does not use drugs.   Physical Exam: BP 120/65   Pulse 96   Ht 5' (1.524 m)   Wt 163 lb (73.9 kg)   BMI 31.83 kg/m   Constitutional:  Alert and oriented, No acute distress. HEENT: [place] AT Respiratory: Normal respiratory effort, no increased work of breathing. Psychiatric: Normal mood and affect.   Pertinent Imaging: CT images were personally reviewed and interpreted and agree with radiology interpretation   Assessment & Plan:    1.  Left renal mass Enhancing left renal  mass which is stable She has elected to continue active surveillance and will schedule follow-up abdominal CT with/without contrast 1 year with follow-up appointment   Geraline Knapp, MD  [facility] [address], [address] [place], [place] [address] [phone]

## 2023-09-12 NOTE — Progress Notes (Signed)
 [facility] Quality Team Note  Name: Teresa Johns Date of Birth: 23-Oct-1934 MRN: [id number] Date: 09/12/2023  [facility] Quality Team has reviewed this patient's chart, please see recommendations below:  [facility] Quality Other; (Chart reviewed for TRC measure. Only NIA abstracted at this time)

## 2023-09-27 DIAGNOSIS — E538 Deficiency of other specified B group vitamins: Secondary | ICD-10-CM | POA: Diagnosis not present

## 2023-09-27 DIAGNOSIS — G629 Polyneuropathy, unspecified: Secondary | ICD-10-CM | POA: Diagnosis not present

## 2023-09-27 DIAGNOSIS — G500 Trigeminal neuralgia: Secondary | ICD-10-CM | POA: Diagnosis not present

## 2023-12-08 DIAGNOSIS — N289 Disorder of kidney and ureter, unspecified: Secondary | ICD-10-CM | POA: Diagnosis not present

## 2023-12-08 DIAGNOSIS — Z23 Encounter for immunization: Secondary | ICD-10-CM | POA: Diagnosis not present

## 2023-12-08 DIAGNOSIS — I10 Essential (primary) hypertension: Secondary | ICD-10-CM | POA: Diagnosis not present

## 2023-12-08 DIAGNOSIS — G500 Trigeminal neuralgia: Secondary | ICD-10-CM | POA: Diagnosis not present

## 2023-12-08 DIAGNOSIS — J302 Other seasonal allergic rhinitis: Secondary | ICD-10-CM | POA: Diagnosis not present

## 2024-01-10 NOTE — Progress Notes (Signed)
 Teresa Johns                                          MRN: [id number]   01/10/2024   The [facility] Quality Team Specialist reviewed this patient medical record for the purposes of chart review for care gap closure. The following were reviewed: abstraction for care gap closure-transition of care:notification of admission.    [facility] Quality Team

## 2024-08-06 ENCOUNTER — Other Ambulatory Visit

## 2024-08-08 ENCOUNTER — Ambulatory Visit: Admitting: Urology
